# Patient Record
Sex: Female | Born: 1945 | Race: White | Hispanic: No | Marital: Single | Smoking: Never smoker
Health system: Southern US, Community
[De-identification: ages and names within clinical notes are randomized; demographics above are authoritative.]

## PROBLEM LIST (undated history)

## (undated) DIAGNOSIS — G8220 Paraplegia, unspecified: Secondary | ICD-10-CM

## (undated) DIAGNOSIS — I10 Essential (primary) hypertension: Secondary | ICD-10-CM

## (undated) DIAGNOSIS — G709 Myoneural disorder, unspecified: Secondary | ICD-10-CM

## (undated) DIAGNOSIS — Z92241 Personal history of systemic steroid therapy: Secondary | ICD-10-CM

## (undated) DIAGNOSIS — R51 Headache: Secondary | ICD-10-CM

## (undated) DIAGNOSIS — R52 Pain, unspecified: Secondary | ICD-10-CM

## (undated) DIAGNOSIS — R519 Headache, unspecified: Secondary | ICD-10-CM

## (undated) DIAGNOSIS — A809 Acute poliomyelitis, unspecified: Secondary | ICD-10-CM

## (undated) HISTORY — PX: ABDOMINAL HYSTERECTOMY: SHX81

## (undated) HISTORY — PX: LEG TENDON SURGERY: SHX1004

---

## 1998-12-05 ENCOUNTER — Emergency Department (HOSPITAL_COMMUNITY): Admission: EM | Admit: 1998-12-05 | Discharge: 1998-12-05 | Payer: Self-pay | Admitting: Emergency Medicine

## 1998-12-06 ENCOUNTER — Encounter: Payer: Self-pay | Admitting: Emergency Medicine

## 2002-10-28 ENCOUNTER — Emergency Department (HOSPITAL_COMMUNITY): Admission: EM | Admit: 2002-10-28 | Discharge: 2002-10-28 | Payer: Self-pay | Admitting: Emergency Medicine

## 2003-03-02 ENCOUNTER — Emergency Department (HOSPITAL_COMMUNITY): Admission: EM | Admit: 2003-03-02 | Discharge: 2003-03-02 | Payer: Self-pay | Admitting: Emergency Medicine

## 2003-12-27 ENCOUNTER — Emergency Department (HOSPITAL_COMMUNITY): Admission: EM | Admit: 2003-12-27 | Discharge: 2003-12-27 | Payer: Self-pay | Admitting: Emergency Medicine

## 2004-03-30 ENCOUNTER — Encounter: Admission: RE | Admit: 2004-03-30 | Discharge: 2004-06-28 | Payer: Self-pay | Admitting: Orthopedic Surgery

## 2006-03-27 ENCOUNTER — Encounter: Admission: RE | Admit: 2006-03-27 | Discharge: 2006-04-13 | Payer: Self-pay | Admitting: Geriatric Medicine

## 2006-11-13 ENCOUNTER — Encounter: Admission: RE | Admit: 2006-11-13 | Discharge: 2006-11-13 | Payer: Self-pay | Admitting: Geriatric Medicine

## 2007-11-15 ENCOUNTER — Encounter: Admission: RE | Admit: 2007-11-15 | Discharge: 2007-11-15 | Payer: Self-pay | Admitting: Geriatric Medicine

## 2008-11-17 ENCOUNTER — Encounter: Admission: RE | Admit: 2008-11-17 | Discharge: 2008-11-17 | Payer: Self-pay | Admitting: Geriatric Medicine

## 2009-11-18 ENCOUNTER — Encounter: Admission: RE | Admit: 2009-11-18 | Discharge: 2009-11-18 | Payer: Self-pay | Admitting: Geriatric Medicine

## 2010-10-08 ENCOUNTER — Other Ambulatory Visit: Payer: Self-pay | Admitting: Geriatric Medicine

## 2010-10-08 DIAGNOSIS — Z1231 Encounter for screening mammogram for malignant neoplasm of breast: Secondary | ICD-10-CM

## 2010-11-22 ENCOUNTER — Ambulatory Visit
Admission: RE | Admit: 2010-11-22 | Discharge: 2010-11-22 | Disposition: A | Discharge status: Home / Self Care | Payer: Medicare Other | Source: Ambulatory Visit | Attending: Geriatric Medicine | Admitting: Geriatric Medicine

## 2010-11-22 DIAGNOSIS — Z1231 Encounter for screening mammogram for malignant neoplasm of breast: Secondary | ICD-10-CM

## 2010-11-29 ENCOUNTER — Other Ambulatory Visit: Payer: Self-pay | Admitting: Geriatric Medicine

## 2010-11-29 DIAGNOSIS — Z1231 Encounter for screening mammogram for malignant neoplasm of breast: Secondary | ICD-10-CM

## 2011-11-23 ENCOUNTER — Ambulatory Visit
Admission: RE | Admit: 2011-11-23 | Discharge: 2011-11-23 | Disposition: A | Discharge status: Home / Self Care | Payer: Medicare Other | Source: Ambulatory Visit | Attending: Geriatric Medicine | Admitting: Geriatric Medicine

## 2011-11-23 DIAGNOSIS — Z1231 Encounter for screening mammogram for malignant neoplasm of breast: Secondary | ICD-10-CM

## 2012-01-16 ENCOUNTER — Other Ambulatory Visit: Payer: Self-pay | Admitting: Geriatric Medicine

## 2012-01-16 DIAGNOSIS — Z1231 Encounter for screening mammogram for malignant neoplasm of breast: Secondary | ICD-10-CM

## 2012-11-23 ENCOUNTER — Ambulatory Visit: Payer: Medicare Other

## 2012-12-07 ENCOUNTER — Ambulatory Visit
Admission: RE | Admit: 2012-12-07 | Discharge: 2012-12-07 | Disposition: A | Discharge status: Home / Self Care | Payer: Medicare Other | Source: Ambulatory Visit | Attending: Geriatric Medicine | Admitting: Geriatric Medicine

## 2012-12-07 DIAGNOSIS — Z1231 Encounter for screening mammogram for malignant neoplasm of breast: Secondary | ICD-10-CM

## 2013-04-08 ENCOUNTER — Other Ambulatory Visit: Payer: Self-pay

## 2013-04-08 DIAGNOSIS — Z1231 Encounter for screening mammogram for malignant neoplasm of breast: Secondary | ICD-10-CM

## 2013-11-13 ENCOUNTER — Emergency Department (HOSPITAL_COMMUNITY): Payer: Medicare HMO

## 2013-11-13 ENCOUNTER — Emergency Department (HOSPITAL_COMMUNITY)
Admission: EM | Admit: 2013-11-13 | Discharge: 2013-11-13 | Disposition: A | Discharge status: Home / Self Care | Payer: Medicare HMO | Attending: Emergency Medicine | Admitting: Emergency Medicine

## 2013-11-13 ENCOUNTER — Encounter (HOSPITAL_COMMUNITY): Payer: Self-pay | Admitting: Emergency Medicine

## 2013-11-13 DIAGNOSIS — R160 Hepatomegaly, not elsewhere classified: Secondary | ICD-10-CM | POA: Diagnosis not present

## 2013-11-13 DIAGNOSIS — Z8612 Personal history of poliomyelitis: Secondary | ICD-10-CM | POA: Insufficient documentation

## 2013-11-13 DIAGNOSIS — R109 Unspecified abdominal pain: Secondary | ICD-10-CM | POA: Diagnosis present

## 2013-11-13 DIAGNOSIS — N289 Disorder of kidney and ureter, unspecified: Secondary | ICD-10-CM | POA: Diagnosis not present

## 2013-11-13 DIAGNOSIS — N2889 Other specified disorders of kidney and ureter: Secondary | ICD-10-CM

## 2013-11-13 HISTORY — DX: Paraplegia, unspecified: G82.20

## 2013-11-13 HISTORY — DX: Acute poliomyelitis, unspecified: A80.9

## 2013-11-13 LAB — URINALYSIS, ROUTINE W REFLEX MICROSCOPIC
GLUCOSE, UA: NEGATIVE mg/dL
KETONES UR: 15 mg/dL — AB
Nitrite: NEGATIVE
PH: 5 (ref 5.0–8.0)
Protein, ur: 100 mg/dL — AB
Specific Gravity, Urine: 1.017 (ref 1.005–1.030)
UROBILINOGEN UA: 0.2 mg/dL (ref 0.0–1.0)

## 2013-11-13 LAB — CBC WITH DIFFERENTIAL/PLATELET
BASOS ABS: 0 10*3/uL (ref 0.0–0.1)
BASOS PCT: 0 % (ref 0–1)
EOS PCT: 0 % (ref 0–5)
Eosinophils Absolute: 0 10*3/uL (ref 0.0–0.7)
HEMATOCRIT: 44.3 % (ref 36.0–46.0)
Hemoglobin: 14.7 g/dL (ref 12.0–15.0)
LYMPHS PCT: 9 % — AB (ref 12–46)
Lymphs Abs: 1.2 10*3/uL (ref 0.7–4.0)
MCH: 29.9 pg (ref 26.0–34.0)
MCHC: 33.2 g/dL (ref 30.0–36.0)
MCV: 90.2 fL (ref 78.0–100.0)
MONO ABS: 0.5 10*3/uL (ref 0.1–1.0)
Monocytes Relative: 4 % (ref 3–12)
NEUTROS ABS: 11.1 10*3/uL — AB (ref 1.7–7.7)
Neutrophils Relative %: 87 % — ABNORMAL HIGH (ref 43–77)
Platelets: 236 10*3/uL (ref 150–400)
RBC: 4.91 MIL/uL (ref 3.87–5.11)
RDW: 15.1 % (ref 11.5–15.5)
WBC: 12.8 10*3/uL — ABNORMAL HIGH (ref 4.0–10.5)

## 2013-11-13 LAB — COMPREHENSIVE METABOLIC PANEL
ALBUMIN: 4.4 g/dL (ref 3.5–5.2)
ALK PHOS: 52 U/L (ref 39–117)
ALT: 19 U/L (ref 0–35)
AST: 21 U/L (ref 0–37)
Anion gap: 16 — ABNORMAL HIGH (ref 5–15)
BILIRUBIN TOTAL: 0.3 mg/dL (ref 0.3–1.2)
BUN: 15 mg/dL (ref 6–23)
CHLORIDE: 102 meq/L (ref 96–112)
CO2: 26 meq/L (ref 19–32)
Calcium: 9.5 mg/dL (ref 8.4–10.5)
Creatinine, Ser: 0.58 mg/dL (ref 0.50–1.10)
GFR calc Af Amer: >90 mL/min (ref >90)
GFR calc non Af Amer: >90 mL/min (ref >90)
GLUCOSE: 162 mg/dL — AB (ref 70–99)
Potassium: 4.1 mEq/L (ref 3.7–5.3)
SODIUM: 144 meq/L (ref 137–147)
TOTAL PROTEIN: 7.4 g/dL (ref 6.0–8.3)

## 2013-11-13 LAB — URINE MICROSCOPIC-ADD ON

## 2013-11-13 MED ORDER — DOCUSATE SODIUM 100 MG PO CAPS: 100.0000 mg | ORAL_CAPSULE | Freq: Two times a day (BID) | ORAL | Status: DC

## 2013-11-13 MED ORDER — MORPHINE SULFATE 4 MG/ML IJ SOLN
4.0000 mg | Freq: Once | INTRAMUSCULAR | Status: AC
  Administered 2013-11-13: 4 mg via INTRAVENOUS
  Filled 2013-11-13: qty 1

## 2013-11-13 MED ORDER — OXYCODONE-ACETAMINOPHEN 5-325 MG PO TABS: 1.0000 | ORAL_TABLET | ORAL | Status: DC | PRN

## 2013-11-13 MED ORDER — IOHEXOL 300 MG/ML  SOLN
75.0000 mL | Freq: Once | INTRAMUSCULAR | Status: AC | PRN
  Administered 2013-11-13: 75 mL via INTRAVENOUS

## 2013-11-13 MED ORDER — ONDANSETRON HCL 4 MG/2ML IJ SOLN
4.0000 mg | Freq: Once | INTRAMUSCULAR | Status: AC
  Administered 2013-11-13: 4 mg via INTRAVENOUS
  Filled 2013-11-13: qty 2

## 2013-11-13 MED ORDER — KETOROLAC TROMETHAMINE 30 MG/ML IJ SOLN
30.0000 mg | Freq: Once | INTRAMUSCULAR | Status: AC
  Administered 2013-11-13: 30 mg via INTRAVENOUS
  Filled 2013-11-13: qty 1

## 2013-11-13 MED ORDER — IOHEXOL 300 MG/ML  SOLN: 50.0000 mL | Freq: Once | INTRAMUSCULAR | Status: DC | PRN

## 2013-11-13 NOTE — ED Notes (Signed)
MD at bedside. 

## 2013-11-13 NOTE — ED Notes (Signed)
[name] [phone]

## 2013-11-13 NOTE — ED Provider Notes (Signed)
CSN: [id number]     Arrival date & time 11/13/13  0304 History   First MD Initiated Contact with Patient 11/13/13 [phone]     Chief Complaint  Patient presents with  . Abdominal Pain  . Flank Pain     (Consider location/radiation/quality/duration/timing/severity/associated sxs/prior Treatment) HPI 68 year old female presents to the emergency department from home via EMS with complaint of right-sided abdominal pain and back pain.  Pain started last night, and is gradually worsened.  Patient received fentanyl in route which is not helped.  Patient has nausea but no vomiting.  No prior history of same.  She denies any dysuria.  Patient has history of polio.  Patient status post abdominal hysterectomy, reports she still has one ovary.  No other surgeries.  No prior history of kidney stones.  Pain is sharp and knifelike and comes in waves. Past Medical History  Diagnosis Date  . Polio    Past Surgical History  Procedure Laterality Date  . Abdominal hysterectomy     No family history on file. History  Substance Use Topics  . Smoking status: Never Smoker   . Smokeless tobacco: Not on file  . Alcohol Use: Not on file   OB History   Grav Para Term Preterm Abortions TAB SAB Ect Mult Living                 Review of Systems  See History of Present Illness; otherwise all other systems are reviewed and negative   Allergies  Review of patient's allergies indicates no known allergies.  Home Medications   Prior to Admission medications   Medication Sig Start Date End Date Taking? Authorizing Provider  alendronate (FOSAMAX) 70 MG tablet Take 70 mg by mouth once a week. On Thursday 08/22/13  Yes Historical Provider, MD   BP 157/96  Pulse 68  Temp(Src) 97.4 F (36.3 C) (Oral)  Resp 18  Ht 4\' 10"  (1.473 m)  Wt 120 lb (54.432 kg)  BMI 25.09 kg/m2  SpO2 95% Physical Exam  Constitutional: She is oriented to person, place, and time. She appears well-developed and well-nourished. She  appears distressed.  HENT:  Head: Normocephalic and atraumatic.  Nose: Nose normal.  Mouth/Throat: Oropharynx is clear and moist.  Eyes: Conjunctivae and EOM are normal. Pupils are equal, round, and reactive to light.  Neck: Normal range of motion. Neck supple. No JVD present. No tracheal deviation present. No thyromegaly present.  Cardiovascular: Normal rate, regular rhythm, normal heart sounds and intact distal pulses.  Exam reveals no gallop and no friction rub.   No murmur heard. Pulmonary/Chest: Effort normal and breath sounds normal. No stridor. No respiratory distress. She has no wheezes. She has no rales. She exhibits no tenderness.  Abdominal: Soft. Bowel sounds are normal. She exhibits no distension and no mass. There is tenderness (patient has tenderness throughout the right abdomen, worse in right upper and right lower quadrants.  She also has positive CVA tenderness.). There is no rebound and no guarding.  Musculoskeletal: Normal range of motion. She exhibits no edema and no tenderness.  Lymphadenopathy:    She has no cervical adenopathy.  Neurological: She is alert and oriented to person, place, and time. She displays normal reflexes. She exhibits normal muscle tone. Coordination normal.  Skin: Skin is warm and dry. No rash noted. No erythema. No pallor.  Psychiatric: She has a normal mood and affect. Her behavior is normal. Judgment and thought content normal.    ED Course  Procedures (including critical  care time) Labs Review Labs Reviewed  CBC WITH DIFFERENTIAL - Abnormal; Notable for the following:    WBC 12.8 (*)    Neutrophils Relative % 87 (*)    Neutro Abs 11.1 (*)    Lymphocytes Relative 9 (*)    All other components within normal limits  COMPREHENSIVE METABOLIC PANEL - Abnormal; Notable for the following:    Glucose, Bld 162 (*)    Anion gap 16 (*)    All other components within normal limits  URINALYSIS, ROUTINE W REFLEX MICROSCOPIC - Abnormal; Notable for  the following:    Color, Urine RED (*)    APPearance TURBID (*)    Hgb urine dipstick LARGE (*)    Bilirubin Urine MODERATE (*)    Ketones, ur 15 (*)    Protein, ur 100 (*)    Leukocytes, UA SMALL (*)    All other components within normal limits  URINE MICROSCOPIC-ADD ON    Imaging Review No results found.   EKG Interpretation None      MDM   Final diagnoses:  Right sided abdominal pain    68 year old female with right-sided abdominal pain.  Differential is wide, pyelonephritis, nephrolithiasis, cholecystitis, cholelithiasis, appendicitis.  Patient's exam does not seem indicate one diagnosis or another.  She is unable to give [place] a urine sample at this time.  Plan for labs, CT abdomen and pelvis.  7:09 AM Pt with significant blood in urine, most likely having renal colic.  CT scan pending.  Pain improved, will try toradol.  Kalman Drape, MD 11/13/13 [phone]

## 2013-11-13 NOTE — ED Notes (Signed)
Notified CT that pt finished contrast 

## 2013-11-13 NOTE — ED Provider Notes (Signed)
Patient CT scan shows concern for 2 masses. One is in the right lobe of liver and one is in the right kidney. This is likely where her hematuria is coming from. Otherwise the cause of her exact flank pain is unknown. She is currently comfortable at this time will be treated with oral pain medicines. I discussed her case with the oncologist on call, Dr. Marin Olp, will have his office call her as soon as possible set up close follow-up and will need biopsies and/or PET scan. I discussed these results with the patient and answered her questions of the bedside to the best of my ability. Re: Dresser CT scan there is some concern for constipation. Patient says she had a normal bowel movement yesterday and thus my suspicion for impaction is quite low. Given that I will be prescribing narcotics I will put her on stool softeners and cautioned her on possible constipation.  Ephraim Hamburger, MD 11/13/13 [phone]

## 2013-11-13 NOTE — ED Notes (Signed)
Patient transported to CT 

## 2013-11-13 NOTE — ED Notes (Signed)
Pt arrives from home via [facility]. Pt c/o RLQ to flank pain and lower back pain 5/10 that started last night. Pt is paraplegic. Pt has hx of polio. 131mcg fentanyl given at 0258. 18G L hand.

## 2013-11-13 NOTE — Discharge Instructions (Signed)

## 2013-11-15 ENCOUNTER — Encounter: Payer: Self-pay | Admitting: Family

## 2013-11-15 ENCOUNTER — Other Ambulatory Visit: Payer: Commercial Managed Care - HMO | Admitting: Lab

## 2013-11-15 ENCOUNTER — Ambulatory Visit (HOSPITAL_BASED_OUTPATIENT_CLINIC_OR_DEPARTMENT_OTHER): Payer: Commercial Managed Care - HMO | Admitting: Family

## 2013-11-15 VITALS — BP 154/77 | HR 96 | Temp 98.0°F | Resp 20 | Ht <= 58 in | Wt 136.0 lb

## 2013-11-15 DIAGNOSIS — N2889 Other specified disorders of kidney and ureter: Secondary | ICD-10-CM

## 2013-11-15 DIAGNOSIS — R160 Hepatomegaly, not elsewhere classified: Secondary | ICD-10-CM

## 2013-11-15 MED ORDER — PREDNISONE 20 MG PO TABS: 40.0000 mg | ORAL_TABLET | Freq: Every day | ORAL | Status: DC

## 2013-11-15 NOTE — Progress Notes (Signed)
Hematology/Oncology Consultation   Name: Marie Thomas      MRN: [id number]    Location: Room/bed info not found  Date: 11/15/2013 Time:5:03 PM   REFERRING PHYSICIAN:  Linton Flemings  REASON FOR CONSULT:  Mass on right Kidney and liver    DIAGNOSIS: Mass on right Kidney and liver   HISTORY OF PRESENT ILLNESS:  Marie Thomas is a very pleasant 68 yo female. She woke up in pain Wednesday night and went to the ER. A CT done there showed a 4.2 cm mass on her liver and also a 2.7 mass on her right kidney. She is having some pain in her right flank. She is on oxycodone for this and it does help. She has no problems making urine. She has no personal cancer history or history of a blood disorder. Her mother had breast cancer, sister had endometrial cancer and brother had prostate cancer. She had a hysterectomy years ago. She does not smoke or drink alcohol. 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  She denies fever, chills, n/v, cough, rash, headache, dizziness, SOB, chest pain, palpitations, abdominal pain, constipation, diarrhea, blood in urine or stool. No swelling, tenderness, numbness or tingling in her extremities. Her appetite is good and she drinks plenty of fluids. Her weight has been stable.    ROS: All other 10 point review of systems is negative.   PAST MEDICAL HISTORY:   Past Medical History  Diagnosis Date  . Polio   . Paraplegia     ALLERGIES: No Known Allergies    MEDICATIONS:  Current Outpatient Prescriptions on File Prior to Visit  Medication Sig Dispense Refill  . alendronate (FOSAMAX) 70 MG tablet Take 70 mg by mouth once a week. On Thursday      . docusate sodium (COLACE) 100 MG capsule Take 1 capsule (100 mg total) by mouth every 12 (twelve) hours.  60 capsule  0  . oxyCODONE-acetaminophen (PERCOCET) 5-325 MG per tablet Take 1 tablet by mouth every 4 (four) hours as needed for severe pain.  20 tablet  0   No current facility-administered medications on file  prior to visit.     PAST SURGICAL HISTORY Past Surgical History  Procedure Laterality Date  . Abdominal hysterectomy     FAMILY HISTORY: No family history on file.  SOCIAL HISTORY:  reports that she has never smoked. She does not have any smokeless tobacco history on file. Her alcohol and drug histories are not on file.  PERFORMANCE STATUS: The patient's performance status is 1 - Symptomatic but completely ambulatory  PHYSICAL EXAM: Most Recent Vital Signs: Blood pressure 154/77, pulse 96, temperature 98 F (36.7 C), resp. rate 20, height 4\' 10"  (1.473 m), weight 136 lb (61.689 kg). BP 154/77  Pulse 96  Temp(Src) 98 F (36.7 C)  Resp 20  Ht 4\' 10"  (1.473 m)  Wt 136 lb (61.689 kg)  BMI 28.43 kg/m2  General Appearance:    Alert, cooperative, no distress, appears stated age  Head:    Normocephalic, without obvious abnormality, atraumatic  Eyes:    PERRL, conjunctiva/corneas clear, EOM's intact, fundi    benign, both eyes        Throat:   Lips, mucosa, and tongue normal; teeth and gums normal  Neck:   Supple, symmetrical, trachea midline, no adenopathy;    thyroid:  no enlargement/tenderness/nodules; no carotid   bruit or JVD  Back:     Symmetric, no curvature, ROM normal, no CVA tenderness  Lungs:  Clear to auscultation bilaterally, respirations unlabored  Chest Wall:    No tenderness or deformity   Heart:    Regular rate and rhythm, S1 and S2 normal, no murmur, rub   or gallop     Abdomen:     Soft, non-tender, bowel sounds active all four quadrants,    no masses, no organomegaly        Extremities:   Extremities normal, atraumatic, no cyanosis or edema  Pulses:   2+ and symmetric all extremities  Skin:   Skin color, texture, turgor normal, no rashes or lesions  Lymph nodes:   Cervical, supraclavicular, and axillary nodes normal  Neurologic:   CNII-XII intact, normal strength, sensation and reflexes    throughout   LABORATORY DATA:  No results found for this  or any previous visit (from the past 48 hour(s)).    RADIOGRAPHY: No results found.     PATHOLOGY: None  ASSESSMENT/PLAN: Marie Thomas is a very pleasant 68 yo female. She woke up in pain Wednesday night and went to the ER. A CT done there showed a 4.2 cm mass on her liver and also a 2.7 mass on her right kidney. She does have some pain in her right flank. We will start her on Prednisone 40 mg daily to help with inflammation and pain.   We will wait and see what her labs show.  We will get an MRI of the abdomen next week.  She will also eventually need a biopsy. We will wait for the lab and MRI results before scheduling a follow-up.  All questions were answered. She knows to call the clinic with any problems, questions or concerns. We can certainly see her much sooner if necessary. The patient was discussed with and also seen by Dr. Marin Olp and he is in agreement with the aforementioned.   [facility]   Addendum:  I saw and examined the patient with Sarah. She is awful nice. She does have polio.  One would have to suspect that she has a primary renal cell carcinoma. Is still not clear as to whether or not she has metastasis to the liver.  I'll review the CT scan with the patient and her sisters. Again, they are all very nice. Back she's having pain over on her right side. I think this might be from what is going on with the liver.  I think an MRI would be helpful. This can look at the kidney and liver in greater detail.  I suspect that we then probably would go after the liver lesion. I think this could be biopsied.  I don't see need for a right now. I don't see a need for any additional blood work on her.  When I examined her, I cannot feel any adenopathy. There is no enlarged liver. She had no fluid wave. She had no rashes.  We spent a good 76minutes with she and her daughters. I explained to them what I felt was going on. This does look like a primary malignancy. If she  only has the isolated liver metastases, one might make a case for resecting out the kidney primary now also the hepatic metastasis.  We will try to take care of everything over the phone right now. We will get her back once we have a confirmed diagnosis of malignancy.

## 2013-11-27 ENCOUNTER — Other Ambulatory Visit: Payer: Self-pay | Admitting: Hematology & Oncology

## 2013-11-27 ENCOUNTER — Ambulatory Visit (HOSPITAL_COMMUNITY)
Admission: RE | Admit: 2013-11-27 | Discharge: 2013-11-27 | Disposition: A | Discharge status: Home / Self Care | Payer: Medicare PPO | Source: Ambulatory Visit | Attending: Family | Admitting: Family

## 2013-11-27 ENCOUNTER — Other Ambulatory Visit: Payer: Self-pay | Admitting: Family

## 2013-11-27 DIAGNOSIS — N9489 Other specified conditions associated with female genital organs and menstrual cycle: Secondary | ICD-10-CM | POA: Insufficient documentation

## 2013-11-27 DIAGNOSIS — R160 Hepatomegaly, not elsewhere classified: Secondary | ICD-10-CM

## 2013-11-27 DIAGNOSIS — N2889 Other specified disorders of kidney and ureter: Secondary | ICD-10-CM | POA: Diagnosis present

## 2013-11-27 MED ORDER — GADOBENATE DIMEGLUMINE 529 MG/ML IV SOLN
12.0000 mL | Freq: Once | INTRAVENOUS | Status: AC | PRN
  Administered 2013-11-27: 12 mL via INTRAVENOUS

## 2013-11-28 ENCOUNTER — Telehealth: Payer: Self-pay | Admitting: Hematology & Oncology

## 2013-11-28 NOTE — Telephone Encounter (Signed)
Biopsy under review in scheduling

## 2013-12-02 ENCOUNTER — Other Ambulatory Visit: Payer: Self-pay | Admitting: *Deleted

## 2013-12-02 DIAGNOSIS — C649 Malignant neoplasm of unspecified kidney, except renal pelvis: Secondary | ICD-10-CM | POA: Insufficient documentation

## 2013-12-04 ENCOUNTER — Other Ambulatory Visit: Payer: Self-pay | Admitting: Radiology

## 2013-12-05 ENCOUNTER — Encounter (HOSPITAL_COMMUNITY): Payer: Self-pay

## 2013-12-05 ENCOUNTER — Ambulatory Visit (HOSPITAL_COMMUNITY)
Admission: RE | Admit: 2013-12-05 | Discharge: 2013-12-05 | Disposition: A | Discharge status: Home / Self Care | Payer: Medicare PPO | Source: Ambulatory Visit | Attending: Hematology & Oncology | Admitting: Hematology & Oncology

## 2013-12-05 DIAGNOSIS — Z79899 Other long term (current) drug therapy: Secondary | ICD-10-CM | POA: Insufficient documentation

## 2013-12-05 DIAGNOSIS — K769 Liver disease, unspecified: Secondary | ICD-10-CM | POA: Diagnosis present

## 2013-12-05 DIAGNOSIS — Z90710 Acquired absence of both cervix and uterus: Secondary | ICD-10-CM | POA: Diagnosis not present

## 2013-12-05 DIAGNOSIS — Z719 Counseling, unspecified: Secondary | ICD-10-CM | POA: Insufficient documentation

## 2013-12-05 DIAGNOSIS — C787 Secondary malignant neoplasm of liver and intrahepatic bile duct: Secondary | ICD-10-CM | POA: Diagnosis not present

## 2013-12-05 DIAGNOSIS — A809 Acute poliomyelitis, unspecified: Secondary | ICD-10-CM | POA: Insufficient documentation

## 2013-12-05 DIAGNOSIS — G8220 Paraplegia, unspecified: Secondary | ICD-10-CM | POA: Diagnosis not present

## 2013-12-05 DIAGNOSIS — C649 Malignant neoplasm of unspecified kidney, except renal pelvis: Secondary | ICD-10-CM

## 2013-12-05 DIAGNOSIS — C641 Malignant neoplasm of right kidney, except renal pelvis: Secondary | ICD-10-CM | POA: Insufficient documentation

## 2013-12-05 DIAGNOSIS — N289 Disorder of kidney and ureter, unspecified: Secondary | ICD-10-CM | POA: Diagnosis present

## 2013-12-05 DIAGNOSIS — R109 Unspecified abdominal pain: Secondary | ICD-10-CM | POA: Diagnosis present

## 2013-12-05 LAB — CBC WITH DIFFERENTIAL/PLATELET
BASOS ABS: 0 10*3/uL (ref 0.0–0.1)
Basophils Relative: 0 % (ref 0–1)
EOS PCT: 1 % (ref 0–5)
Eosinophils Absolute: 0.1 10*3/uL (ref 0.0–0.7)
HCT: 43.8 % (ref 36.0–46.0)
Hemoglobin: 14.2 g/dL (ref 12.0–15.0)
Lymphocytes Relative: 23 % (ref 12–46)
Lymphs Abs: 2 10*3/uL (ref 0.7–4.0)
MCH: 29 pg (ref 26.0–34.0)
MCHC: 32.4 g/dL (ref 30.0–36.0)
MCV: 89.6 fL (ref 78.0–100.0)
MONO ABS: 0.7 10*3/uL (ref 0.1–1.0)
Monocytes Relative: 7 % (ref 3–12)
Neutro Abs: 6.1 10*3/uL (ref 1.7–7.7)
Neutrophils Relative %: 69 % (ref 43–77)
PLATELETS: 262 10*3/uL (ref 150–400)
RBC: 4.89 MIL/uL (ref 3.87–5.11)
RDW: 15.4 % (ref 11.5–15.5)
WBC: 8.9 10*3/uL (ref 4.0–10.5)

## 2013-12-05 LAB — PROTIME-INR
INR: 1.02 (ref 0.00–1.49)
PROTHROMBIN TIME: 13.5 s (ref 11.6–15.2)

## 2013-12-05 LAB — APTT: aPTT: 24 seconds (ref 24–37)

## 2013-12-05 MED ORDER — FENTANYL CITRATE 0.05 MG/ML IJ SOLN
INTRAMUSCULAR | Status: AC | PRN
  Administered 2013-12-05 (×2): 25 ug via INTRAVENOUS

## 2013-12-05 MED ORDER — HYDROCODONE-ACETAMINOPHEN 5-325 MG PO TABS
ORAL_TABLET | ORAL | Status: AC
  Filled 2013-12-05: qty 2

## 2013-12-05 MED ORDER — MIDAZOLAM HCL 2 MG/2ML IJ SOLN
INTRAMUSCULAR | Status: AC | PRN
  Administered 2013-12-05 (×2): 0.5 mg via INTRAVENOUS

## 2013-12-05 MED ORDER — LIDOCAINE HCL (PF) 1 % IJ SOLN
INTRAMUSCULAR | Status: AC
  Filled 2013-12-05: qty 10

## 2013-12-05 MED ORDER — SODIUM CHLORIDE 0.9 % IV SOLN
INTRAVENOUS | Status: DC
  Administered 2013-12-05: 09:00:00 via INTRAVENOUS

## 2013-12-05 MED ORDER — FENTANYL CITRATE 0.05 MG/ML IJ SOLN
INTRAMUSCULAR | Status: AC
  Filled 2013-12-05: qty 4

## 2013-12-05 MED ORDER — GELATIN ABSORBABLE 12-7 MM EX MISC
CUTANEOUS | Status: AC
  Filled 2013-12-05: qty 1

## 2013-12-05 MED ORDER — MIDAZOLAM HCL 2 MG/2ML IJ SOLN
INTRAMUSCULAR | Status: AC
  Filled 2013-12-05: qty 4

## 2013-12-05 MED ORDER — HYDROCODONE-ACETAMINOPHEN 5-325 MG PO TABS
1.0000 | ORAL_TABLET | ORAL | Status: DC | PRN
Start: 2013-12-05 — End: 2013-12-06
  Administered 2013-12-05: 2 via ORAL

## 2013-12-05 NOTE — Sedation Documentation (Signed)
Procedure complete, pt c/o pressure to the site. 5/10. Gel foam packed in to site by MD. Pt made aware the pressure is normal but if it feeling increases she is let the Short stay RN know.

## 2013-12-05 NOTE — H&P (Signed)
Chief Complaint: New liver and renal lesion abd pain  Referring Physician(s): Ennever,Peter R  History of Present Illness: Marie Thomas is a 67 y.o. female  Pt developed abdominal pain approx 1 month ago CT revealed Rt renal mass and liver lesion MRI 11/27/13 shows Rt renal mass consistent with renal cell carcinoma and liver metstasis Now scheduled for liver lesion bx Hx polio since 9 mo age   Past Medical History  Diagnosis Date  . Polio   . Paraplegia     Past Surgical History  Procedure Laterality Date  . Abdominal hysterectomy      Allergies: Review of patient's allergies indicates no known allergies.  Medications: Prior to Admission medications   Medication Sig Start Date End Date Taking? Authorizing Provider  alendronate (FOSAMAX) 70 MG tablet Take 70 mg by mouth once a week. On Thursday 08/22/13  Yes Historical Provider, MD  Cholecalciferol (VITAMIN D) 2000 UNITS tablet Take 2,000 Units by mouth daily.   Yes Historical Provider, MD  docusate sodium (COLACE) 100 MG capsule Take 1 capsule (100 mg total) by mouth every 12 (twelve) hours. 11/13/13  Yes Ephraim Hamburger, MD  oxyCODONE-acetaminophen (PERCOCET) 5-325 MG per tablet Take 1 tablet by mouth every 4 (four) hours as needed for severe pain. 11/13/13  Yes Ephraim Hamburger, MD  predniSONE (DELTASONE) 20 MG tablet Take 2 tablets (40 mg total) by mouth daily with breakfast. 11/15/13  Yes Eliezer Bottom, NP    History reviewed. No pertinent family history.  History   Social History  . Marital Status: Single    Spouse Name: N/A    Number of Children: N/A  . Years of Education: N/A   Social History Main Topics  . Smoking status: Never Smoker   . Smokeless tobacco: None  . Alcohol Use: None  . Drug Use: None  . Sexual Activity: None   Other Topics Concern  . None   Social History Narrative     Review of Systems: A 12 point ROS discussed and pertinent positives are indicated in the HPI  above.  All other systems are negative.  Review of Systems  Constitutional: Negative for activity change, appetite change and unexpected weight change.  Respiratory: Negative for cough and shortness of breath.   Cardiovascular: Negative for chest pain.  Gastrointestinal: Positive for abdominal pain.  Genitourinary: Negative for difficulty urinating.  Musculoskeletal: Positive for gait problem.  Psychiatric/Behavioral: Negative for behavioral problems and confusion.    Vital Signs: BP 157/71 mmHg  Pulse 92  Temp(Src) 97.4 F (36.3 C) (Oral)  Resp 18  Ht 4\' 8"  (1.422 m)  Wt 61.236 kg (135 lb)  BMI 30.28 kg/m2  SpO2 100%  Physical Exam  Constitutional: She is oriented to person, place, and time. She appears well-nourished.  Cardiovascular: Normal rate, regular rhythm and normal heart sounds.   No murmur heard. Pulmonary/Chest: Effort normal and breath sounds normal. She has no wheezes.  Abdominal: Soft. Bowel sounds are normal. There is no tenderness.  Musculoskeletal: Normal range of motion.  Polio hx since 33 months old B lower extr deformed without use  Neurological: She is alert and oriented to person, place, and time.  Skin: Skin is warm and dry.  Psychiatric: She has a normal mood and affect. Her behavior is normal. Judgment and thought content normal.  Nursing note and vitals reviewed.   Imaging: Mr Abdomen Moise Boring Contrast  11/27/2013   ADDENDUM REPORT: 11/27/2013 12:16  ADDENDUM: Dictation Error:  Right Kidney  mass not left - correction below  IMPRESSION: 1. Enhancing mass lesion in the posterior mid RIGHT kidney is consists with renal cell carcinoma.   Electronically Signed   By: Suzy Bouchard M.D.   On: 11/27/2013 12:16   11/27/2013   CLINICAL DATA:  Indeterminate renal lesion on CT. MRI recommended for further characterization.  EXAM: MRI ABDOMEN WITHOUT AND WITH CONTRAST  TECHNIQUE: Multiplanar multisequence MR imaging of the abdomen was performed both before and  after the administration of intravenous contrast.  CONTRAST:  64mL MULTIHANCE GADOBENATE DIMEGLUMINE 529 MG/ML IV SOLN  COMPARISON:  CT 11/13/2013  FINDINGS: Renal: There is enhancing rounded lesion within the posterior mid right renal cortex measuring 25 x 20 mm (image 53, series 1302). This lesion demonstrates heterogeneous early enhancement on series 1301. No additional enhancing lesion in the right kidney. This renal mass isremoved from the right renal hilum. There is no retroperitoneal lymphadenopathy. No extension beyond the para renal fascia. The right renal vein is normal.  There are bilateral parapelvic cysts in  Within the inferior aspect of the right hepatic lobe (segment 5) there is a lobular lesion measuring 3 .1 x 2.8 cm (image 42, series 1302). This lesion is primarily nonenhancing and relatively low signal intensity on T1 and T2 weighted imaging. On the most delayed sequence (5 minutes) there is some delayed enhancement. There is a small nonenhancing cysts within the left hepatic lobe measuring 8 mm (image 20, series 1). No biliary duct dilatation. Portal veins are patent.  The pancreas is normal.  The spleen and adrenal glands normal.  The stomach and limited view of the small bowel colon are unremarkable. No retroperitoneal periportal lymphadenopathy  IMPRESSION: 1. Enhancing mass lesion in the the posterior mid left kidney is consists with renal cell carcinoma. 2. No evidence of extension beyond the right perirenal fascia and no evidence involvement of the right renal vein. 3. Lobular lesion within the right hepatic lobe with delayed enhancement suggests a fibrotic process and may relate to prior trauma or infection. Lesion does not have an typical appearance of a metastasis or primary hepatic neoplasm. In patient without history of surgery, this lesion likely warrants biopsy or at minimum a follow-up contrast CT or MRI in 3 months. These results will be called to the ordering clinician or  representative by the Radiologist Assistant, and communication documented in the PACS or zVision Dashboard.  Electronically Signed: By: Suzy Bouchard M.D. On: 11/27/2013 11:59   Ct Abdomen Pelvis W Contrast  11/13/2013   CLINICAL DATA:  Acute right lower quadrant abdominal pain.  EXAM: CT ABDOMEN AND PELVIS WITH CONTRAST  TECHNIQUE: Multidetector CT imaging of the abdomen and pelvis was performed using the standard protocol following bolus administration of intravenous contrast.  CONTRAST:  60mL OMNIPAQUE IOHEXOL 300 MG/ML  SOLN  COMPARISON:  None.  FINDINGS: Minimal posterior basilar subsegmental atelectasis is noted. No acute osseous abnormality is noted.  No gallstones are noted. 4.2 x 2.7 cm irregular low density is noted in the right hepatic lobe adjacent to the gallbladder fossa with central calcifications. This is concerning for possible hepatic neoplasm. Further evaluation with MRI is recommended. The spleen and pancreas appear normal. Adrenal glands appear normal. Bilateral parapelvic cysts are noted in the kidneys. No hydronephrosis or renal obstruction is noted. 2.7 x 2.5 x 2.4 cm enhancing solid mass is seen arising from the upper pole of the right kidney concerning for renal cell carcinoma. The appendix appears normal. There is no evidence of  bowel obstruction. Sigmoid diverticulosis is noted without inflammation. Urinary bladder appears normal. No abnormal fluid collection is noted. No significant adenopathy is noted.  Atherosclerotic calcifications of abdominal aorta are noted without aneurysm formation. 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 Large amount of stool is noted in the rectum which may represent impaction.  IMPRESSION: 4.2 cm low density abnormality is seen in the right hepatic lobe adjacent to gallbladder fossa with central calcifications concerning for hepatic neoplasm. Further evaluation with MRI is recommended.  2.7 cm enhancing solid mass is seen in  upper pole of right kidney concerning for renal cell carcinoma.  No hydronephrosis or renal obstruction is noted.  Sigmoid diverticulosis is noted without inflammation.  The appendix appears normal.  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  Large amount of stool seen in rectum which may represent impaction.   Electronically Signed   By: Sabino Dick M.D.   On: 11/13/2013 07:41    Labs:  CBC:  Recent Labs  11/13/13 0337  WBC 12.8*  HGB 14.7  HCT 44.3  PLT 236    COAGS: No results for input(s): INR, APTT in the last 8760 hours.  BMP:  Recent Labs  11/13/13 0337  NA 144  K 4.1  CL 102  CO2 26  GLUCOSE 162*  BUN 15  CALCIUM 9.5  CREATININE 0.58  GFRNONAA >90  GFRAA >90    LIVER FUNCTION TESTS:  Recent Labs  11/13/13 0337  BILITOT 0.3  AST 21  ALT 19  ALKPHOS 52  PROT 7.4  ALBUMIN 4.4    TUMOR MARKERS: No results for input(s): AFPTM, CEA, CA199, CHROMGRNA in the last 8760 hours.  Assessment and Plan:  New rt renal mass-- renal cell carcinoma Liver lesion mets Now scheduled for liver lesion bx Pt aware of procedure benefits and risks and agreeable to proceed Consent signed andin chart  Thank you for this interesting consult.  I greatly enjoyed meeting JAHLIYAH TRICE and look forward to participating in their care.    I spent a total of 20 minutes face to face in clinical consultation, greater than 50% of which was counseling/coordinating care for liver lesion biopsy  Signed: Cherika Jessie A 12/05/2013, 9:02 AM

## 2013-12-05 NOTE — Discharge Instructions (Signed)
Liver Biopsy, Care After °These instructions give you information on caring for yourself after your procedure. Your doctor may also give you more specific instructions. Call your doctor if you have any problems or questions after your procedure. °HOME CARE °· Rest at home for 1-2 days or as told by your doctor. °· Have someone stay with you for at least 24 hours. °· Do not do these things in the first 24 hours: °¨ Drive. °¨ Use machinery. °¨ Take care of other people. °¨ Sign legal documents. °¨ Take a bath or shower. °· There are many different ways to close and cover a cut (incision). For example, a cut can be closed with stitches, skin glue, or adhesive strips. Follow your doctor's instructions on: °¨ Taking care of your cut. °¨ Changing and removing your bandage (dressing). °¨ Removing whatever was used to close your cut. °· Do not drink alcohol in the first week. °· Do not lift more than 5 pounds or play contact sports for the first 2 weeks. °· Take medicines only as told by your doctor. For 1 week, do not take medicine that has aspirin in it or medicines like ibuprofen. °· Get your test results. °GET HELP IF: °· A cut bleeds and leaves more than just a small spot of blood. °· A cut is red, puffs up (swells), or hurts more than before. °· Fluid or something else comes from a cut. °· A cut smells bad. °· You have a fever or chills. °GET HELP RIGHT AWAY IF: °· You have swelling, bloating, or pain in your belly (abdomen). °· You get dizzy or faint. °· You have a rash. °· You feel sick to your stomach (nauseous) or throw up (vomit). °· You have trouble breathing, feel short of breath, or feel faint. °· Your chest hurts. °· You have problems talking or seeing. °· You have trouble balancing or moving your arms or legs. °Document Released: 10/13/2007 Document Revised: 05/20/2013 Document Reviewed: 03/01/2013 °[employer]® Patient Information ©2015 [employer]. This information is not intended to replace advice given to  you by your health care provider. Make sure you discuss any questions you have with your health care provider. ° °

## 2013-12-05 NOTE — Sedation Documentation (Signed)
Patient denies pain and is resting comfortably.  

## 2013-12-05 NOTE — Sedation Documentation (Addendum)
Just prior to transfer to short stay patient stated had pressure in incision area that went from initial slight to 5/10 to 6/10. [place] tech checked area with [place] and no bleeding noted. Dr Pascal Lux aware and will order pain medication for patient. Tonya in Short Stay aware of this.

## 2013-12-05 NOTE — Procedures (Signed)
Technically successful US guided biopsy of indeterminate liver lesion.  No immediate complications.

## 2013-12-09 ENCOUNTER — Ambulatory Visit
Admission: RE | Admit: 2013-12-09 | Discharge: 2013-12-09 | Disposition: A | Discharge status: Home / Self Care | Payer: Commercial Managed Care - HMO | Source: Ambulatory Visit

## 2013-12-09 ENCOUNTER — Ambulatory Visit: Payer: Medicare Other

## 2013-12-09 DIAGNOSIS — Z1231 Encounter for screening mammogram for malignant neoplasm of breast: Secondary | ICD-10-CM

## 2013-12-11 ENCOUNTER — Other Ambulatory Visit: Payer: Self-pay | Admitting: Hematology & Oncology

## 2013-12-11 DIAGNOSIS — N2889 Other specified disorders of kidney and ureter: Secondary | ICD-10-CM

## 2014-01-02 ENCOUNTER — Ambulatory Visit
Admission: RE | Admit: 2014-01-02 | Discharge: 2014-01-02 | Disposition: A | Discharge status: Home / Self Care | Payer: Commercial Managed Care - HMO | Source: Ambulatory Visit | Attending: Hematology & Oncology | Admitting: Hematology & Oncology

## 2014-01-02 DIAGNOSIS — N2889 Other specified disorders of kidney and ureter: Secondary | ICD-10-CM

## 2014-01-02 NOTE — Consult Note (Signed)
Chief Complaint: Chief Complaint  Patient presents with  . Advice Only    Referring Physician(s): Ennever,Peter R  History of Present Illness: Marie Thomas is a 68 y.o. female with a past medical history significant for polio and paraplegia since infancy. Otherwise, she has been in relatively good health until approximately 2 months ago when she developed nonspecific right lower quadrant pain. Imaging workup at that time demonstrated an enhancing 2.5 cm lesion in the posterior aspect of the upper pole of the right kidney consistent with a renal cell carcinoma. Additionally, a nonspecific hepatic lesion was identified. The patient underwent biopsy of the hepatic lesion which returned only benign parenchymal tissue. The appearance of this lesion was not particularly concerning on MR and so this is felt to be concordant.  Marie Thomas has been treated with a combination of laxatives, prednisone and pain medications with adequate relief of her right lower quadrant pain.  She presents today to discuss her right renal neoplasm. Currently, she is asymptomatic. She denies any right flank pain, hematuria or dysuria.  She is accompanied by her brother.  She denies fever, weight loss, chills or night sweats.  Her hepatic biopsy was uncomplicated, she has no symptoms or problems related to that to recent procedure.  She has done very well with her paraplegia throughout her life. She does note that it is difficult for her to lay flat for extended periods of time due to her condition. She uses a wheelchair nearly 100% of the time.  Past Medical History  Diagnosis Date  . Polio   . Paraplegia     Past Surgical History  Procedure Laterality Date  . Abdominal hysterectomy      Allergies: Review of patient's allergies indicates no known allergies.  Medications: Prior to Admission medications   Medication Sig Start Date End Date Taking? Authorizing Provider  alendronate (FOSAMAX) 70 MG  tablet Take 70 mg by mouth once a week. On Thursday 08/22/13   Historical Provider, MD  Cholecalciferol (VITAMIN D) 2000 UNITS tablet Take 2,000 Units by mouth daily.    Historical Provider, MD  docusate sodium (COLACE) 100 MG capsule Take 1 capsule (100 mg total) by mouth every 12 (twelve) hours. 11/13/13   Ephraim Hamburger, MD  oxyCODONE-acetaminophen (PERCOCET) 5-325 MG per tablet Take 1 tablet by mouth every 4 (four) hours as needed for severe pain. 11/13/13   Ephraim Hamburger, MD  predniSONE (DELTASONE) 20 MG tablet Take 2 tablets (40 mg total) by mouth daily with breakfast. 11/15/13   Eliezer Bottom, NP    No family history on file.  History   Social History  . Marital Status: Single    Spouse Name: N/A    Number of Children: N/A  . Years of Education: N/A   Social History Main Topics  . Smoking status: Never Smoker   . Smokeless tobacco: Not on file  . Alcohol Use: Not on file  . Drug Use: Not on file  . Sexual Activity: Not on file   Other Topics Concern  . Not on file   Social History Narrative    ECOG Status: 0 - Asymptomatic  Review of Systems: A 12 point ROS discussed and pertinent positives are indicated in the HPI above.  All other systems are negative.  Review of Systems  Vital Signs: BP 156/74 mmHg  Pulse 97  Temp(Src) 98.3 F (36.8 C) (Oral)  Resp 13  SpO2 98%  Physical Exam  Constitutional: She is oriented  to person, place, and time. She appears well-developed and well-nourished.  HENT:  Head: Normocephalic and atraumatic.  Eyes: No scleral icterus.  Cardiovascular: Normal rate and regular rhythm.   Pulmonary/Chest: Effort normal.  Abdominal: Soft.  Neurological: She is alert and oriented to person, place, and time.  Skin: Skin is warm and dry.  Psychiatric: She has a normal mood and affect. Her behavior is normal.  Nursing note and vitals reviewed.   Imaging: US Biopsy  12/05/2013   INDICATION: History of right-sided renal carcinoma,  now with indeterminate lesion within the central aspect of the anterior segment of the right lobe of the liver adjacent to the gallbladder fossa. Please perform ultrasound-guided liver lesion biopsy for tissue diagnostic purposes.  EXAM: ULTRASOUND GUIDED LIVER LESION BIOPSY  COMPARISON:  CT abdomen pelvis - 11/13/2013; abdominal MRI -11/27/2013  MEDICATIONS: Fentanyl 50 mcg IV; Versed 1 mg IV  ANESTHESIA/SEDATION: Total Moderate Sedation time  20 minutes  COMPLICATIONS: None immediate  PROCEDURE: Informed written consent was obtained from the patient after a discussion of the risks, benefits and alternatives to treatment. The patient understands and consents the procedure. A timeout was performed prior to the initiation of the procedure.  Ultrasound scanning was performed of the right upper abdominal quadrant demonstrates a ill-defined mixed echogenic partially shadowing structure within the central aspect of the anterior segment of the right lobe of the liver compatible with the lesion seen on preceding abdominal CT and MRI.  The procedure was planned. The right upper abdominal quadrant was prepped and draped in the usual sterile fashion. The overlying soft tissues were anesthetized with 1% lidocaine with epinephrine. A 17 gauge, 6.8 cm co-axial needle was advanced into a peripheral aspect of the lesion. This was followed by 4 core biopsies with an 18 gauge core device under direct ultrasound guidance. Note, visualization was obscured secondary to poor sonographic window as well as the shadowing nature of the ill-defined hepatic mass.  The coaxial needle tract was embolized with a small amount of Gel foam. The co-axial needle was removed and hemostasis was obtained with manual compression. Post procedural scanning was negative for definitive area of hemorrhage or additional complication. A dressing was placed. The patient tolerated the procedure well without immediate post procedural complication.  IMPRESSION:  Technically successful ultrasound guided core needle biopsy of indeterminate mixed echogenic ill-defined partially shadowing mass within the central aspect of the anterior segment of the right lobe of the liver.   Electronically Signed   By: Sandi Mariscal M.D.   On: 12/05/2013 12:48   Mm Screening Breast Tomo Bilateral  12/11/2013   CLINICAL DATA:  Screening.  EXAM: DIGITAL SCREENING BILATERAL MAMMOGRAM WITH 3D TOMO WITH CAD  COMPARISON:  Previous exam(s).  ACR Breast Density Category b: There are scattered areas of fibroglandular density.  FINDINGS: There are no findings suspicious for malignancy. Images were processed with CAD.  IMPRESSION: No mammographic evidence of malignancy. A result letter of this screening mammogram will be mailed directly to the patient.  RECOMMENDATION: Screening mammogram in one year. (Code:[id number]-01Y)  BI-RADS CATEGORY  1: Negative.   Electronically Signed   By: Lovey Newcomer M.D.   On: 12/11/2013 09:05    Labs:  CBC:  Recent Labs  11/13/13 0337 12/05/13 0904  WBC 12.8* 8.9  HGB 14.7 14.2  HCT 44.3 43.8  PLT 236 262    COAGS:  Recent Labs  12/05/13 0904  INR 1.02  APTT 24    BMP:  Recent Labs  11/13/13  0337  NA 144  K 4.1  CL 102  CO2 26  GLUCOSE 162*  BUN 15  CALCIUM 9.5  CREATININE 0.58  GFRNONAA >90  GFRAA >90    LIVER FUNCTION TESTS:  Recent Labs  11/13/13 0337  BILITOT 0.3  AST 21  ALT 19  ALKPHOS 52  PROT 7.4  ALBUMIN 4.4    TUMOR MARKERS: No results for input(s): AFPTM, CEA, CA199, CHROMGRNA in the last 8760 hours.  Assessment and Plan:  Marie Thomas is a very pleasant 68 year old female with a lifelong paraplegia secondary to polio and an incidentally discovered 2.5 cm right enhancing renal neoplasm which almost certainly represents a renal cell carcinoma.  Due to her paraplegia and difficulty with lying flat, she is a suboptimal surgical candidate.  She understands that while surgical resection is the gold standard and  offers the best possible chance of cure. However, any significant recovery period would be particularly onerous for her.  We discussed the risks, benefits and alternatives to percutaneous ablation. Her lesion is in an excellent position posteriorly and is well within the size threshold for a successful ablation procedure. I think she would probably do well with a cryoablation. I told her to expect a single night stay in the hospital and likely discharge on postoperative day #1 unless we run into unexpected hematuria or other complication.  Once she goes home, her recovery will be quite minimal. She can resume her normal activities very quickly.  At this time, she would like to proceed with percutaneous cryoablation of her right renal lesion. We will also plan to biopsy the lesion at the time of treatment.   Thank you for this interesting consult.  I greatly enjoyed meeting Marie Thomas and look forward to participating in their care.   Signed,  Criselda Peaches, MD    I spent a total of 30 minutes face to face in clinical consultation, greater than 50% of which was counseling/coordinating care for right renal neoplasm.  SignedJacqulynn Cadet 01/02/2014, 12:46 PM

## 2014-01-13 ENCOUNTER — Other Ambulatory Visit: Payer: Self-pay | Admitting: Interventional Radiology

## 2014-01-13 DIAGNOSIS — N2889 Other specified disorders of kidney and ureter: Secondary | ICD-10-CM

## 2014-01-28 NOTE — Progress Notes (Signed)
Please put orders in Epic surgery 02-07-14 pre op 02-04-14 Thanks

## 2014-01-29 ENCOUNTER — Other Ambulatory Visit: Payer: Self-pay | Admitting: Radiology

## 2014-02-04 ENCOUNTER — Encounter (HOSPITAL_COMMUNITY)
Admission: RE | Admit: 2014-02-04 | Discharge: 2014-02-04 | Disposition: A | Discharge status: Home / Self Care | Payer: PPO | Source: Ambulatory Visit | Attending: Interventional Radiology | Admitting: Interventional Radiology

## 2014-02-04 ENCOUNTER — Encounter (HOSPITAL_COMMUNITY): Payer: Self-pay

## 2014-02-04 DIAGNOSIS — Z79899 Other long term (current) drug therapy: Secondary | ICD-10-CM | POA: Diagnosis not present

## 2014-02-04 DIAGNOSIS — Z8612 Personal history of poliomyelitis: Secondary | ICD-10-CM | POA: Diagnosis not present

## 2014-02-04 DIAGNOSIS — D1771 Benign lipomatous neoplasm of kidney: Secondary | ICD-10-CM | POA: Diagnosis not present

## 2014-02-04 DIAGNOSIS — G8220 Paraplegia, unspecified: Secondary | ICD-10-CM | POA: Diagnosis not present

## 2014-02-04 DIAGNOSIS — N2889 Other specified disorders of kidney and ureter: Secondary | ICD-10-CM | POA: Diagnosis present

## 2014-02-04 HISTORY — DX: Headache: R51

## 2014-02-04 HISTORY — DX: Myoneural disorder, unspecified: G70.9

## 2014-02-04 HISTORY — DX: Pain, unspecified: R52

## 2014-02-04 HISTORY — DX: Personal history of systemic steroid therapy: Z92.241

## 2014-02-04 HISTORY — DX: Headache, unspecified: R51.9

## 2014-02-04 LAB — CBC WITH DIFFERENTIAL/PLATELET
Basophils Absolute: 0 10*3/uL (ref 0.0–0.1)
Basophils Relative: 0 % (ref 0–1)
EOS PCT: 2 % (ref 0–5)
Eosinophils Absolute: 0.2 10*3/uL (ref 0.0–0.7)
HCT: 40.7 % (ref 36.0–46.0)
Hemoglobin: 13 g/dL (ref 12.0–15.0)
LYMPHS ABS: 2.1 10*3/uL (ref 0.7–4.0)
Lymphocytes Relative: 23 % (ref 12–46)
MCH: 29.5 pg (ref 26.0–34.0)
MCHC: 31.9 g/dL (ref 30.0–36.0)
MCV: 92.3 fL (ref 78.0–100.0)
Monocytes Absolute: 0.6 10*3/uL (ref 0.1–1.0)
Monocytes Relative: 7 % (ref 3–12)
Neutro Abs: 6.2 10*3/uL (ref 1.7–7.7)
Neutrophils Relative %: 68 % (ref 43–77)
PLATELETS: 261 10*3/uL (ref 150–400)
RBC: 4.41 MIL/uL (ref 3.87–5.11)
RDW: 15.6 % — ABNORMAL HIGH (ref 11.5–15.5)
WBC: 9.1 10*3/uL (ref 4.0–10.5)

## 2014-02-04 LAB — PROTIME-INR
INR: 0.94 (ref 0.00–1.49)
PROTHROMBIN TIME: 12.7 s (ref 11.6–15.2)

## 2014-02-04 LAB — BASIC METABOLIC PANEL
ANION GAP: 7 (ref 5–15)
BUN: 23 mg/dL (ref 6–23)
CALCIUM: 8.7 mg/dL (ref 8.4–10.5)
CO2: 28 mmol/L (ref 19–32)
CREATININE: 0.46 mg/dL — AB (ref 0.50–1.10)
Chloride: 108 mEq/L (ref 96–112)
GFR calc Af Amer: >90 mL/min (ref >90)
Glucose, Bld: 88 mg/dL (ref 70–99)
POTASSIUM: 3.1 mmol/L — AB (ref 3.5–5.1)
SODIUM: 143 mmol/L (ref 135–145)

## 2014-02-04 LAB — APTT: aPTT: 28 seconds (ref 24–37)

## 2014-02-04 NOTE — Pre-Procedure Instructions (Deleted)
[place]  02/04/2014   Your procedure is scheduled on:  1-22 -2016 Friday  Enter through [place] and follow signs to check in to [facility], then proceed to [facility]. Arrive at      0600  AM .  Call this number if you have problems the morning of surgery: [phone]  Or Presurgical Testing [phone].   For Living Will and/or Health Care Power Attorney Forms: please provide copy for your medical record,may bring AM of surgery(Forms should be already notarized -we do not provide this service).(02-04-14 Yes,has Living Will and HCPOA forms; will prefer not to bring).      Do not eat food/ or drink: After Midnight.  Exception: may have clear liquids:up to 6 Hours before arrival. Nothing after:  Clear liquids include soda, tea, black coffee, apple or grape juice, broth.  Take these medicines the morning of surgery with A SIP OF WATER:    Do not wear jewelry, make-up or nail polish.  Do not wear deodorant, lotions, powders, or perfumes.   Do not shave legs and under arms- 48 hours(2 days) prior to first CHG shower.(Shaving face and neck okay.)  Do not bring valuables to the hospital.(Hospital is not responsible for lost valuables).  Contacts, dentures or removable bridgework, body piercing, hair pins may not be worn into surgery.  Leave suitcase in the car. After surgery it may be brought to your room.  For patients admitted to the hospital, checkout time is 11:00 AM the day of discharge.(Restricted visitors-Any Persons displaying flu-like symptoms or illness).    Patients discharged the day of surgery will not be allowed to drive home. Must have responsible person with you x 24 hours once discharged.  Name and phone number of your driver: Jamonica Schoff [phone] cell     Please read over the following fact sheets that you were given:  CHG(Chlorhexidine Gluconate 4% Surgical Soap) use.  Remember : Type/Screen "Blue armbands" - may not be removed  once applied(would result in being retested AM of surgery, if removed).

## 2014-02-04 NOTE — Patient Instructions (Addendum)
[place]  02/04/2014   Your procedure is scheduled on:   02-07-2014 Friday   Enter through [facility]  Entrance and follow signs to check in Radiology, then to [facility]. Arrive at 0600       AM.  Call this number if you have problems the morning of surgery: [phone]  Or Presurgical Testing [phone].   For Living Will and/or Health Care Power Attorney Forms: please provide copy for your medical record,may bring AM of surgery(Forms should be already notarized -we do not provide this service).(02-04-14 Yes, Living Will and HCPOA forms- prefer nto to bring.) Remember: Follow any bowel prep instructions per MD office.   Do not eat food/ or drink: After Midnight.      Take these medicines the morning of surgery with A SIP OF WATER: none. Tylenol.   Do not wear jewelry, make-up or nail polish.  Do not wear deodorant, lotions, powders, or perfumes.   Do not shave legs and under arms- 48 hours(2 days) prior to first CHG shower.(Shaving face and neck okay.)  Do not bring valuables to the hospital.(Hospital is not responsible for lost valuables).  Contacts, dentures or removable bridgework, body piercing, hair pins may not be worn into surgery.  Leave suitcase in the car. After surgery it may be brought to your room.  For patients admitted to the hospital, checkout time is 11:00 AM the day of discharge.(Restricted visitors-Any Persons displaying flu-like symptoms or illness).    Patients discharged the day of surgery will not be allowed to drive home. Must have responsible person with you x 24 hours once discharged.  Name and phone number of your driver: Ambria Mayfield, sister -in law [phone] cell     Please read over the following fact sheets that you were given:  CHG(Chlorhexidine Gluconate 4% Surgical Soap) use.  Remember : Type/Screen "Blue armbands" - may not be removed once applied(would result in being retested AM of surgery, if removed).      [place] - Preparing for Surgery Before surgery, you can play an important role.  Because skin is not sterile, your skin needs to be as free of germs as possible.  You can reduce the number of germs on your skin by washing with CHG (chlorahexidine gluconate) soap before surgery.  CHG is an antiseptic cleaner which kills germs and bonds with the skin to continue killing germs even after washing. Please DO NOT use if you have an allergy to CHG or antibacterial soaps.  If your skin becomes reddened/irritated stop using the CHG and inform your nurse when you arrive at Short Stay. Do not shave (including legs and underarms) for at least 48 hours prior to the first CHG shower.  You may shave your face/neck. Please follow these instructions carefully:  1.  Shower with CHG Soap the night before surgery and the  morning of Surgery.  2.  If you choose to wash your hair, wash your hair first as usual with your  normal  shampoo.  3.  After you shampoo, rinse your hair and body thoroughly to remove the  shampoo.                           4.  Use CHG as you would any other liquid soap.  You can apply chg directly  to the skin and wash  Gently with a scrungie or clean washcloth.  5.  Apply the CHG Soap to your body ONLY FROM THE NECK DOWN.   Do not use on face/ open                           Wound or open sores. Avoid contact with eyes, ears mouth and genitals (private parts).                       Wash face,  Genitals (private parts) with your normal soap.             6.  Wash thoroughly, paying special attention to the area where your surgery  will be performed.  7.  Thoroughly rinse your body with warm water from the neck down.  8.  DO NOT shower/wash with your normal soap after using and rinsing off  the CHG Soap.                9.  Pat yourself dry with a clean towel.            10.  Wear clean pajamas.            11.  Place clean sheets on your bed the night of your first shower and  do not  sleep with pets. Day of Surgery : Do not apply any lotions/deodorants the morning of surgery.  Please wear clean clothes to the hospital/surgery center.  FAILURE TO FOLLOW THESE INSTRUCTIONS MAY RESULT IN THE CANCELLATION OF YOUR SURGERY PATIENT SIGNATURE_________________________________  NURSE SIGNATURE__________________________________  ________________________________________________________________________

## 2014-02-05 ENCOUNTER — Other Ambulatory Visit: Payer: Self-pay | Admitting: Radiology

## 2014-02-05 DIAGNOSIS — D1771 Benign lipomatous neoplasm of kidney: Secondary | ICD-10-CM | POA: Diagnosis not present

## 2014-02-05 LAB — ABO/RH: ABO/RH(D): A POS

## 2014-02-05 NOTE — Pre-Procedure Instructions (Signed)
02-05-14 4196" Kyleen",PA -IVR informed -labs viewable in Epic- Potassium 3.1. States"will repeat in AM of".

## 2014-02-06 ENCOUNTER — Other Ambulatory Visit: Payer: Self-pay | Admitting: Radiology

## 2014-02-07 ENCOUNTER — Encounter (HOSPITAL_COMMUNITY): Payer: Self-pay

## 2014-02-07 ENCOUNTER — Ambulatory Visit (HOSPITAL_COMMUNITY): Payer: PPO | Admitting: Anesthesiology

## 2014-02-07 ENCOUNTER — Encounter (HOSPITAL_COMMUNITY): Payer: Self-pay | Admitting: Anesthesiology

## 2014-02-07 ENCOUNTER — Observation Stay (HOSPITAL_COMMUNITY)
Admission: RE | Admit: 2014-02-07 | Discharge: 2014-02-08 | Disposition: A | Discharge status: Home / Self Care | Payer: PPO | Source: Ambulatory Visit | Attending: Interventional Radiology | Admitting: Interventional Radiology

## 2014-02-07 ENCOUNTER — Encounter (HOSPITAL_COMMUNITY)
Admission: RE | Disposition: A | Discharge status: Home / Self Care | Payer: Self-pay | Source: Ambulatory Visit | Attending: Interventional Radiology

## 2014-02-07 ENCOUNTER — Ambulatory Visit (HOSPITAL_COMMUNITY)
Admission: RE | Admit: 2014-02-07 | Discharge: 2014-02-07 | Disposition: A | Discharge status: Home / Self Care | Payer: PPO | Source: Ambulatory Visit | Attending: Interventional Radiology | Admitting: Interventional Radiology

## 2014-02-07 DIAGNOSIS — Z8612 Personal history of poliomyelitis: Secondary | ICD-10-CM | POA: Insufficient documentation

## 2014-02-07 DIAGNOSIS — Z79899 Other long term (current) drug therapy: Secondary | ICD-10-CM | POA: Insufficient documentation

## 2014-02-07 DIAGNOSIS — G8220 Paraplegia, unspecified: Secondary | ICD-10-CM | POA: Insufficient documentation

## 2014-02-07 DIAGNOSIS — D1771 Benign lipomatous neoplasm of kidney: Principal | ICD-10-CM | POA: Insufficient documentation

## 2014-02-07 DIAGNOSIS — N2889 Other specified disorders of kidney and ureter: Secondary | ICD-10-CM | POA: Insufficient documentation

## 2014-02-07 DIAGNOSIS — D3000 Benign neoplasm of unspecified kidney: Secondary | ICD-10-CM | POA: Diagnosis present

## 2014-02-07 LAB — TYPE AND SCREEN
ABO/RH(D): A POS
Antibody Screen: NEGATIVE

## 2014-02-07 LAB — POTASSIUM: Potassium: 3.7 mmol/L (ref 3.5–5.1)

## 2014-02-07 SURGERY — RADIO FREQUENCY ABLATION
Anesthesia: General

## 2014-02-07 MED ORDER — PHENYLEPHRINE HCL 10 MG/ML IJ SOLN
INTRAMUSCULAR | Status: DC | PRN
  Administered 2014-02-07 (×2): 80 ug via INTRAVENOUS

## 2014-02-07 MED ORDER — LIDOCAINE HCL (CARDIAC) 20 MG/ML IV SOLN
INTRAVENOUS | Status: DC | PRN
Start: 2014-02-07 — End: 2014-02-07
  Administered 2014-02-07: 100 mg via INTRAVENOUS

## 2014-02-07 MED ORDER — SODIUM CHLORIDE 0.9 % IV SOLN: INTRAVENOUS | Status: AC

## 2014-02-07 MED ORDER — DEXAMETHASONE SODIUM PHOSPHATE 10 MG/ML IJ SOLN
INTRAMUSCULAR | Status: DC | PRN
  Administered 2014-02-07: 10 mg via INTRAVENOUS

## 2014-02-07 MED ORDER — SENNOSIDES-DOCUSATE SODIUM 8.6-50 MG PO TABS: 1.0000 | ORAL_TABLET | Freq: Every day | ORAL | Status: DC | PRN

## 2014-02-07 MED ORDER — HYDROMORPHONE HCL 1 MG/ML IJ SOLN
INTRAMUSCULAR | Status: AC
  Filled 2014-02-07: qty 1

## 2014-02-07 MED ORDER — PREDNISONE 20 MG PO TABS
40.0000 mg | ORAL_TABLET | Freq: Every day | ORAL | Status: DC
  Filled 2014-02-07 (×2): qty 2

## 2014-02-07 MED ORDER — PROPOFOL 10 MG/ML IV BOLUS
INTRAVENOUS | Status: DC | PRN
  Administered 2014-02-07: 200 mg via INTRAVENOUS
  Administered 2014-02-07: 50 mg via INTRAVENOUS

## 2014-02-07 MED ORDER — ROCURONIUM BROMIDE 100 MG/10ML IV SOLN
INTRAVENOUS | Status: AC
  Filled 2014-02-07: qty 1

## 2014-02-07 MED ORDER — IOHEXOL 300 MG/ML  SOLN
100.0000 mL | Freq: Once | INTRAMUSCULAR | Status: AC | PRN
  Administered 2014-02-07: 100 mL via INTRAVENOUS

## 2014-02-07 MED ORDER — ONDANSETRON HCL 4 MG/2ML IJ SOLN
INTRAMUSCULAR | Status: DC | PRN
  Administered 2014-02-07: 4 mg via INTRAVENOUS

## 2014-02-07 MED ORDER — DOCUSATE SODIUM 100 MG PO CAPS
100.0000 mg | ORAL_CAPSULE | Freq: Two times a day (BID) | ORAL | Status: DC
  Administered 2014-02-07: 100 mg via ORAL
  Filled 2014-02-07 (×3): qty 1

## 2014-02-07 MED ORDER — FENTANYL CITRATE 0.05 MG/ML IJ SOLN
INTRAMUSCULAR | Status: AC
  Filled 2014-02-07: qty 2

## 2014-02-07 MED ORDER — ONDANSETRON HCL 4 MG/2ML IJ SOLN: 4.0000 mg | Freq: Four times a day (QID) | INTRAMUSCULAR | Status: DC | PRN

## 2014-02-07 MED ORDER — ONDANSETRON HCL 4 MG/2ML IJ SOLN: 4.0000 mg | Freq: Once | INTRAMUSCULAR | Status: DC | PRN

## 2014-02-07 MED ORDER — LACTATED RINGERS IV SOLN
INTRAVENOUS | Status: DC
  Administered 2014-02-07 (×3): via INTRAVENOUS

## 2014-02-07 MED ORDER — HYDROMORPHONE HCL 1 MG/ML IJ SOLN
0.2500 mg | INTRAMUSCULAR | Status: DC | PRN
  Administered 2014-02-07 (×3): 0.5 mg via INTRAVENOUS

## 2014-02-07 MED ORDER — CEFAZOLIN SODIUM-DEXTROSE 2-3 GM-% IV SOLR
2.0000 g | INTRAVENOUS | Status: AC
  Administered 2014-02-07: 2 g via INTRAVENOUS
  Filled 2014-02-07 (×2): qty 50

## 2014-02-07 MED ORDER — HYDROCODONE-ACETAMINOPHEN 5-325 MG PO TABS
1.0000 | ORAL_TABLET | ORAL | Status: DC | PRN
  Administered 2014-02-07 – 2014-02-08 (×2): 2 via ORAL
  Filled 2014-02-07 (×2): qty 2

## 2014-02-07 MED ORDER — ACETAMINOPHEN 500 MG PO TABS
1000.0000 mg | ORAL_TABLET | Freq: Four times a day (QID) | ORAL | Status: DC | PRN
  Administered 2014-02-07: 1000 mg via ORAL
  Filled 2014-02-07: qty 2

## 2014-02-07 NOTE — Procedures (Signed)
Interventional Radiology Procedure Note  Procedure: CT guided cryoablation of right renal mass.  Standard 10 x 8 x10' ablation using 3x PCS24 probes.  Complications: None immediate Recommendations: - Bedrest x 6 hrs - No lovenox - Watch urine - ADAT - Anticipate DC In am barring any complications  Signed,  Criselda Peaches, MD

## 2014-02-07 NOTE — Anesthesia Postprocedure Evaluation (Signed)
  Anesthesia Post-op Note  Patient: Marie Thomas  Procedure(s) Performed: Procedure(s): RENAL CRYO ABLATION (N/A)  Patient Location: PACU  Anesthesia Type:General  Level of Consciousness: awake, alert , oriented and patient cooperative  Airway and Oxygen Therapy: Patient Spontanous Breathing  Post-op Pain: mild  Post-op Assessment: Post-op Vital signs reviewed, Patient's Cardiovascular Status Stable, Respiratory Function Stable, Patent Airway, No signs of Nausea or vomiting and Pain level controlled  Post-op Vital Signs: stable  Last Vitals:  Filed Vitals:   02/07/14 1200  BP: 145/74  Pulse: 84  Temp:   Resp: 12    Complications: No apparent anesthesia complications

## 2014-02-07 NOTE — H&P (Signed)
Chief Complaint: Right renal mass  Referring Physician(s): Dr. Marin Olp  History of Present Illness: Marie Thomas is a 69 y.o. female with PMHx of polio and paraplegia since infancy. She has been in good health until imaging workup for RLQ pain revealed 2.7 cm enhancing solid mass in upper pole of right kidney and hepatic lesion s/p hepatic lesion biopsy on 12/05/13 which was negative for malignancy. She has been seen by Dr. Laurence Ferrari in consult on 01/02/14 to discuss treatment options for the right renal mass since she is a suboptimal surgical candidate. She has been scheduled today for a right renal mass biopsy and cryoablation. She denies any chest pain, shortness of breath or palpitations. She denies any active signs of bleeding or excessive bruising. She denies any recent fever, chills or urinary symptoms. The patient denies any history of sleep apnea or chronic oxygen use. She has previously tolerated anesthesia.     Past Medical History  Diagnosis Date  . Polio   . 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 H/O steroid therapy     Steroid use -last used 1 week ago  . Pain at rest     abdominal pain, right side- intermittent"dx. right renal mass"  . Headache     migraines-have occurred less since retired '99.  . Neuromuscular disorder     polio- no mobility lower extremities bilaterally    Past Surgical History  Procedure Laterality Date  . Abdominal hysterectomy    . Leg tendon surgery      age 89 or 69 yrs old    Allergies: Review of patient's allergies indicates no known allergies.  Medications: Prior to Admission medications   Medication Sig Start Date End Date Taking? Authorizing Provider  alendronate (FOSAMAX) 70 MG tablet Take 70 mg by mouth once a week. On Thursday 08/22/13  Yes Historical Provider, MD  Cholecalciferol (VITAMIN D) 2000 UNITS tablet Take 2,000 Units by  mouth daily.   Yes Historical Provider, MD  docusate sodium (COLACE) 100 MG capsule Take 1 capsule (100 mg total) by mouth every 12 (twelve) hours. Patient not taking: Reported on 02/03/2014 11/13/13   Ephraim Hamburger, MD  oxyCODONE-acetaminophen (PERCOCET) 5-325 MG per tablet Take 1 tablet by mouth every 4 (four) hours as needed for severe pain. Patient not taking: Reported on 02/03/2014 11/13/13   Ephraim Hamburger, MD  predniSONE (DELTASONE) 20 MG tablet Take 2 tablets (40 mg total) by mouth daily with breakfast. Patient not taking: Reported on 02/03/2014 11/15/13   Eliezer Bottom, NP    History reviewed. No pertinent family history.  History   Social History  . Marital Status: Single    Spouse Name: N/A    Number of Children: N/A  . Years of Education: N/A   Social History Main Topics  . Smoking status: Never Smoker   . Smokeless tobacco: None  . Alcohol Use: Yes     Comment: social  . Drug Use: No  . Sexual Activity: Not Currently   Other Topics Concern  . None   Social History Narrative    Review of Systems: A 12 point ROS discussed and pertinent positives are indicated in the HPI above.  All other systems are negative.  Review of Systems  Vital Signs: BP 139/98 mmHg  Pulse 98  Temp(Src) 98.1 F (36.7 C) (Oral)  Resp 16  SpO2 97%  Physical Exam  Constitutional: She is oriented to person, place, and time. No distress.  HENT:  Head: Normocephalic and atraumatic.  Neck: No tracheal deviation present.  Cardiovascular: Normal rate and regular rhythm.  Exam reveals no gallop and no friction rub.   No murmur heard. Pulmonary/Chest: Effort normal and breath sounds normal. No respiratory distress. She has no wheezes. She has no rales.  Abdominal: Soft. Bowel sounds are normal. She exhibits no distension. There is no tenderness.  Neurological: She is alert and oriented to person, place, and time.  Skin: She is not diaphoretic.  Psychiatric: She has a normal mood  and affect. Her behavior is normal. Thought content normal.    Imaging: No results found.  Labs:  CBC:  Recent Labs  11/13/13 0337 12/05/13 0904 02/04/14 1525  WBC 12.8* 8.9 9.1  HGB 14.7 14.2 13.0  HCT 44.3 43.8 40.7  PLT 236 262 261    COAGS:  Recent Labs  12/05/13 0904 02/04/14 1525  INR 1.02 0.94  APTT 24 28    BMP:  Recent Labs  11/13/13 0337 02/04/14 1525 02/07/14 0655  NA 144 143  --   K 4.1 3.1* 3.7  CL 102 108  --   CO2 26 28  --   GLUCOSE 162* 88  --   BUN 15 23  --   CALCIUM 9.5 8.7  --   CREATININE 0.58 0.46*  --   GFRNONAA >90 >90  --   GFRAA >90 >90  --     LIVER FUNCTION TESTS:  Recent Labs  11/13/13 0337  BILITOT 0.3  AST 21  ALT 19  ALKPHOS 52  PROT 7.4  ALBUMIN 4.4   Assessment and Plan: Right renal mass probable RCC Seen in consult with Dr. Laurence Ferrari on 01/02/14 Scheduled today for CT guided right renal mass biopsy and cryoablation with general anesthesia Patient has been NPO, no blood thinners taken, labs reviewed Risks and Benefits discussed with the patient. All of the patient's questions were answered, patient is agreeable to proceed. Consent signed and in chart. She will be admitted overnight for observation and discharged in the morning if stable.       SignedHedy Jacob 02/07/2014, 8:38 AM

## 2014-02-07 NOTE — Progress Notes (Signed)
Interventional Radiology Post Op Note  HPI:  69 yo female with enhancing 3 cm right renal neoplasm.  She is now POD#0 s/p bx of teh mass and concurrent cryoablation.   Subjective: Complains of urethral burning and sensation of needing  to void.  Feels Foley catheter is very uncomfortable.  Denies fever, chills, abdominal or flank pain.    Objective: Filed Vitals:   02/07/14 1450  BP: 140/64  Pulse: 93  Temp: 97.8 F (36.6 C)  Resp: 18   Foley: Approximately 400 mL in bag, minimal pink-tinge.  No clots.  Right flank: Non tender, bandage C/D/I Abdomen: Soft, NT/ND  A/P:  Doing well POD#0 s/p right renal biopsy and cryoablation.  Having some discomfort related to Foley.   - Tylenol 1g Q 6 hrs PRN mild pain - D/C foley at 6 hrs post-op if urine remains clear - ADAT - Encourage liquids  Signed,  Criselda Peaches, MD

## 2014-02-07 NOTE — Transfer of Care (Signed)
Immediate Anesthesia Transfer of Care Note  Patient: Marie Thomas  Procedure(s) Performed: Procedure(s): RENAL CRYO ABLATION (N/A)  Patient Location: PACU  Anesthesia Type:General  Level of Consciousness: sedated  Airway & Oxygen Therapy: Patient Spontanous Breathing and Patient connected to face mask oxygen  Post-op Assessment: Report given to PACU RN and Post -op Vital signs reviewed and stable  Post vital signs: Reviewed and stable  Complications: No apparent anesthesia complications

## 2014-02-07 NOTE — Anesthesia Preprocedure Evaluation (Signed)
Anesthesia Evaluation  Patient identified by MRN, date of birth, ID band Patient awake    Reviewed: Allergy & Precautions, NPO status , Patient's Chart, lab work & pertinent test results  Airway        Dental   Pulmonary          Cardiovascular     Neuro/Psych  Headaches,  Neuromuscular disease    GI/Hepatic   Endo/Other    Renal/GU Renal disease     Musculoskeletal   Abdominal   Peds  Hematology   Anesthesia Other Findings Hx polio Paraplegia  Reproductive/Obstetrics                             Anesthesia Physical Anesthesia Plan  ASA: II  Anesthesia Plan: General   Post-op Pain Management:    Induction: Intravenous  Airway Management Planned: Oral ETT  Additional Equipment:   Intra-op Plan:   Post-operative Plan: Extubation in OR  Informed Consent: I have reviewed the patients History and Physical, chart, labs and discussed the procedure including the risks, benefits and alternatives for the proposed anesthesia with the patient or authorized representative who has indicated his/her understanding and acceptance.     Plan Discussed with: CRNA, Anesthesiologist and Surgeon  Anesthesia Plan Comments:         Anesthesia Quick Evaluation

## 2014-02-07 NOTE — Anesthesia Procedure Notes (Signed)
Procedure Name: Intubation Date/Time: 02/07/2014 9:13 AM Performed by: Lind Covert Pre-anesthesia Checklist: Patient identified, Emergency Drugs available, Suction available, Patient being monitored and Timeout performed Patient Re-evaluated:Patient Re-evaluated prior to inductionOxygen Delivery Method: Circle system utilized Preoxygenation: Pre-oxygenation with 100% oxygen Intubation Type: IV induction Ventilation: Mask ventilation without difficulty Laryngoscope Size: Mac and 3 Grade View: Grade II Tube type: Oral Tube size: 7.0 mm Number of attempts: 1 Airway Equipment and Method: Stylet Placement Confirmation: ETT inserted through vocal cords under direct vision,  positive ETCO2 and CO2 detector Secured at: 21 cm Tube secured with: Tape Dental Injury: Teeth and Oropharynx as per pre-operative assessment

## 2014-02-08 DIAGNOSIS — N2889 Other specified disorders of kidney and ureter: Secondary | ICD-10-CM | POA: Insufficient documentation

## 2014-02-08 DIAGNOSIS — D1771 Benign lipomatous neoplasm of kidney: Secondary | ICD-10-CM | POA: Diagnosis not present

## 2014-02-08 LAB — CBC
HEMATOCRIT: 35.3 % — AB (ref 36.0–46.0)
Hemoglobin: 11 g/dL — ABNORMAL LOW (ref 12.0–15.0)
MCH: 28.7 pg (ref 26.0–34.0)
MCHC: 31.2 g/dL (ref 30.0–36.0)
MCV: 92.2 fL (ref 78.0–100.0)
Platelets: 208 10*3/uL (ref 150–400)
RBC: 3.83 MIL/uL — AB (ref 3.87–5.11)
RDW: 15.8 % — ABNORMAL HIGH (ref 11.5–15.5)
WBC: 10 10*3/uL (ref 4.0–10.5)

## 2014-02-08 LAB — BASIC METABOLIC PANEL
Anion gap: 6 (ref 5–15)
BUN: 16 mg/dL (ref 6–23)
CALCIUM: 8.7 mg/dL (ref 8.4–10.5)
CHLORIDE: 103 mmol/L (ref 96–112)
CO2: 30 mmol/L (ref 19–32)
Creatinine, Ser: 0.42 mg/dL — ABNORMAL LOW (ref 0.50–1.10)
GFR calc non Af Amer: >90 mL/min (ref >90)
Glucose, Bld: 127 mg/dL — ABNORMAL HIGH (ref 70–99)
Potassium: 4.8 mmol/L (ref 3.5–5.1)
Sodium: 139 mmol/L (ref 135–145)

## 2014-02-08 MED ORDER — HYDROCODONE-ACETAMINOPHEN 5-325 MG PO TABS: 1.0000 | ORAL_TABLET | ORAL | Status: DC | PRN

## 2014-02-08 NOTE — Progress Notes (Signed)
Discharge instructions reviewed with patient and her sister-in-law using teach back and they demonstrated understanding.  Patient stable for discharge home.  Patient's assessment unchanged from this am.  [facility]  02/08/2014  9:27 AM

## 2014-02-08 NOTE — Discharge Instructions (Signed)
Cryoablation, Care After Refer to this sheet in the next few weeks. These instructions provide you with information on caring for yourself after your procedure. Your health care provider may also give you more specific instructions. Your treatment has been planned according to current medical practices, but problems sometimes occur. Call your health care provider if you have any problems or questions after your procedure.  WHAT TO EXPECT AFTER THE PROCEDURE After your procedure, it is typical to have the following:  Soreness around your puncture sites for 3-5 days. Some minor bruising may also be present. You will be given pain medicines to control the pain.  Mild abdominal, flank, or right shoulder pain for 24 hours. HOME CARE INSTRUCTIONS  Only take over-the-counter or prescription medicines as directed by your health care provider. Take all medicines exactly as directed.  Follow any prescribed diet.  Follow your health care provider's instructions regarding rest and physical activity. You should be able to go back to your normal level of activity within several days of the procedure. SEEK MEDICAL CARE IF:  You cannot pass gas.  You are not able to have a bowel movement within 2 days.  You have sickness in your stomach (nausea) or vomiting.  You have redness or drainage from any of your puncture sites. SEEK IMMEDIATE MEDICAL CARE IF:  You have severe or lasting abdominal pain or pain in your shoulder or back.   You have a fever.   You have a skin rash.   You have trouble swallowing or breathing.   You have severe weakness or dizziness.  You have chest pain or shortness of breath.

## 2014-02-08 NOTE — Progress Notes (Signed)
UR completed 

## 2014-02-08 NOTE — Discharge Summary (Signed)
Patient ID: Marie Thomas MRN: [id number] DOB/AGE: 1945/07/24 69 y.o.  Admit date: 02/07/2014 Discharge date: 02/08/2014  Admission Diagnoses: Right renal mass  Discharge Diagnoses:  Active Problems:   Renal benign neoplasm   Discharged Condition: good  Hospital Course:  HPI: Marie Thomas is a 69 y.o. female with PMHx of polio and paraplegia since infancy. She has been in good health until imaging workup for RLQ pain revealed 2.7 cm enhancing solid mass in upper pole of right kidney and hepatic lesion s/p hepatic lesion biopsy on 12/05/13 which was negative for malignancy. She has been seen by Dr. Laurence Ferrari in consult on 01/02/14 to discuss treatment options for the right renal mass since she is a suboptimal surgical candidate. She was been scheduled for a right renal mass biopsy and cryoablation. She was brought to CT suite and after successful general anesthesia, underwent CT guided cryoablation of right renal mass. No immediate complications. She was admitted to floor in stable condition. Foley was removed and she is voiding well, no hematuria.  She has eaten and denies N/V. She has minimal pain. No changes in labs, see below. She is stable for discharge. All instructions, restrictions, and follow up plans reviewed.  Consults: None  Significant Diagnostic Studies: labs:  CBC    Component Value Date/Time   WBC 10.0 02/08/2014 0433   RBC 3.83* 02/08/2014 0433   HGB 11.0* 02/08/2014 0433   HCT 35.3* 02/08/2014 0433   PLT 208 02/08/2014 0433   MCV 92.2 02/08/2014 0433   MCH 28.7 02/08/2014 0433   MCHC 31.2 02/08/2014 0433   RDW 15.8* 02/08/2014 0433   LYMPHSABS 2.1 02/04/2014 1525   MONOABS 0.6 02/04/2014 1525   EOSABS 0.2 02/04/2014 1525   BASOSABS 0.0 02/04/2014 1525    BMET    Component Value Date/Time   NA 139 02/08/2014 0433   K 4.8 02/08/2014 0433   CL 103 02/08/2014 0433   CO2 30 02/08/2014 0433   GLUCOSE 127* 02/08/2014 0433   BUN 16  02/08/2014 0433   CREATININE 0.42* 02/08/2014 0433   CALCIUM 8.7 02/08/2014 0433   GFRNONAA >90 02/08/2014 0433   GFRAA >90 02/08/2014 0433      Treatments: CT guided cryoablation of right renal mass 1/22  Discharge Exam: Blood pressure 151/62, pulse 93, temperature 97.4 F (36.3 C), temperature source Oral, resp. rate 18, height 4\' 10"  (1.473 m), weight 140 lb (63.504 kg), SpO2 100 %. Gen: NAD ENT: unremarkable Lungs: CTA without w/r/r Heart: Regular Abdomen: (R)post percutaneous access site clean, NT, no hematoma   Disposition: 01-Home or Self Care     Medication List    ASK your doctor about these medications        alendronate 70 MG tablet  Commonly known as:  FOSAMAX  Take 70 mg by mouth once a week. On Thursday     docusate sodium 100 MG capsule  Commonly known as:  COLACE  Take 1 capsule (100 mg total) by mouth every 12 (twelve) hours.     oxyCODONE-acetaminophen 5-325 MG per tablet  Commonly known as:  PERCOCET  Take 1 tablet by mouth every 4 (four) hours as needed for severe pain.     predniSONE 20 MG tablet  Commonly known as:  DELTASONE  Take 2 tablets (40 mg total) by mouth daily with breakfast.     Vitamin D 2000 UNITS tablet  Take 2,000 Units by mouth daily.       Follow-up Information  Follow up with [facility], HEATH, MD. Schedule an appointment as soon as possible for a visit in 3 weeks.   Specialty:  Interventional Radiology   Why:  Office will call with appointment times. May also call [phone] or [phone] with questions or concerns.   Contact information:   [address]  [place] [address] [phone]       I have spent less than 30 minutes coordinating discharge for [facility].    Signed: Ascencion Dike PA-C Interventional Radiology 02/08/2014 8:08 AM

## 2014-02-10 ENCOUNTER — Encounter: Payer: Self-pay | Admitting: *Deleted

## 2014-02-13 ENCOUNTER — Other Ambulatory Visit (HOSPITAL_COMMUNITY): Payer: Self-pay | Admitting: Interventional Radiology

## 2014-02-13 ENCOUNTER — Other Ambulatory Visit: Payer: Self-pay | Admitting: Emergency Medicine

## 2014-02-13 DIAGNOSIS — N2889 Other specified disorders of kidney and ureter: Secondary | ICD-10-CM

## 2014-02-13 DIAGNOSIS — D3001 Benign neoplasm of right kidney: Secondary | ICD-10-CM

## 2014-02-20 ENCOUNTER — Ambulatory Visit
Admission: RE | Admit: 2014-02-20 | Discharge: 2014-02-20 | Disposition: A | Discharge status: Home / Self Care | Payer: PPO | Source: Ambulatory Visit | Attending: Interventional Radiology | Admitting: Interventional Radiology

## 2014-02-20 ENCOUNTER — Telehealth: Payer: Self-pay | Admitting: *Deleted

## 2014-02-20 ENCOUNTER — Other Ambulatory Visit: Payer: Self-pay | Admitting: Radiology

## 2014-02-20 ENCOUNTER — Other Ambulatory Visit: Payer: Self-pay | Admitting: Emergency Medicine

## 2014-02-20 ENCOUNTER — Other Ambulatory Visit (HOSPITAL_COMMUNITY): Payer: Self-pay | Admitting: Interventional Radiology

## 2014-02-20 ENCOUNTER — Telehealth: Payer: Self-pay | Admitting: Emergency Medicine

## 2014-02-20 VITALS — BP 174/89 | HR 123 | Temp 98.8°F | Resp 14

## 2014-02-20 DIAGNOSIS — R6883 Chills (without fever): Secondary | ICD-10-CM

## 2014-02-20 DIAGNOSIS — N2889 Other specified disorders of kidney and ureter: Secondary | ICD-10-CM

## 2014-02-20 DIAGNOSIS — R109 Unspecified abdominal pain: Secondary | ICD-10-CM

## 2014-02-20 DIAGNOSIS — D3001 Benign neoplasm of right kidney: Secondary | ICD-10-CM

## 2014-02-20 HISTORY — PX: IR GENERIC HISTORICAL: IMG1180011

## 2014-02-20 LAB — BASIC METABOLIC PANEL
BUN: 10 mg/dL (ref 6–23)
CO2: 28 meq/L (ref 19–32)
Calcium: 9.7 mg/dL (ref 8.4–10.5)
Chloride: 100 mEq/L (ref 96–112)
Creat: 0.5 mg/dL (ref 0.50–1.10)
Glucose, Bld: 107 mg/dL — ABNORMAL HIGH (ref 70–99)
Potassium: 3.7 mEq/L (ref 3.5–5.3)
SODIUM: 141 meq/L (ref 135–145)

## 2014-02-20 LAB — CBC WITH DIFFERENTIAL/PLATELET
Basophils Absolute: 0 10*3/uL (ref 0.0–0.1)
Eosinophils Absolute: 0 10*3/uL (ref 0.0–0.7)
HEMATOCRIT: 35.3 % — AB (ref 36.0–46.0)
Hemoglobin: 11.7 g/dL — ABNORMAL LOW (ref 12.0–15.0)
LYMPHS PCT: 10 % — AB (ref 12–46)
Lymphs Abs: 1.3 10*3/uL (ref 0.7–4.0)
MCH: 29 pg (ref 26.0–34.0)
MCHC: 33.1 g/dL (ref 30.0–36.0)
MCV: 87.6 fL (ref 78.0–100.0)
MONO ABS: 0.5 10*3/uL (ref 0.1–1.0)
Monocytes Relative: 4 % (ref 3–12)
Neutro Abs: 11.5 10*3/uL — ABNORMAL HIGH (ref 1.7–7.7)
Neutrophils Relative %: 86 % — ABNORMAL HIGH (ref 43–77)
Platelets: 552 10*3/uL — ABNORMAL HIGH (ref 150–400)
RBC: 4.03 MIL/uL (ref 3.87–5.11)
RDW: 15.6 % — ABNORMAL HIGH (ref 11.5–15.5)
WBC: 13.4 10*3/uL — AB (ref 4.0–10.5)

## 2014-02-20 MED ORDER — IOHEXOL 300 MG/ML  SOLN
100.0000 mL | Freq: Once | INTRAMUSCULAR | Status: AC | PRN
  Administered 2014-02-20: 100 mL via INTRAVENOUS

## 2014-02-20 NOTE — Telephone Encounter (Signed)
Patient has difficulty getting to appointments. She is questioning the need to come see Dr Marin Olp on 2/15. She states she knows the biopsy was benign and shes not sure why Dr Marin Olp needs to see her. I spoke with Dr Marin Olp and he says it's okay to cancel per patient request.

## 2014-02-20 NOTE — Telephone Encounter (Signed)
Pt called w/ c/c of continued pain (sharp & stabbing) from her back and radiates around to her abd area, just started this week.  She has also been experiencing sweating and chills with the increased pain.  She has not taken her temp.  Not sure if she has a thermometer at home.  She states that she is only taking her pain meds at bedtime so she can sleep and taking tylenol 500mg  during the day so she is not sooo sleepy.  S/w Dr Laurence Ferrari and we are going to see pt today with a STAT BMP next door.  Called pt back and told her to be at the lab by 1230pm and to see [place] at 230pm today.

## 2014-02-20 NOTE — Progress Notes (Signed)
Chief Complaint: Acute right flank pain 2 weeks post cryoablation of right renal neoplasm  Referring Physician(s): Melik Blancett  History of Present Illness: Marie Thomas is a 69 y.o. female who had an enhancing mass in her right kidney. She is a poor operative candidate secondary to polio and chronic paraplegia. She underwent percutaneous cryoablation and concurrent biopsy of the mass on 02/07/2014. Her initial postoperative course was uncomplicated and she was discharged the following day. 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  Her first week home postoperatively she did very well with no complaint. On Saturday, she took a bath for the first time in several weeks. This required self transfer from her wheelchair into the bathtub which she had not done in some time. When she woke up on Sunday morning she noted severe 10/10 right flank pain radiating anteriorly around toward the abdomen. Additionally, she had pain with deep inspiration in the right upper quadrant.  She took some of her pain medicine which eased the pain. On Monday, her pain felt better and has felt slightly better progressively each day. Given the persistence of the pain she decided to come in for evaluation today. She denies hematuria. She denies definitive fever although she does admit to some subjective chills each day going back to Sunday.  She denies chest pain or shortness of breath.  Past Medical History  Diagnosis Date  . Polio   . 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 H/O steroid therapy     Steroid use -last used 1 week ago  . Pain at rest     abdominal pain, right side- intermittent"dx. right renal mass"  . Headache     migraines-have occurred less since retired '99.  . Neuromuscular disorder     polio- no mobility lower extremities bilaterally    Past Surgical History    Procedure Laterality Date  . Abdominal hysterectomy    . Leg tendon surgery      age 71 or 69 yrs old    Allergies: Review of patient's allergies indicates no known allergies.  Medications: Prior to Admission medications   Medication Sig Start Date End Date Taking? Authorizing Provider  alendronate (FOSAMAX) 70 MG tablet Take 70 mg by mouth once a week. On Thursday 08/22/13   Historical Provider, MD  Cholecalciferol (VITAMIN D) 2000 UNITS tablet Take 2,000 Units by mouth daily.    Historical Provider, MD  docusate sodium (COLACE) 100 MG capsule Take 1 capsule (100 mg total) by mouth every 12 (twelve) hours. Patient not taking: Reported on 02/03/2014 11/13/13   Ephraim Hamburger, MD  HYDROcodone-acetaminophen (NORCO/VICODIN) 5-325 MG per tablet Take 1-2 tablets by mouth every 4 (four) hours as needed for moderate pain. 02/08/14   Ascencion Dike, PA-C  oxyCODONE-acetaminophen (PERCOCET) 5-325 MG per tablet Take 1 tablet by mouth every 4 (four) hours as needed for severe pain. Patient not taking: Reported on 02/03/2014 11/13/13   Ephraim Hamburger, MD  predniSONE (DELTASONE) 20 MG tablet Take 2 tablets (40 mg total) by mouth daily with breakfast. Patient not taking: Reported on 02/03/2014 11/15/13   Eliezer Bottom, NP    No family history on file.  History   Social History  . Marital Status: Single    Spouse Name: N/A    Number of Children: N/A  . Years of Education: N/A   Social History Main Topics  .  Smoking status: Never Smoker   . Smokeless tobacco: Not on file  . Alcohol Use: Yes     Comment: social  . Drug Use: No  . Sexual Activity: Not Currently   Other Topics Concern  . Not on file   Social History Narrative   Review of Systems: A 12 point ROS discussed and pertinent positives are indicated in the HPI above.  All other systems are negative.  Review of Systems  Vital Signs: BP 174/89 mmHg  Pulse 123  Temp(Src) 98.8 F (37.1 C) (Oral)  Resp 14  SpO2  96%  Physical Exam  Constitutional: She is oriented to person, place, and time. She appears well-developed and well-nourished.  HENT:  Head: Normocephalic and atraumatic.  Eyes: No scleral icterus.  Cardiovascular: Regular rhythm.  Tachycardia present.   Pulmonary/Chest: Effort normal and breath sounds normal. No respiratory distress.  Abdominal: Soft. She exhibits no distension. There is no tenderness.  Musculoskeletal:       Thoracic back: She exhibits no tenderness.  Neurological: She is alert and oriented to person, place, and time.  Skin: Skin is warm, dry and intact.  Psychiatric: She has a normal mood and affect. Her behavior is normal.    Imaging: Ct Guide Tissue Ablation  02/07/2014   CLINICAL DATA:  69 year old female with a 3 cm enhancing mass the posterior aspect of the right kidney concerning for renal neoplasm, likely renal cell carcinoma. She presents today for percutaneous CT-guided biopsy to be followed immediately by cryoablation.  EXAM: CT GUIDED ABLATION; CT BIOPSY  Date: 02/07/2014  PROCEDURE: 1. CT-guided biopsy of right renal mass 2. CT-guided cryoablation of right renal mass Interventional Radiologist:  Criselda Peaches, MD  ANESTHESIA/SEDATION: General anesthesia provided by the anesthesiology service.  MEDICATIONS: 2 g Ancef administered intravenously within 1 hr of skin incision  FLUOROSCOPY TIME:  None  CONTRAST:  155mL OMNIPAQUE IOHEXOL 300 MG/ML  SOLN  TECHNIQUE: Informed consent was obtained from the patient following explanation of the procedure, risks, benefits and alternatives. The patient understands, agrees and consents for the procedure. All questions were addressed. A time out was performed.  Maximal barrier sterile technique utilized including caps, mask, sterile gowns, sterile gloves, large sterile drape, hand hygiene, and Betadine skin prep. A planning axial CT scan was performed first without contrast, and then with contrast. The right renal mass was  successfully identified. The maximal diameter is approximately 3 cm. Suitable skin entry sites were selected and marked.  Small dermatotomies were made with a 11 blade. Using intermittent CT fluoroscopic guidance, a total of 3 Endocare PCS 24 probes were advanced into the tumor using a triangular configuration. Once probe placement was confirmed and satisfactory with axial CT imaging, a 17 gauge introducer needle was then advanced to the margin of the mass and two 18 gauge core biopsies were coaxially obtained. The biopsy specimens were placed in formalin and delivered to pathology for further analysis.  Cryoablation was then initiated using a standard 10 min freeze, 8 min thaw and 10 min freeze cycle. Images were obtained at 6 and 10 min during both freeze periods to monitor formation of the ice ball. There is adequate ice coverage of the primary mass. No extension of the ice ball into the posterior abdominal wall. Minimal perilesional hemorrhage.  After the final followup, the probes were removed. Final axial CT imaging demonstrates no evidence of acute complication.  COMPLICATIONS: None  IMPRESSION: 1. Successful CT-guided biopsy of right renal mass. 2. Successful percutaneous cryoablation  of right renal mass.  PLAN: 1. Clinic visit in 2-4 weeks with lab evaluation of renal function. 2. Repeat MRI in 3 months at the time of the second clinic visit.  Signed,  Criselda Peaches, MD  [facility]  [facility]   Electronically Signed   By: [name] M.D.   On: 02/07/2014 12:41   Ct Biopsy  02/07/2014   CLINICAL DATA:  69 year old female with a 3 cm enhancing mass the posterior aspect of the right kidney concerning for renal neoplasm, likely renal cell carcinoma. She presents today for percutaneous CT-guided biopsy to be followed immediately by cryoablation.  EXAM: CT GUIDED ABLATION; CT BIOPSY  Date: 02/07/2014  PROCEDURE: 1. CT-guided biopsy of right renal  mass 2. CT-guided cryoablation of right renal mass Interventional Radiologist:  Criselda Peaches, MD  ANESTHESIA/SEDATION: General anesthesia provided by the anesthesiology service.  MEDICATIONS: 2 g Ancef administered intravenously within 1 hr of skin incision  FLUOROSCOPY TIME:  None  CONTRAST:  151mL OMNIPAQUE IOHEXOL 300 MG/ML  SOLN  TECHNIQUE: Informed consent was obtained from the patient following explanation of the procedure, risks, benefits and alternatives. The patient understands, agrees and consents for the procedure. All questions were addressed. A time out was performed.  Maximal barrier sterile technique utilized including caps, mask, sterile gowns, sterile gloves, large sterile drape, hand hygiene, and Betadine skin prep. A planning axial CT scan was performed first without contrast, and then with contrast. The right renal mass was successfully identified. The maximal diameter is approximately 3 cm. Suitable skin entry sites were selected and marked.  Small dermatotomies were made with a 11 blade. Using intermittent CT fluoroscopic guidance, a total of 3 Endocare PCS 24 probes were advanced into the tumor using a triangular configuration. Once probe placement was confirmed and satisfactory with axial CT imaging, a 17 gauge introducer needle was then advanced to the margin of the mass and two 18 gauge core biopsies were coaxially obtained. The biopsy specimens were placed in formalin and delivered to pathology for further analysis.  Cryoablation was then initiated using a standard 10 min freeze, 8 min thaw and 10 min freeze cycle. Images were obtained at 6 and 10 min during both freeze periods to monitor formation of the ice ball. There is adequate ice coverage of the primary mass. No extension of the ice ball into the posterior abdominal wall. Minimal perilesional hemorrhage.  After the final followup, the probes were removed. Final axial CT imaging demonstrates no evidence of acute complication.   COMPLICATIONS: None  IMPRESSION: 1. Successful CT-guided biopsy of right renal mass. 2. Successful percutaneous cryoablation of right renal mass.  PLAN: 1. Clinic visit in 2-4 weeks with lab evaluation of renal function. 2. Repeat MRI in 3 months at the time of the second clinic visit.  Signed,  Criselda Peaches, MD  [facility]  [facility]   Electronically Signed   By: [name] M.D.   On: 02/07/2014 12:41    Labs:  CBC:  Recent Labs  12/05/13 0904 02/04/14 1525 02/08/14 0433 02/20/14 1616  WBC 8.9 9.1 10.0 13.4*  HGB 14.2 13.0 11.0* 11.7*  HCT 43.8 40.7 35.3* 35.3*  PLT 262 261 208 552*    COAGS:  Recent Labs  12/05/13 0904 02/04/14 1525  INR 1.02 0.94  APTT 24 28    BMP:  Recent Labs  11/13/13 0337 02/04/14 1525 02/07/14 0655 02/08/14 0433 02/20/14 1230  NA 144  143  --  139 141  K 4.1 3.1* 3.7 4.8 3.7  CL 102 108  --  103 100  CO2 26 28  --  30 28  GLUCOSE 162* 88  --  127* 107*  BUN 15 23  --  16 10  CALCIUM 9.5 8.7  --  8.7 9.7  CREATININE 0.58 0.46*  --  0.42* 0.50  GFRNONAA >90 >90  --  >90  --   GFRAA >90 >90  --  >90  --     LIVER FUNCTION TESTS:  Recent Labs  11/13/13 0337  BILITOT 0.3  AST 21  ALT 19  ALKPHOS 52  PROT 7.4  ALBUMIN 4.4    TUMOR MARKERS: No results for input(s): AFPTM, CEA, CA199, CHROMGRNA in the last 8760 hours.  Assessment and Plan:  Acute onset post procedural pain beginning 9 days status post percutaneous cryoablation of a lipid poor right renal angiomyolipoma.  Her symptoms have slowly been improving over the past 4 days.  Clinical and CT evaluation today is consistent with a small amount of subacute spontaneous hemorrhage in the posterior pararenal space. There is some mild enhancement likely reflecting resolving hematoma. This hematoma could also explain the mild elevation in her white blood cell count as well as her occasional chills but no overt fever. I  do not see a urinoma or other complicating feature.  There is some mild bibasilar atelectasis.   I refilled her hydrocodone/acetaminophen prescription to help her get through the next few days and counseled her that these symptoms should be self-limited and continue to improve. If she experiences any additional increase in pain, or progression to overt fever we would need to know about it right away as she may be experiencing recurrent bleeding or developing an infection.  I will see her as scheduled later this month. I will recheck her BMP and CBC at that time. She knows to call if she experiences any clinical deterioration in the interim.  SignedJacqulynn Cadet 02/20/2014, 5:21 PM   I spent a total of 25 minutes face to face in clinical consultation, greater than 50% of which was counseling/coordinating care for acute post procedural pain.

## 2014-02-21 ENCOUNTER — Other Ambulatory Visit: Payer: Self-pay | Admitting: Emergency Medicine

## 2014-02-21 ENCOUNTER — Other Ambulatory Visit (HOSPITAL_COMMUNITY): Payer: Self-pay | Admitting: Interventional Radiology

## 2014-02-21 DIAGNOSIS — N2889 Other specified disorders of kidney and ureter: Secondary | ICD-10-CM

## 2014-02-21 DIAGNOSIS — D3001 Benign neoplasm of right kidney: Secondary | ICD-10-CM

## 2014-03-03 ENCOUNTER — Ambulatory Visit: Payer: PPO | Admitting: Hematology & Oncology

## 2014-03-04 ENCOUNTER — Other Ambulatory Visit: Payer: PPO

## 2014-03-04 ENCOUNTER — Inpatient Hospital Stay: Admission: RE | Admit: 2014-03-04 | Payer: PPO | Source: Ambulatory Visit

## 2014-03-17 LAB — CBC
HEMATOCRIT: 38.7 % (ref 36.0–46.0)
Hemoglobin: 12.2 g/dL (ref 12.0–15.0)
MCH: 27.5 pg (ref 26.0–34.0)
MCHC: 31.5 g/dL (ref 30.0–36.0)
MCV: 87.2 fL (ref 78.0–100.0)
MPV: 8.8 fL (ref 8.6–12.4)
Platelets: 477 10*3/uL — ABNORMAL HIGH (ref 150–400)
RBC: 4.44 MIL/uL (ref 3.87–5.11)
RDW: 15.3 % (ref 11.5–15.5)
WBC: 8.7 10*3/uL (ref 4.0–10.5)

## 2014-03-18 LAB — BASIC METABOLIC PANEL
BUN: 11 mg/dL (ref 6–23)
CO2: 27 meq/L (ref 19–32)
CREATININE: 0.49 mg/dL — AB (ref 0.50–1.10)
Calcium: 9.1 mg/dL (ref 8.4–10.5)
Chloride: 96 mEq/L (ref 96–112)
GLUCOSE: 83 mg/dL (ref 70–99)
Potassium: 4 mEq/L (ref 3.5–5.3)
Sodium: 139 mEq/L (ref 135–145)

## 2014-03-20 ENCOUNTER — Ambulatory Visit
Admission: RE | Admit: 2014-03-20 | Discharge: 2014-03-20 | Disposition: A | Discharge status: Home / Self Care | Payer: PPO | Source: Ambulatory Visit | Attending: Interventional Radiology | Admitting: Interventional Radiology

## 2014-03-20 DIAGNOSIS — D1771 Benign lipomatous neoplasm of kidney: Secondary | ICD-10-CM | POA: Insufficient documentation

## 2014-03-20 DIAGNOSIS — D3001 Benign neoplasm of right kidney: Secondary | ICD-10-CM

## 2014-03-20 HISTORY — PX: IR GENERIC HISTORICAL: IMG1180011

## 2014-03-20 NOTE — Progress Notes (Signed)
Chief Complaint: Chief Complaint  Patient presents with  . Follow-up    6 wks Cryoablation of Right Renal Mass     Referring Physician(s): Sharleen Szczesny  History of Present Illness: Marie Thomas is a 69 y.o. female with an enhancing right renal mass highly suspicious for renal neoplasm. She underwent simultaneous biopsy and percutaneous cryoablation which was performed on 02/07/2014.  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 Ultimately, this lesion would have required treatment as angiomyolipoma is over 4 cm carry an increased risk of spontaneous and potentially life-threatening hemorrhage secondary to aneurysm formation within the intra-lesional blood vessels.   Her immediate post procedural course was complicated by delayed right retroperitoneal hemorrhage approximately 1.5 weeks post procedure. This ultimately proved to be self limited and required no additional treatment other than management of the associated pain.   Today, Marie Thomas is doing well.  Her pain continues to improve and has nearly resolved. She is taking her hydrocodone only at night and using Tylenol during the day. She has almost out of hydrocodone. I will refill her prescription to cover her for another week.  She continues to experience intermittent chills without fever.  Denies hematuria, dysuria or other new symptoms.  Her previously noted mild  leukocytosis is completely resolved.  Past Medical History  Diagnosis Date  . Polio   . 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 H/O steroid therapy     Steroid use -last used 1 week ago  . Pain at rest     abdominal pain, right side- intermittent"dx. right renal mass"  . Headache     migraines-have occurred less since retired '99.  . Neuromuscular disorder     polio- no mobility lower extremities  bilaterally    Past Surgical History  Procedure Laterality Date  . Abdominal hysterectomy    . Leg tendon surgery      age 56 or 69 yrs old    Allergies: Review of patient's allergies indicates no known allergies.  Medications: Prior to Admission medications   Medication Sig Start Date End Date Taking? Authorizing Provider  acetaminophen (TYLENOL) 500 MG tablet Take 500 mg by mouth every 6 (six) hours as needed.   Yes Historical Provider, MD  alendronate (FOSAMAX) 70 MG tablet Take 70 mg by mouth once a week. On Thursday 08/22/13  Yes Historical Provider, MD  Cholecalciferol (VITAMIN D) 2000 UNITS tablet Take 2,000 Units by mouth daily.   Yes Historical Provider, MD  docusate sodium (COLACE) 100 MG capsule Take 1 capsule (100 mg total) by mouth every 12 (twelve) hours. 11/13/13  Yes Ephraim Hamburger, MD  HYDROcodone-acetaminophen (NORCO/VICODIN) 5-325 MG per tablet Take 1-2 tablets by mouth every 4 (four) hours as needed for moderate pain. 02/08/14  Yes Ascencion Dike, PA-C  oxyCODONE-acetaminophen (PERCOCET) 5-325 MG per tablet Take 1 tablet by mouth every 4 (four) hours as needed for severe pain. Patient not taking: Reported on 02/03/2014 11/13/13   Ephraim Hamburger, MD  predniSONE (DELTASONE) 20 MG tablet Take 2 tablets (40 mg total) by mouth daily with breakfast. Patient not taking: Reported on 02/03/2014 11/15/13   Eliezer Bottom, NP     No family history on file.  History   Social History  . Marital Status: Single    Spouse Name: N/A  . Number of Children: N/A  . Years  of Education: N/A   Social History Main Topics  . Smoking status: Never Smoker   . Smokeless tobacco: Not on file  . Alcohol Use: Yes     Comment: social  . Drug Use: No  . Sexual Activity: Not Currently   Other Topics Concern  . Not on file   Social History Narrative    Review of Systems: A 12 point ROS discussed and pertinent positives are indicated in the HPI above.  All other systems are  negative.  Review of Systems  Vital Signs: BP 140/60 mmHg  Pulse 102  Temp(Src) 97.9 F (36.6 C) (Oral)  Resp 13  SpO2 98%  Physical Exam  Constitutional: She is oriented to person, place, and time. She appears well-developed and well-nourished.  HENT:  Head: Normocephalic and atraumatic.  Eyes: No scleral icterus.  Cardiovascular: Regular rhythm and normal heart sounds.  Tachycardia present.   Pulmonary/Chest: Effort normal and breath sounds normal.  Abdominal: Soft. She exhibits no distension. There is no tenderness.  Neurological: She is alert and oriented to person, place, and time.  Skin: Skin is warm and dry.  Cryoprobe insertion sites well healed.    Psychiatric: She has a normal mood and affect. Her behavior is normal.  Nursing note and vitals reviewed.   Imaging: Ct Abd Wo & W Cm  02/20/2014   CLINICAL DATA:  Right abdominal pain with chills following right renal cryoablation 2 weeks ago. History of hysterectomy and right oophorectomy. Initial encounter.  EXAM: CT ABDOMEN WITHOUT AND WITH CONTRAST  TECHNIQUE: Multidetector CT imaging of the abdomen was performed following the standard protocol before and following the bolus administration of intravenous contrast.  CONTRAST:  125mL OMNIPAQUE IOHEXOL 300 MG/ML  SOLN  COMPARISON:  Cryoablation study 02/07/2014.  CT 11/13/2013.  FINDINGS: Lower chest: Mild atelectasis at both lung bases. No significant pleural or pericardial effusion.  Hepatobiliary: There is mild hepatic steatosis. There is a stable small cyst within the left hepatic lobe. The irregular low-density lesion with central calcifications inferiorly in the right lobe, just posterior to the gallbladder has not significantly changed, measuring 4.0 x 2.6 cm on image 52. This was felt to be a possible fibrotic or postinflammatory process on subsequent MRI. No new liver lesions identified. No evidence of gallstones, gallbladder wall thickening or biliary dilatation.   Pancreas: Unremarkable. No pancreatic ductal dilatation or surrounding inflammatory changes.  Spleen: Normal in size without focal abnormality.  Adrenals/Urinary Tract: Both adrenal glands appear normal.There is a 5.1 x 3.9 cm slightly higher than fluid attenuation collection posteriorly in the mid right kidney within the cryo ablation zone. This demonstrates no enhancement following contrast. No residual mass is apparent. There is a posterior perinephric fluid collection measuring 3.4 cm on image 64. There is some surrounding enhancement which is probably inflammatory. No active bleeding or leakage of contrast from the collecting system is seen on the delayed images. There is no obstruction of the ureter. Underlying parapelvic cysts are present bilaterally. The left kidney demonstrates no acute findings.  Stomach/Bowel: No evidence of bowel wall thickening, distention or surrounding inflammatory change.No intraperitoneal fluid collections identified.  Vascular/Lymphatic: There are no enlarged abdominal lymph nodes. Stable aortoiliac atherosclerosis.  Other: None.  Musculoskeletal: No acute or significant osseous findings.  IMPRESSION: 1. Probable subacute hemorrhage within cryo ablation zone with posterior extension into the posterior perinephric fat. There is peripheral enhancement following contrast, and this could potentially be infected. No evidence of active bleeding or urinoma. 2. No evidence of  ureteral obstruction or residual viable tumor. 3. Increased bibasilar atelectasis. 4. Indeterminate partially calcified liver lesion is unchanged. Attention on follow-up recommended.   Electronically Signed   By: Camie Patience M.D.   On: 02/20/2014 17:28    Labs:  CBC:  Recent Labs  02/04/14 1525 02/08/14 0433 02/20/14 1616 03/17/14 1100  WBC 9.1 10.0 13.4* 8.7  HGB 13.0 11.0* 11.7* 12.2  HCT 40.7 35.3* 35.3* 38.7  PLT 261 208 552* 477*    COAGS:  Recent Labs  12/05/13 0904 02/04/14 1525  INR  1.02 0.94  APTT 24 28    BMP:  Recent Labs  11/13/13 0337 02/04/14 1525 02/07/14 0655 02/08/14 0433 02/20/14 1230 03/17/14 1100  NA 144 143  --  139 141 139  K 4.1 3.1* 3.7 4.8 3.7 4.0  CL 102 108  --  103 100 96  CO2 26 28  --  30 28 27   GLUCOSE 162* 88  --  127* 107* 83  BUN 15 23  --  16 10 11   CALCIUM 9.5 8.7  --  8.7 9.7 9.1  CREATININE 0.58 0.46*  --  0.42* 0.50 0.49*  GFRNONAA >90 >90  --  >90  --   --   GFRAA >90 >90  --  >90  --   --     LIVER FUNCTION TESTS:  Recent Labs  11/13/13 0337  BILITOT 0.3  AST 21  ALT 19  ALKPHOS 52  PROT 7.4  ALBUMIN 4.4    TUMOR MARKERS: No results for input(s): AFPTM, CEA, CA199, CHROMGRNA in the last 8760 hours.  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  Her pain continues to improve. She is taking her hydrocodone only at night and using Tylenol during the day. She has almost out of hydrocodone. I will refill her prescription to cover her for another week.   1.) Refill hydrocodone/acetaminophen for another week's worth of coverage 2.) Follow-up abdominal MRI with and without contrast at 3 months postprocedure (late April 2016). Patient will return to see me in clinic at that time to review the results.  SignedJacqulynn Cadet 03/20/2014, 12:43 PM   I spent a total of  10 Minutes in face to face in clinical consultation, greater than 50% of which was counseling/coordinating care for post cryoablation of lipid poor AML.

## 2014-04-01 ENCOUNTER — Other Ambulatory Visit (HOSPITAL_COMMUNITY): Payer: Self-pay | Admitting: Interventional Radiology

## 2014-04-01 ENCOUNTER — Other Ambulatory Visit: Payer: Self-pay | Admitting: Radiology

## 2014-04-01 DIAGNOSIS — D3001 Benign neoplasm of right kidney: Secondary | ICD-10-CM

## 2014-04-02 ENCOUNTER — Ambulatory Visit
Admission: RE | Admit: 2014-04-02 | Discharge: 2014-04-02 | Disposition: A | Discharge status: Home / Self Care | Payer: PPO | Source: Ambulatory Visit | Attending: Internal Medicine | Admitting: Internal Medicine

## 2014-04-02 ENCOUNTER — Other Ambulatory Visit: Payer: Self-pay | Admitting: Internal Medicine

## 2014-04-02 DIAGNOSIS — R509 Fever, unspecified: Secondary | ICD-10-CM

## 2014-04-02 DIAGNOSIS — R109 Unspecified abdominal pain: Secondary | ICD-10-CM

## 2014-04-22 ENCOUNTER — Other Ambulatory Visit (HOSPITAL_COMMUNITY): Payer: Self-pay | Admitting: Interventional Radiology

## 2014-04-22 ENCOUNTER — Telehealth: Payer: Self-pay | Admitting: Radiology

## 2014-04-22 DIAGNOSIS — N2889 Other specified disorders of kidney and ureter: Secondary | ICD-10-CM

## 2014-04-22 NOTE — Telephone Encounter (Signed)
Patient phoned complaining of Right flank pain.  Has been taking Hydrocodone nightly for pain.  Requested refill of Hydrocodone.  Recently completed Cipro for UTI (Rx'd by Dr Felipa Eth).  States that her urine is clear and denies urinary frequency or burning with urination.  States further that she has phoned Dr [employer] office and was told to call our office for current Sx.  States that she has experienced chills, sweats.  Febrile 100.6.  Main complaint is Right flank pain.    Dr Kathlene Cote notified.  CT scheduled for 04/23/14 at 8:30 am.  To see Dr Annamaria Boots at 10:30 am.  (Dr Laurence Ferrari out of the office this week).  Marie Regina Riki Rusk, RN 04/22/2014 5:13 PM

## 2014-04-23 ENCOUNTER — Encounter (HOSPITAL_COMMUNITY): Payer: Self-pay

## 2014-04-23 ENCOUNTER — Ambulatory Visit (HOSPITAL_COMMUNITY)
Admission: RE | Admit: 2014-04-23 | Discharge: 2014-04-23 | Disposition: A | Discharge status: Home / Self Care | Payer: PPO | Source: Ambulatory Visit | Attending: Interventional Radiology | Admitting: Interventional Radiology

## 2014-04-23 ENCOUNTER — Ambulatory Visit
Admission: RE | Admit: 2014-04-23 | Discharge: 2014-04-23 | Disposition: A | Discharge status: Home / Self Care | Payer: PPO | Source: Ambulatory Visit | Attending: Interventional Radiology | Admitting: Interventional Radiology

## 2014-04-23 ENCOUNTER — Inpatient Hospital Stay (HOSPITAL_COMMUNITY)
Admission: RE | Admit: 2014-04-23 | Discharge: 2014-04-25 | DRG: 853 | Disposition: A | Discharge status: Home / Self Care | Payer: PPO | Source: Ambulatory Visit | Attending: Internal Medicine | Admitting: Internal Medicine

## 2014-04-23 ENCOUNTER — Ambulatory Visit (HOSPITAL_COMMUNITY)
Admission: RE | Admit: 2014-04-23 | Discharge: 2014-04-23 | Disposition: A | Discharge status: Home / Self Care | Payer: PPO | Source: Ambulatory Visit | Attending: Diagnostic Radiology | Admitting: Diagnostic Radiology

## 2014-04-23 ENCOUNTER — Other Ambulatory Visit (HOSPITAL_COMMUNITY): Payer: Self-pay | Admitting: Interventional Radiology

## 2014-04-23 DIAGNOSIS — Z8612 Personal history of poliomyelitis: Secondary | ICD-10-CM | POA: Diagnosis present

## 2014-04-23 DIAGNOSIS — D649 Anemia, unspecified: Secondary | ICD-10-CM | POA: Diagnosis not present

## 2014-04-23 DIAGNOSIS — E860 Dehydration: Secondary | ICD-10-CM | POA: Diagnosis present

## 2014-04-23 DIAGNOSIS — D1771 Benign lipomatous neoplasm of kidney: Secondary | ICD-10-CM | POA: Diagnosis present

## 2014-04-23 DIAGNOSIS — IMO0002 Reserved for concepts with insufficient information to code with codable children: Secondary | ICD-10-CM

## 2014-04-23 DIAGNOSIS — E876 Hypokalemia: Secondary | ICD-10-CM | POA: Insufficient documentation

## 2014-04-23 DIAGNOSIS — G8220 Paraplegia, unspecified: Secondary | ICD-10-CM | POA: Diagnosis present

## 2014-04-23 DIAGNOSIS — D72829 Elevated white blood cell count, unspecified: Secondary | ICD-10-CM | POA: Diagnosis not present

## 2014-04-23 DIAGNOSIS — N151 Renal and perinephric abscess: Secondary | ICD-10-CM | POA: Diagnosis present

## 2014-04-23 DIAGNOSIS — N399 Disorder of urinary system, unspecified: Secondary | ICD-10-CM

## 2014-04-23 DIAGNOSIS — Z79899 Other long term (current) drug therapy: Secondary | ICD-10-CM

## 2014-04-23 DIAGNOSIS — N2889 Other specified disorders of kidney and ureter: Secondary | ICD-10-CM

## 2014-04-23 DIAGNOSIS — I10 Essential (primary) hypertension: Secondary | ICD-10-CM | POA: Diagnosis present

## 2014-04-23 DIAGNOSIS — A419 Sepsis, unspecified organism: Principal | ICD-10-CM | POA: Diagnosis present

## 2014-04-23 DIAGNOSIS — N368 Other specified disorders of urethra: Secondary | ICD-10-CM | POA: Diagnosis present

## 2014-04-23 DIAGNOSIS — R509 Fever, unspecified: Secondary | ICD-10-CM | POA: Diagnosis present

## 2014-04-23 HISTORY — DX: Essential (primary) hypertension: I10

## 2014-04-23 LAB — COMPREHENSIVE METABOLIC PANEL
ALT: 22 U/L (ref 0–35)
AST: 23 U/L (ref 0–37)
Albumin: 3.2 g/dL — ABNORMAL LOW (ref 3.5–5.2)
Alkaline Phosphatase: 109 U/L (ref 39–117)
Anion gap: 13 (ref 5–15)
BUN: 26 mg/dL — ABNORMAL HIGH (ref 6–23)
CO2: 27 mmol/L (ref 19–32)
Calcium: 9.3 mg/dL (ref 8.4–10.5)
Chloride: 97 mmol/L (ref 96–112)
Creatinine, Ser: 0.95 mg/dL (ref 0.50–1.10)
GFR, EST AFRICAN AMERICAN: 70 mL/min — AB (ref >90)
GFR, EST NON AFRICAN AMERICAN: 60 mL/min — AB (ref >90)
GLUCOSE: 105 mg/dL — AB (ref 70–99)
POTASSIUM: 3.6 mmol/L (ref 3.5–5.1)
SODIUM: 137 mmol/L (ref 135–145)
Total Bilirubin: 0.8 mg/dL (ref 0.3–1.2)
Total Protein: 7.2 g/dL (ref 6.0–8.3)

## 2014-04-23 LAB — CBC
HEMATOCRIT: 37.1 % (ref 36.0–46.0)
Hemoglobin: 11.8 g/dL — ABNORMAL LOW (ref 12.0–15.0)
MCH: 25.9 pg — AB (ref 26.0–34.0)
MCHC: 31.8 g/dL (ref 30.0–36.0)
MCV: 81.5 fL (ref 78.0–100.0)
Platelets: 273 10*3/uL (ref 150–400)
RBC: 4.55 MIL/uL (ref 3.87–5.11)
RDW: 15.9 % — ABNORMAL HIGH (ref 11.5–15.5)
WBC: 17.4 10*3/uL — ABNORMAL HIGH (ref 4.0–10.5)

## 2014-04-23 LAB — POCT I-STAT, CHEM 8
BUN: 25 mg/dL — AB (ref 6–23)
CALCIUM ION: 1.15 mmol/L (ref 1.13–1.30)
CREATININE: 0.9 mg/dL (ref 0.50–1.10)
Chloride: 96 mmol/L (ref 96–112)
GLUCOSE: 112 mg/dL — AB (ref 70–99)
HCT: 41 % (ref 36.0–46.0)
HEMOGLOBIN: 13.9 g/dL (ref 12.0–15.0)
Potassium: 3.5 mmol/L (ref 3.5–5.1)
Sodium: 135 mmol/L (ref 135–145)
TCO2: 25 mmol/L (ref 0–100)

## 2014-04-23 LAB — APTT: APTT: 25 s (ref 24–37)

## 2014-04-23 LAB — PROTIME-INR
INR: 1.13 (ref 0.00–1.49)
PROTHROMBIN TIME: 14.7 s (ref 11.6–15.2)

## 2014-04-23 MED ORDER — SODIUM CHLORIDE 0.9 % IV SOLN
INTRAVENOUS | Status: DC
  Administered 2014-04-23: 14:00:00 via INTRAVENOUS

## 2014-04-23 MED ORDER — VANCOMYCIN HCL IN DEXTROSE 1-5 GM/200ML-% IV SOLN
1000.0000 mg | INTRAVENOUS | Status: AC
  Administered 2014-04-23: 1000 mg via INTRAVENOUS
  Filled 2014-04-23: qty 200

## 2014-04-23 MED ORDER — MIDAZOLAM HCL 2 MG/2ML IJ SOLN
INTRAMUSCULAR | Status: AC | PRN
  Administered 2014-04-23: 0.5 mg via INTRAVENOUS

## 2014-04-23 MED ORDER — SODIUM CHLORIDE 0.9 % IV SOLN
INTRAVENOUS | Status: AC
  Administered 2014-04-23 – 2014-04-24 (×2): via INTRAVENOUS

## 2014-04-23 MED ORDER — PIPERACILLIN-TAZOBACTAM 3.375 G IVPB
3.3750 g | Freq: Three times a day (TID) | INTRAVENOUS | Status: DC
  Administered 2014-04-23 – 2014-04-25 (×5): 3.375 g via INTRAVENOUS
  Filled 2014-04-23 (×7): qty 50

## 2014-04-23 MED ORDER — MIDAZOLAM HCL 2 MG/2ML IJ SOLN
INTRAMUSCULAR | Status: AC
  Filled 2014-04-23: qty 6

## 2014-04-23 MED ORDER — VITAMIN D3 25 MCG (1000 UNIT) PO TABS
2000.0000 [IU] | ORAL_TABLET | Freq: Every day | ORAL | Status: DC
  Administered 2014-04-24 – 2014-04-25 (×2): 2000 [IU] via ORAL
  Filled 2014-04-23 (×4): qty 2

## 2014-04-23 MED ORDER — ALBUTEROL SULFATE (2.5 MG/3ML) 0.083% IN NEBU: 2.5000 mg | INHALATION_SOLUTION | RESPIRATORY_TRACT | Status: DC | PRN

## 2014-04-23 MED ORDER — DOCUSATE SODIUM 100 MG PO CAPS
100.0000 mg | ORAL_CAPSULE | Freq: Two times a day (BID) | ORAL | Status: DC
  Administered 2014-04-23 – 2014-04-25 (×4): 100 mg via ORAL
  Filled 2014-04-23 (×4): qty 1

## 2014-04-23 MED ORDER — ACETAMINOPHEN 650 MG RE SUPP: 650.0000 mg | Freq: Four times a day (QID) | RECTAL | Status: DC | PRN

## 2014-04-23 MED ORDER — ONDANSETRON HCL 4 MG/2ML IJ SOLN: 4.0000 mg | Freq: Four times a day (QID) | INTRAMUSCULAR | Status: DC | PRN

## 2014-04-23 MED ORDER — PIPERACILLIN-TAZOBACTAM 3.375 G IVPB
3.3750 g | Freq: Three times a day (TID) | INTRAVENOUS | Status: DC
  Administered 2014-04-23: 3.375 g via INTRAVENOUS
  Filled 2014-04-23 (×6): qty 50

## 2014-04-23 MED ORDER — ONDANSETRON HCL 4 MG PO TABS: 4.0000 mg | ORAL_TABLET | Freq: Four times a day (QID) | ORAL | Status: DC | PRN

## 2014-04-23 MED ORDER — PIPERACILLIN-TAZOBACTAM 3.375 G IVPB
3.3750 g | Freq: Three times a day (TID) | INTRAVENOUS | Status: DC
  Filled 2014-04-23: qty 50

## 2014-04-23 MED ORDER — IOHEXOL 300 MG/ML  SOLN
100.0000 mL | Freq: Once | INTRAMUSCULAR | Status: AC | PRN
Start: 2014-04-23 — End: 2014-04-23
  Administered 2014-04-23: 100 mL via INTRAVENOUS

## 2014-04-23 MED ORDER — SODIUM CHLORIDE 0.9 % IJ SOLN
3.0000 mL | Freq: Two times a day (BID) | INTRAMUSCULAR | Status: DC
  Administered 2014-04-23: 3 mL via INTRAVENOUS

## 2014-04-23 MED ORDER — FENTANYL CITRATE 0.05 MG/ML IJ SOLN
INTRAMUSCULAR | Status: AC | PRN
  Administered 2014-04-23: 25 ug via INTRAVENOUS

## 2014-04-23 MED ORDER — ENOXAPARIN SODIUM 40 MG/0.4ML ~~LOC~~ SOLN
40.0000 mg | SUBCUTANEOUS | Status: DC
  Administered 2014-04-23 – 2014-04-24 (×2): 40 mg via SUBCUTANEOUS
  Filled 2014-04-23 (×2): qty 0.4

## 2014-04-23 MED ORDER — FENTANYL CITRATE 0.05 MG/ML IJ SOLN
INTRAMUSCULAR | Status: AC
  Filled 2014-04-23: qty 4

## 2014-04-23 MED ORDER — IOHEXOL 300 MG/ML  SOLN
10.0000 mL | Freq: Once | INTRAMUSCULAR | Status: AC | PRN
  Administered 2014-04-23: 20 mL

## 2014-04-23 MED ORDER — HYDROCODONE-ACETAMINOPHEN 5-325 MG PO TABS: 1.0000 | ORAL_TABLET | ORAL | Status: DC | PRN

## 2014-04-23 MED ORDER — HYDROMORPHONE HCL 1 MG/ML IJ SOLN
0.5000 mg | INTRAMUSCULAR | Status: DC | PRN
  Administered 2014-04-23 – 2014-04-24 (×3): 0.5 mg via INTRAVENOUS
  Filled 2014-04-23 (×3): qty 1

## 2014-04-23 MED ORDER — LIDOCAINE HCL 1 % IJ SOLN
INTRAMUSCULAR | Status: AC
  Filled 2014-04-23: qty 20

## 2014-04-23 MED ORDER — ACETAMINOPHEN 325 MG PO TABS: 650.0000 mg | ORAL_TABLET | Freq: Four times a day (QID) | ORAL | Status: DC | PRN

## 2014-04-23 MED ORDER — HYDROCODONE-ACETAMINOPHEN 5-325 MG PO TABS
1.0000 | ORAL_TABLET | ORAL | Status: DC | PRN
  Administered 2014-04-24 – 2014-04-25 (×3): 2 via ORAL
  Filled 2014-04-23 (×3): qty 2

## 2014-04-23 NOTE — Progress Notes (Addendum)
Patient ID: Marie Thomas, female   DOB: July 27, 1945, 69 y.o.   MRN: [id number] Patient has presented to [facility] today for image guided placement of a drain into a right perinephric fluid collection/urinoma. See today's H&P from Dr. Annamaria Boots for additional details. Patient's only complaint at this time is persistent right flank discomfort. She is currently afebrile but tachycardic with blood pressure 115/58 and oxygen saturations  98% on room air. Lab studies are pending. Details/risks of procedure, including but not limited to internal bleeding, infection, need for additional urologic procedures, discussed with patient and brother with their understanding and consent. [facility] will admit patient post procedure and urology consult will be obtained (Dr. Diona Fanti).

## 2014-04-23 NOTE — Progress Notes (Signed)
Patient ID: URIAH TRUEBA, female   DOB: [birth year], 69 y.o.   MRN: [id number]    Chief Complaint: Chief Complaint  Patient presents with  . Follow-up    3.5 mos s/p Cryoablation of Right Renal Mass    . Flank Pain    Right side    Referring Physician(s): Mccullough, Ennever  History of Present Illness: ASHLE STIEF is a 69 y.o. female status post right renal mass cryoablation and biopsy performed 02/07/2014. Biopsy results demonstrated a lipid poor angiomyolipoma. No malignant component. Since the procedure, the patient has had a prolonged recovery with intermittent progressive right abdominal and flank pain. Over the weekend, she complains of worsening right abdominal flank pain and discomfort. No associated hematuria or dysuria. She had a low-grade fever yesterday of 100.6. Because of the persistent flank pain and fever, she had a repeat CT with contrast earlier today. This demonstrated an enlarging right perinephric fluid collection with an active urine leak from the superior calyx. Findings are concerning for a urinoma, possibly superinfected. Mild enhancement of the collecting system. No significant hydronephrosis.  Past Medical History  Diagnosis Date  . Polio   . 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 H/O steroid therapy     Steroid use -last used 1 week ago  . Pain at rest     abdominal pain, right side- intermittent"dx. right renal mass"  . Headache     migraines-have occurred less since retired '99.  . Neuromuscular disorder     polio- no mobility lower extremities bilaterally  . Hypertension     Past Surgical History  Procedure Laterality Date  . Abdominal hysterectomy    . Leg tendon surgery      age 66 or 69 yrs old    Allergies: Review of patient's allergies indicates no known allergies.  Medications: Prior to Admission medications   Medication Sig Start  Date End Date Taking? Authorizing Provider  acetaminophen (TYLENOL) 500 MG tablet Take 500 mg by mouth every 6 (six) hours as needed.    Historical Provider, MD  alendronate (FOSAMAX) 70 MG tablet Take 70 mg by mouth once a week. On Thursday 08/22/13   Historical Provider, MD  Cholecalciferol (VITAMIN D) 2000 UNITS tablet Take 2,000 Units by mouth daily.    Historical Provider, MD  docusate sodium (COLACE) 100 MG capsule Take 1 capsule (100 mg total) by mouth every 12 (twelve) hours. 11/13/13   Sherwood Gambler, MD  HYDROcodone-acetaminophen (NORCO/VICODIN) 5-325 MG per tablet Take 1-2 tablets by mouth every 4 (four) hours as needed for moderate pain. 02/08/14   Ascencion Dike, PA-C  oxyCODONE-acetaminophen (PERCOCET) 5-325 MG per tablet Take 1 tablet by mouth every 4 (four) hours as needed for severe pain. Patient not taking: Reported on 02/03/2014 11/13/13   Sherwood Gambler, MD  predniSONE (DELTASONE) 20 MG tablet Take 2 tablets (40 mg total) by mouth daily with breakfast. Patient not taking: Reported on 02/03/2014 11/15/13   Eliezer Bottom, NP     No family history on file.  History   Social History  . Marital Status: Single    Spouse Name: N/A  . Number of Children: N/A  . Years of Education: N/A   Social History Main Topics  . Smoking status: Never Smoker   . Smokeless tobacco: Not on file  . Alcohol Use: Yes     Comment: social  . Drug Use: No  .  Sexual Activity: Not Currently   Other Topics Concern  . Not on file   Social History Narrative     Review of Systems: A 12 point ROS discussed and pertinent positives are indicated in the HPI above.  All other systems are negative.  Review of Systems  Constitutional: Positive for fever and fatigue.  Respiratory: Negative for cough, choking, chest tightness and shortness of breath.   Cardiovascular: Negative for chest pain and palpitations.  Gastrointestinal: Negative for abdominal distention.  Genitourinary: Positive for flank  pain. Negative for dysuria, urgency, hematuria and difficulty urinating.    Vital Signs: BP 95/53 mmHg  Pulse 113  Temp(Src) 98 F (36.7 C) (Oral)  Resp 13  SpO2 95%  Physical Exam  Constitutional:  Ill-appearing frail female in a wheelchair.  Cardiovascular: Normal rate, regular rhythm and normal heart sounds.   No murmur heard. Pulmonary/Chest: Effort normal and breath sounds normal. No respiratory distress. She has no wheezes. She has no rales.  Abdominal: Soft. Bowel sounds are normal. She exhibits no distension.  Positive for right flank CVA tenderness.  Skin: Skin is warm and dry. No rash noted. No erythema.  Psychiatric: She has a normal mood and affect. Her behavior is normal. Judgment and thought content normal.    Imaging: US Renal  04/02/2014   CLINICAL DATA:  Fever and right flank pain. Right renal cryoablation January 2016. Question renal infection.  EXAM: RENAL/URINARY TRACT ULTRASOUND COMPLETE  COMPARISON:  02/20/2014 abdominal CT  FINDINGS: Right Kidney:  Length: 10.4 cm. There is a ovoid hilar cyst which correlates with known pelvic cysts. There is no indication of abscess or subcapsular fluid collection. No asymmetric enlargement suggestive of a generalized pyelonephritis. No hydronephrosis.  Left Kidney:  Length: 10.7 cm. Pelvic cysts; no hydronephrosis. No evidence of mass.  Bladder:  Appears normal for degree of bladder distention.  IMPRESSION: 1. No indication of right renal infection. 2. Bilateral pelvic cysts.  No hydronephrosis.   Electronically Signed   By: Monte Fantasia M.D.   On: 04/02/2014 16:43   Ct Abd Wo & W Cm  04/23/2014   CLINICAL DATA:  Persistent flank pain. History of renal cryoablation with postprocedural hematoma.  EXAM: CT ABDOMEN WITHOUT AND WITH CONTRAST  TECHNIQUE: Multidetector CT imaging of the abdomen was performed following the standard protocol before and following the bolus administration of intravenous contrast.  CONTRAST:  140mL  OMNIPAQUE IOHEXOL 300 MG/ML  SOLN  COMPARISON:  CT scan 02/20/2014  FINDINGS: Lower chest: The lung bases demonstrate bibasilar atelectasis. No focal pulmonary lesion or pleural effusion. The heart is normal in size. No pericardial effusion. The distal esophagus is grossly normal.  Hepatobiliary: Stable mild intrahepatic biliary dilatation. Stable left hepatic lobe cyst. The complex calcified lesion in the right hepatic lobe in segment 5 is unchanged. Continued observation is suggested. No common bile duct dilatation.  Pancreas: Normal  Spleen: Normal  Adrenals/Urinary Tract: The left adrenal gland and left kidney are normal except for stable parapelvic and parenchymal renal cysts.  The right kidney also demonstrates stable parapelvic and parenchymal renal cysts. I do not see any persistent enhancing renal mass but there is an enlarging perinephric fluid collection which is displacing the kidney anteriorly and inferiorly. It previously measured 5.0 x 3.9 cm and now measures 8.7 x 8.6 cm. There is irregular enhancement around the fluid collection. The delayed renal images demonstrate leaking contrast into this fluid collection consistent with a enlarging urinoma.  Stomach/Bowel: The stomach, duodenum, visualized small bowel  and visualized colon are grossly normal.  Vascular/Lymphatic: No mesenteric or retroperitoneal mass or adenopathy. Small scattered lymph nodes are stable. The aorta and branch vessels are patent. The major venous structures are patent.  Musculoskeletal: No significant bony findings.  IMPRESSION: 1. Enlarging right perinephric fluid collection consistent with a urinoma with active leaking from a superior posterior calyx demonstrated on the delayed images. 2. Stable complex calcified right hepatic lesion. Recommend continued observation. Findings called to Dr. Annamaria Boots who will be seeing the patient in the IR Clinic today.   Electronically Signed   By: Marijo Sanes M.D.   On: 04/23/2014 10:40     Labs:  CBC:  Recent Labs  02/04/14 1525 02/08/14 0433 02/20/14 1616 03/17/14 1100 04/23/14 0901  WBC 9.1 10.0 13.4* 8.7  --   HGB 13.0 11.0* 11.7* 12.2 13.9  HCT 40.7 35.3* 35.3* 38.7 41.0  PLT 261 208 552* 477*  --     COAGS:  Recent Labs  12/05/13 0904 02/04/14 1525  INR 1.02 0.94  APTT 24 28    BMP:  Recent Labs  11/13/13 0337 02/04/14 1525  02/08/14 0433 02/20/14 1230 03/17/14 1100 04/23/14 0901  NA 144 143  --  139 141 139 135  K 4.1 3.1*  < > 4.8 3.7 4.0 3.5  CL 102 108  --  103 100 96 96  CO2 26 28  --  30 28 27   --   GLUCOSE 162* 88  --  127* 107* 83 112*  BUN 15 23  --  16 10 11  25*  CALCIUM 9.5 8.7  --  8.7 9.7 9.1  --   CREATININE 0.58 0.46*  --  0.42* 0.50 0.49* 0.90  GFRNONAA >90 >90  --  >90  --   --   --   GFRAA >90 >90  --  >90  --   --   --   < > = values in this interval not displayed.  LIVER FUNCTION TESTS:  Recent Labs  11/13/13 0337  BILITOT 0.3  AST 21  ALT 19  ALKPHOS 52  PROT 7.4  ALBUMIN 4.4     Assessment and Plan:  3 months status post right renal mass cryoablation with worsening right flank pain and low-grade fever. Repeat CT demonstrates an enlarging right perinephric fluid collection with a urine leak. Findings compatible with an enlarging urinoma, possibly superinfected. No associated hydronephrosis.  Plan: Admit to [facility] long hospital for CT-guided urinoma drain insertion. If the sample is grossly infected consider IV antibiotics. Urology will be consult for possible right ureteral stent insertion.   SignedGreggory Keen 04/23/2014, 12:50 PM   I spent a total of25 Minutes in face to face in clinical consultation, greater than 50% of which was counseling/coordinating care for the patient.

## 2014-04-23 NOTE — Procedures (Signed)
Post-Procedure Note  Pre-operative Diagnosis: Right perinephric urinoma      Post-operative Diagnosis: Right perinephric urinoma/abscess   Indications: Pain, fevers and right flank  Procedure Details:   [place] and fluoroscopic guided placement of 12 french drain in right perinephric fluid collection.    Findings: Greater than 300 ml of brown purulent looking fluid removed from the collection.    Complications: None     Condition: stable  Plan: Follow drain output. Send fluid for culture. Being admitted for management of perinephric urinoma/abscess. Urology consult.

## 2014-04-23 NOTE — H&P (Addendum)
History and Physical  [name] QJF:[id number] DOB: Sep 14, 1945 DOA: 04/23/2014  Referring physician: Dr. Greggory Keen, IR PCP: Mathews Argyle, MD  Outpatient Specialists:  1. Urology: Dr. Stephan Minister. 2. IR: Dr. Laurence Ferrari  Chief Complaint: Right-sided flank pain, fevers, chills.  HPI: Marie Thomas is a 69 y.o. female with history of poliomyelitis, paraplegia, lives alone, moves around with help of a wheelchair, able to stand up and transfer to wheelchair, independent with home activities, status post right renal mass cryoablation and biopsy 02/07/14, biopsy demonstrated angiomyolipoma, presented to IR office for follow-up regarding worsening right-sided flank pain, fevers and chills. Patient states that ever since she had the procedure in January, she has had severe significant right flank pain which has been constant, intermittently throbbing, worse with movement or deep breaths, somewhat controlled with pain medications, poor appetite, ongoing intermittent low-grade fevers up to 100.44F and chills which have progressively gotten worse especially over the last 4-5 days. She had 1 episode of nonbloody emesis over the weekend. She has urinary frequency which she attributes to increased oral fluid intake but denies dysuria or hematuria. No cough, chest pain or dyspnea. During follow-up at interventional radiology office, CT abdomen showed enlarging right perinephritic fluid collection with a urine leak, compatible with enlarging urinoma and possibly abscess. Interventional radiology placed a drain in the collection and obtain 300 mL of brown purulent looking fluid. Urology was consulted by IR and hospitalist service was requested to admit for further management. Patient was mildly tachycardic, soft blood pressures in the 90s and had WBC of 17.4.   Review of Systems: All systems reviewed and apart from history of presenting illness, are negative.  Past Medical History    Diagnosis Date  . Polio   . 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 H/O steroid therapy     Steroid use -last used 1 week ago  . Pain at rest     abdominal pain, right side- intermittent"dx. right renal mass"  . Headache     migraines-have occurred less since retired '99.  . Neuromuscular disorder     polio- no mobility lower extremities bilaterally  . Hypertension    Past Surgical History  Procedure Laterality Date  . Abdominal hysterectomy    . Leg tendon surgery      age 71 or 69 yrs old   Social History:  reports that she has never smoked. She does not have any smokeless tobacco history on file. She reports that she drinks alcohol. She reports that she does not use illicit drugs. Single. Lives alone. Moves around with the help of a wheelchair. States that she may not be able to manage her drain if she is discharged on one.  No Known Allergies  Family History  Problem Relation Age of Onset  . Heart disease Brother   . Prostate cancer Brother     Prior to Admission medications   Medication Sig Start Date End Date Taking? Authorizing Provider  acetaminophen (TYLENOL) 500 MG tablet Take 500 mg by mouth every 6 (six) hours as needed for mild pain.    Yes Historical Provider, MD  alendronate (FOSAMAX) 70 MG tablet Take 70 mg by mouth once a week. On Thursday 08/22/13  Yes Historical Provider, MD  Cholecalciferol (VITAMIN D) 2000 UNITS tablet Take 2,000 Units by mouth daily.   Yes Historical Provider, MD  docusate sodium (COLACE) 100 MG capsule  Take 1 capsule (100 mg total) by mouth every 12 (twelve) hours. 11/13/13  Yes Sherwood Gambler, MD  HYDROcodone-acetaminophen (NORCO/VICODIN) 5-325 MG per tablet Take 1-2 tablets by mouth every 4 (four) hours as needed for moderate pain. 02/08/14  Yes Ascencion Dike, PA-C   Physical Exam: Filed Vitals:   04/23/14 1552 04/23/14 1557 04/23/14 1604  04/23/14 1635  BP: 107/69 109/72 122/70 126/73  Pulse: 113 116 117 121  Temp:    98.6 F (37 C)  TempSrc:    Oral  Resp: 17 14 13 15   Height:    5\' 2"  (1.575 m)  Weight:    54.432 kg (120 lb)  SpO2: 98% 100% 100% 98%   Patient was examined with her female RN at bedside.  General exam: Moderately built and nourished pleasant middle-aged female patient, lying comfortably supine in bed in no obvious distress.  Head, eyes and ENT: Nontraumatic and normocephalic. Pupils equally reacting to light and accommodation. Oral mucosa mildly dry  Neck: Supple. No JVD, carotid bruit or thyromegaly.  Lymphatics: No lymphadenopathy.  Respiratory system: Clear to auscultation. No increased work of breathing.  Cardiovascular system: S1 and S2 heard, mild regular tachycardia. No JVD, murmurs, gallops, clicks or pedal edema.  Gastrointestinal system: Abdomen is nondistended, soft and nontender. Normal bowel sounds heard. No organomegaly or masses appreciated. Right flank with drain in place draining bloody purulent liquid in bulb.  Central nervous system: Alert and oriented. No focal neurological deficits.  Extremities: Symmetric 5 x 5 power in upper extremities. Bilateral lower extremities are flaccid with grade 0 x 5 power. Peripheral pulses symmetrically felt.   Skin: No rashes or acute findings.  Musculoskeletal system: Negative exam.  Psychiatry: Pleasant and cooperative.   Labs on Admission:  Basic Metabolic Panel:  Recent Labs Lab 04/23/14 0901 04/23/14 1344  NA 135 137  K 3.5 3.6  CL 96 97  CO2  --  27  GLUCOSE 112* 105*  BUN 25* 26*  CREATININE 0.90 0.95  CALCIUM  --  9.3   Liver Function Tests:  Recent Labs Lab 04/23/14 1344  AST 23  ALT 22  ALKPHOS 109  BILITOT 0.8  PROT 7.2  ALBUMIN 3.2*   No results for input(s): LIPASE, AMYLASE in the last 168 hours. No results for input(s): AMMONIA in the last 168 hours. CBC:  Recent Labs Lab 04/23/14 0901  04/23/14 1344  WBC  --  17.4*  HGB 13.9 11.8*  HCT 41.0 37.1  MCV  --  81.5  PLT  --  273   Cardiac Enzymes: No results for input(s): CKTOTAL, CKMB, CKMBINDEX, TROPONINI in the last 168 hours.  BNP (last 3 results) No results for input(s): PROBNP in the last 8760 hours. CBG: No results for input(s): GLUCAP in the last 168 hours.  Radiological Exams on Admission: Ir Guided Drain W Catheter Placement  04/23/2014   CLINICAL DATA:  69 year old female with history of a right renal lesion cryoablation on 02/07/2014. The patient presents with right flank pain, fevers and tachycardia. Recent CT demonstrated a right perinephric fluid collection which is concerning for a urinoma from a leaking calyx.  EXAM: ULTRASOUND AND FLUOROSCOPIC GUIDED PLACEMENT OF DRAINAGE CATHETER WITHIN THE RIGHT PERINEPHRIC FLUID COLLECTION.  Physician: Stephan Minister. Henn, MD  FLUOROSCOPY TIME:  18 seconds, 6 mGy  MEDICATIONS AND MEDICAL HISTORY: Zosyn 3.375 g. Versed 0.5 mg, fentanyl 25 mcg. A radiology nurse monitored the patient for moderate sedation.  ANESTHESIA/SEDATION: Moderate sedation time: 20 minutes  CONTRAST:  None  PROCEDURE: The procedure was explained to the patient. The risks and benefits of the procedure were discussed and the patient's questions were addressed. Informed consent was obtained from the patient. The patient was placed prone on the interventional table. Ultrasound was used to evaluate the right flank. The right flank was prepped and draped in sterile fashion. Maximal barrier sterile technique was utilized including caps, mask, sterile gowns, sterile gloves, sterile drape, hand hygiene and skin antiseptic.  Skin and soft tissues were anesthetized with 1% lidocaine. An 18 gauge trocar needle was directed into the fluid collection with ultrasound guidance. Cloudy yellow fluid was immediately aspirated. A stiff Amplatz wire was placed. The tract was dilated to accommodate a 12 [place] multipurpose drain. The drain  was advanced into the collection with fluoroscopic guidance. Greater than 300 mL of brown cloudy fluid was removed. The collection was completely decompressed based on ultrasound. Catheter was attached to a suction bulb and sutured to the skin.  FINDINGS: There is a large complex fluid collection in the right perinephric region. Needle and drain were successful placed within the collection. Brown purulent-looking fluid was removed. The collection was decompressed following drain placement.  Fluoroscopic images initially demonstrated residual intravenous contrast within the right kidney with mild to moderate hydronephrosis. In addition, there was contrast within the perinephric collection consistent with the known calyceal leak. Following placement of the drain, there was not only resolution of the perinephric collection but decompression of the right renal collecting system.  Estimated blood loss: Minimal  COMPLICATIONS: None  IMPRESSION: Successful drainage of the right perinephric urinoma. There is concern that the urinoma could be infected and a fluid sample was sent for culture.  Decompression of the perinephric urinoma and right renal collecting system following placement of the urinoma drain.   Electronically Signed   By: Markus Daft M.D.   On: 04/23/2014 16:46   Ct Abd Wo & W Cm  04/23/2014   CLINICAL DATA:  Persistent flank pain. History of renal cryoablation with postprocedural hematoma.  EXAM: CT ABDOMEN WITHOUT AND WITH CONTRAST  TECHNIQUE: Multidetector CT imaging of the abdomen was performed following the standard protocol before and following the bolus administration of intravenous contrast.  CONTRAST:  165mL OMNIPAQUE IOHEXOL 300 MG/ML  SOLN  COMPARISON:  CT scan 02/20/2014  FINDINGS: Lower chest: The lung bases demonstrate bibasilar atelectasis. No focal pulmonary lesion or pleural effusion. The heart is normal in size. No pericardial effusion. The distal esophagus is grossly normal.  Hepatobiliary:  Stable mild intrahepatic biliary dilatation. Stable left hepatic lobe cyst. The complex calcified lesion in the right hepatic lobe in segment 5 is unchanged. Continued observation is suggested. No common bile duct dilatation.  Pancreas: Normal  Spleen: Normal  Adrenals/Urinary Tract: The left adrenal gland and left kidney are normal except for stable parapelvic and parenchymal renal cysts.  The right kidney also demonstrates stable parapelvic and parenchymal renal cysts. I do not see any persistent enhancing renal mass but there is an enlarging perinephric fluid collection which is displacing the kidney anteriorly and inferiorly. It previously measured 5.0 x 3.9 cm and now measures 8.7 x 8.6 cm. There is irregular enhancement around the fluid collection. The delayed renal images demonstrate leaking contrast into this fluid collection consistent with a enlarging urinoma.  Stomach/Bowel: The stomach, duodenum, visualized small bowel and visualized colon are grossly normal.  Vascular/Lymphatic: No mesenteric or retroperitoneal mass or adenopathy. Small scattered lymph nodes are stable. The aorta and branch vessels are patent.  The major venous structures are patent.  Musculoskeletal: No significant bony findings.  IMPRESSION: 1. Enlarging right perinephric fluid collection consistent with a urinoma with active leaking from a superior posterior calyx demonstrated on the delayed images. 2. Stable complex calcified right hepatic lesion. Recommend continued observation. Findings called to Dr. Annamaria Boots who will be seeing the patient in the IR Clinic today.   Electronically Signed   By: Marijo Sanes M.D.   On: 04/23/2014 10:40    EKG: None seen in Epic for today.  Assessment/Plan  69 y.o. female with history of poliomyelitis, paraplegia, lives alone, moves around with help of a wheelchair, able to stand up and transfer to wheelchair, independent with home activities, status post right renal mass cryoablation and biopsy  02/07/14, biopsy demonstrated angiomyolipoma, presented to IR office for follow-up regarding worsening right-sided flank pain, fevers and chills. Repeat imaging showed enlarging right perinephric fluid collection which was drained by interventional radiology and suspicious for perinephric abscess.  Principal Problem:   Urinoma/possible perinephric Abscess - Interventional radiology has placed drain in the fluid collection and will manage same. - Urology consulted by interventional radiology for further management recommendations i.e. urinary leak in the superior posterior calyx seen on CT. - Follow fluid culture results. - As discussed with infectious disease M.D. on call, will treat empirically with IV vancomycin (recent instrumentation history) and Zosyn. - Monitor closely.  Active Problems:   Sepsis - Hydrate with IV fluids and treat empirically with IV vancomycin and Zosyn. - Follow outstanding culture results.    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 - Follow CBCs    Anemia - Likely chronic and stable. - Follow CBCs    History of poliomyelitis & Paraplegia - Patient may need SNF if plan is to discharge with drain for extended period of time.   Stable complex calcified right hepatic lesion - Seen on CT. Continued observation per recommendations.      Code Status: Full  Family Communication: Discussed with brother at bedside.  Disposition Plan: Possible discharge in 3-4 days. DC SNF versus home.   Time spent: 30 minutes  Annaliesa Blann, MD, FACP, FHM. [facility] Pager [phone]  If 7PM-7AM, please contact night-coverage [email or web address] Password [email or web address] 04/23/2014, 5:26 PM

## 2014-04-23 NOTE — Progress Notes (Signed)
3.5 mos s/p Cryoablation of Right Renal Mass.  Has been experiencing Right flank pain, worsening this past weekend.  Taking Hydrocodone for pain.  Awakens several times during hs b/c of pain.  Experienced nausea, chills & sweats on Sunday.  T 100.6 on Monday afternoon.  CT today at [facility].   Pt for admission to [facility] per Dr Annamaria Boots.    Yacine Garriga Riki Rusk, RN 04/23/2014 12:25 PM

## 2014-04-23 NOTE — Consult Note (Signed)
Urology Consult   Physician requesting consult: [place]  Reason for consult: Urinoma  History of Present Illness: Marie Thomas is a 69 y.o. female with polio who underwent percutaneous guided cryoablation of a right renal lesion on 02/07/2014. Biopsy revealed an angiomyolipoma. Patient has had persistent pain upper abdominal pain which has been worsening recently. She recently developed a fever. Evaluation revealed a perinephric fluid collection on the right consistent with urinoma. This was drained earlier today by interventional radiology. Urologic consultation is requested to assist with management of this process.  She is followed by Dr. Stasia Cavalier. She does have polio and lower extremity weakness. She is wheelchair-bound.  Past Medical History  Diagnosis Date  . Polio   . 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 H/O steroid therapy     Steroid use -last used 1 week ago  . Pain at rest     abdominal pain, right side- intermittent"dx. right renal mass"  . Headache     migraines-have occurred less since retired '99.  . Neuromuscular disorder     polio- no mobility lower extremities bilaterally  . Hypertension     Past Surgical History  Procedure Laterality Date  . Abdominal hysterectomy    . Leg tendon surgery      age 59 or 69 yrs old     Current Hospital Medications: Scheduled Meds: . [START ON 04/24/2014] cholecalciferol  2,000 Units Oral Daily  . docusate sodium  100 mg Oral Q12H  . enoxaparin (LOVENOX) injection  40 mg Subcutaneous Q24H  . lidocaine      . sodium chloride  3 mL Intravenous Q12H  . vancomycin  1,000 mg Intravenous STAT   Continuous Infusions: . sodium chloride     PRN Meds:.acetaminophen **OR** acetaminophen, albuterol, HYDROcodone-acetaminophen, HYDROmorphone (DILAUDID) injection, ondansetron **OR** ondansetron (ZOFRAN) IV  Allergies: No Known  Allergies  Family History  Problem Relation Age of Onset  . Heart disease Brother   . Prostate cancer Brother     Social History:  reports that she has never smoked. She does not have any smokeless tobacco history on file. She reports that she drinks alcohol. She reports that she does not use illicit drugs.  ROS: Right flank pain, nausea, decreased oral intake, fever  Physical Exam:  Vital signs in last 24 hours: Temp:  [98 F (36.7 C)-98.6 F (37 C)] 98.6 F (37 C) (04/06 1635) Pulse Rate:  [113-122] 121 (04/06 1635) Resp:  [13-20] 15 (04/06 1635) BP: (95-126)/(53-73) 126/73 mmHg (04/06 1635) SpO2:  [95 %-100 %] 98 % (04/06 1635) Weight:  [54.432 kg (120 lb)] 54.432 kg (120 lb) (04/06 1635) General:  Alert and oriented, No acute distress HEENT: Normocephalic, atraumatic Cardiovascular: Regular rate and rhythm Lungs: Normal inspiratory neck toward excursion Abdomen: Soft, minimal right upper quadrant tenderness. Drain in fluid is Lavone Neri he/brownish right flank/os tear lumbar area.    Laboratory Data:   Recent Labs  04/23/14 0901 04/23/14 1344  WBC  --  17.4*  HGB 13.9 11.8*  HCT 41.0 37.1  PLT  --  273     Recent Labs  04/23/14 0901 04/23/14 1344  NA 135 137  K 3.5 3.6  CL 96 97  GLUCOSE 112* 105*  BUN 25* 26*  CALCIUM  --  9.3  CREATININE 0.90 0.95     Results for orders placed or performed during the hospital encounter of 04/23/14 (from the  past 24 hour(s))  CBC     Status: Abnormal   Collection Time: 04/23/14  1:44 PM  Result Value Ref Range   WBC 17.4 (H) 4.0 - 10.5 K/uL   RBC 4.55 3.87 - 5.11 MIL/uL   Hemoglobin 11.8 (L) 12.0 - 15.0 g/dL   HCT 37.1 36.0 - 46.0 %   MCV 81.5 78.0 - 100.0 fL   MCH 25.9 (L) 26.0 - 34.0 pg   MCHC 31.8 30.0 - 36.0 g/dL   RDW 15.9 (H) 11.5 - 15.5 %   Platelets 273 150 - 400 K/uL  Protime-INR     Status: None   Collection Time: 04/23/14  1:44 PM  Result Value Ref Range   Prothrombin Time 14.7 11.6 - 15.2 seconds    INR 1.13 0.00 - 1.49  APTT     Status: None   Collection Time: 04/23/14  1:44 PM  Result Value Ref Range   aPTT 25 24 - 37 seconds  Comprehensive metabolic panel     Status: Abnormal   Collection Time: 04/23/14  1:44 PM  Result Value Ref Range   Sodium 137 135 - 145 mmol/L   Potassium 3.6 3.5 - 5.1 mmol/L   Chloride 97 96 - 112 mmol/L   CO2 27 19 - 32 mmol/L   Glucose, Bld 105 (H) 70 - 99 mg/dL   BUN 26 (H) 6 - 23 mg/dL   Creatinine, Ser 0.95 0.50 - 1.10 mg/dL   Calcium 9.3 8.4 - 10.5 mg/dL   Total Protein 7.2 6.0 - 8.3 g/dL   Albumin 3.2 (L) 3.5 - 5.2 g/dL   AST 23 0 - 37 U/L   ALT 22 0 - 35 U/L   Alkaline Phosphatase 109 39 - 117 U/L   Total Bilirubin 0.8 0.3 - 1.2 mg/dL   GFR calc non Af Amer 60 (L) >90 mL/min   GFR calc Af Amer 70 (L) >90 mL/min   Anion gap 13 5 - 15   No results found for this or any previous visit (from the past 240 hour(s)).  Renal Function:  Recent Labs  04/23/14 0901 04/23/14 1344  CREATININE 0.90 0.95   Estimated Creatinine Clearance: 44.8 mL/min (by C-G formula based on Cr of 0.95).  Radiologic Imaging: Ir Lenise Arena W Catheter Placement  04/23/2014   CLINICAL DATA:  69 year old female with history of a right renal lesion cryoablation on 02/07/2014. The patient presents with right flank pain, fevers and tachycardia. Recent CT demonstrated a right perinephric fluid collection which is concerning for a urinoma from a leaking calyx.  EXAM: ULTRASOUND AND FLUOROSCOPIC GUIDED PLACEMENT OF DRAINAGE CATHETER WITHIN THE RIGHT PERINEPHRIC FLUID COLLECTION.  Physician: Stephan Minister. Henn, MD  FLUOROSCOPY TIME:  18 seconds, 6 mGy  MEDICATIONS AND MEDICAL HISTORY: Zosyn 3.375 g. Versed 0.5 mg, fentanyl 25 mcg. A radiology nurse monitored the patient for moderate sedation.  ANESTHESIA/SEDATION: Moderate sedation time: 20 minutes  CONTRAST:  None  PROCEDURE: The procedure was explained to the patient. The risks and benefits of the procedure were discussed and the  patient's questions were addressed. Informed consent was obtained from the patient. The patient was placed prone on the interventional table. Ultrasound was used to evaluate the right flank. The right flank was prepped and draped in sterile fashion. Maximal barrier sterile technique was utilized including caps, mask, sterile gowns, sterile gloves, sterile drape, hand hygiene and skin antiseptic.  Skin and soft tissues were anesthetized with 1% lidocaine. An 18 gauge trocar needle was  directed into the fluid collection with ultrasound guidance. Cloudy yellow fluid was immediately aspirated. A stiff Amplatz wire was placed. The tract was dilated to accommodate a 12 [place] multipurpose drain. The drain was advanced into the collection with fluoroscopic guidance. Greater than 300 mL of brown cloudy fluid was removed. The collection was completely decompressed based on ultrasound. Catheter was attached to a suction bulb and sutured to the skin.  FINDINGS: There is a large complex fluid collection in the right perinephric region. Needle and drain were successful placed within the collection. Brown purulent-looking fluid was removed. The collection was decompressed following drain placement.  Fluoroscopic images initially demonstrated residual intravenous contrast within the right kidney with mild to moderate hydronephrosis. In addition, there was contrast within the perinephric collection consistent with the known calyceal leak. Following placement of the drain, there was not only resolution of the perinephric collection but decompression of the right renal collecting system.  Estimated blood loss: Minimal  COMPLICATIONS: None  IMPRESSION: Successful drainage of the right perinephric urinoma. There is concern that the urinoma could be infected and a fluid sample was sent for culture.  Decompression of the perinephric urinoma and right renal collecting system following placement of the urinoma drain.   Electronically  Signed   By: Markus Daft M.D.   On: 04/23/2014 16:46   Ct Abd Wo & W Cm  04/23/2014   CLINICAL DATA:  Persistent flank pain. History of renal cryoablation with postprocedural hematoma.  EXAM: CT ABDOMEN WITHOUT AND WITH CONTRAST  TECHNIQUE: Multidetector CT imaging of the abdomen was performed following the standard protocol before and following the bolus administration of intravenous contrast.  CONTRAST:  180mL OMNIPAQUE IOHEXOL 300 MG/ML  SOLN  COMPARISON:  CT scan 02/20/2014  FINDINGS: Lower chest: The lung bases demonstrate bibasilar atelectasis. No focal pulmonary lesion or pleural effusion. The heart is normal in size. No pericardial effusion. The distal esophagus is grossly normal.  Hepatobiliary: Stable mild intrahepatic biliary dilatation. Stable left hepatic lobe cyst. The complex calcified lesion in the right hepatic lobe in segment 5 is unchanged. Continued observation is suggested. No common bile duct dilatation.  Pancreas: Normal  Spleen: Normal  Adrenals/Urinary Tract: The left adrenal gland and left kidney are normal except for stable parapelvic and parenchymal renal cysts.  The right kidney also demonstrates stable parapelvic and parenchymal renal cysts. I do not see any persistent enhancing renal mass but there is an enlarging perinephric fluid collection which is displacing the kidney anteriorly and inferiorly. It previously measured 5.0 x 3.9 cm and now measures 8.7 x 8.6 cm. There is irregular enhancement around the fluid collection. The delayed renal images demonstrate leaking contrast into this fluid collection consistent with a enlarging urinoma.  Stomach/Bowel: The stomach, duodenum, visualized small bowel and visualized colon are grossly normal.  Vascular/Lymphatic: No mesenteric or retroperitoneal mass or adenopathy. Small scattered lymph nodes are stable. The aorta and branch vessels are patent. The major venous structures are patent.  Musculoskeletal: No significant bony findings.   IMPRESSION: 1. Enlarging right perinephric fluid collection consistent with a urinoma with active leaking from a superior posterior calyx demonstrated on the delayed images. 2. Stable complex calcified right hepatic lesion. Recommend continued observation. Findings called to Dr. Annamaria Boots who will be seeing the patient in the IR Clinic today.   Electronically Signed   By: Marijo Sanes M.D.   On: 04/23/2014 10:40    I independently reviewed the above imaging studies.  Impression/Assessment:  Right renal mass,  status post percutaneous biopsy and cryoablation on 02/07/2014. It looks like she has a urinoma which was recently drained by Dr. Anselm Pancoast in interventional radiology. The patient feels much better now. Approximate 300 mL of fluid was obtained, another 100 mL is now in the drain.  Plan:  At the present time, I think it worthwhile just to let the percutaneous drain function to drain her perinephric fluid collection. I do agree with Dr. Anselm Pancoast that double-J stent placement may help manage the probable renal leak secondary to the cryoablation. I would suggest we wait a few days, and perhaps early in the week for cystoscopy and stent placement. I've discussed this with the patient. I will follow her during her hospitalization and we will get that procedure scheduled appropriately.

## 2014-04-23 NOTE — Progress Notes (Signed)
ANTIBIOTIC CONSULT NOTE - INITIAL  Pharmacy Consult for Vancomycin, Zosyn Indication: Urinoma Vs Perinephric abscess  No Known Allergies  Patient Measurements: Height: 5\' 2"  (157.5 cm) Weight: 120 lb (54.432 kg) IBW/kg (Calculated) : 50.1  Vital Signs: Temp: 98.6 F (37 C) (04/06 1635) Temp Source: Oral (04/06 1635) BP: 126/73 mmHg (04/06 1635) Pulse Rate: 121 (04/06 1635) Intake/Output from previous day:    Labs:  Recent Labs  04/23/14 0901 04/23/14 1344  WBC  --  17.4*  HGB 13.9 11.8*  PLT  --  273  CREATININE 0.90 0.95   Estimated Creatinine Clearance: 44.8 mL/min (by C-G formula based on Cr of 0.95). No results for input(s): VANCOTROUGH, VANCOPEAK, VANCORANDOM, GENTTROUGH, GENTPEAK, GENTRANDOM, TOBRATROUGH, TOBRAPEAK, TOBRARND, AMIKACINPEAK, AMIKACINTROU, AMIKACIN in the last 72 hours.   Microbiology: No results found for this or any previous visit (from the past 720 hour(s)).  Medical History: Past Medical History  Diagnosis Date  . Polio   . 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 H/O steroid therapy     Steroid use -last used 1 week ago  . Pain at rest     abdominal pain, right side- intermittent"dx. right renal mass"  . Headache     migraines-have occurred less since retired '99.  . Neuromuscular disorder     polio- no mobility lower extremities bilaterally  . Hypertension     Medications:  Anti-infectives    Start     Dose/Rate Route Frequency Ordered Stop   04/23/14 1315  piperacillin-tazobactam (ZOSYN) IVPB 3.375 g  Status:  Discontinued     3.375 g 12.5 mL/hr over 240 Minutes Intravenous 3 times per day 04/23/14 1249 04/23/14 1634     Assessment: 42 yoF admitted on 4/6 with worsening flank pain.  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  CT today with  enlarging perinephric fluid collection, active urine leak.  Concern for Urinoma Vs Perinephric abscess, s/p drain placement (4/6) with culture.  Pharmacy is consulted to dose vancomycin and Zosyn.  4/6 >> Vanc >> 4/6 >> Zosyn >>    4/6 Abscess culture: sent  Today, 04/23/2014:  Tmax: afebrile  WBC: 17.4  Renal: SCr 0.95, CrCl ~ 45 ml/min    Goal of Therapy:  Vancomycin trough level 15-20 mcg/ml  Plan:   Zosyn 3.375g IV Q8H infused over 4hrs.  Vancomycin 1g IV once, then 500 mg IV q12h.  Measure Vanc trough at steady state.  Follow up renal fxn, culture results, and clinical course.   Gretta Arab PharmD, BCPS Pager [phone] 04/23/2014 5:31 PM

## 2014-04-24 ENCOUNTER — Other Ambulatory Visit: Payer: Self-pay | Admitting: Urology

## 2014-04-24 DIAGNOSIS — E876 Hypokalemia: Secondary | ICD-10-CM | POA: Insufficient documentation

## 2014-04-24 DIAGNOSIS — D649 Anemia, unspecified: Secondary | ICD-10-CM | POA: Insufficient documentation

## 2014-04-24 LAB — BASIC METABOLIC PANEL
ANION GAP: 9 (ref 5–15)
BUN: 19 mg/dL (ref 6–23)
CHLORIDE: 101 mmol/L (ref 96–112)
CO2: 27 mmol/L (ref 19–32)
Calcium: 8.2 mg/dL — ABNORMAL LOW (ref 8.4–10.5)
Creatinine, Ser: 0.58 mg/dL (ref 0.50–1.10)
GFR calc non Af Amer: >90 mL/min (ref >90)
Glucose, Bld: 119 mg/dL — ABNORMAL HIGH (ref 70–99)
Potassium: 2.8 mmol/L — ABNORMAL LOW (ref 3.5–5.1)
SODIUM: 137 mmol/L (ref 135–145)

## 2014-04-24 LAB — CBC
HCT: 31.4 % — ABNORMAL LOW (ref 36.0–46.0)
Hemoglobin: 9.9 g/dL — ABNORMAL LOW (ref 12.0–15.0)
MCH: 26 pg (ref 26.0–34.0)
MCHC: 31.5 g/dL (ref 30.0–36.0)
MCV: 82.4 fL (ref 78.0–100.0)
PLATELETS: 233 10*3/uL (ref 150–400)
RBC: 3.81 MIL/uL — ABNORMAL LOW (ref 3.87–5.11)
RDW: 16.1 % — AB (ref 11.5–15.5)
WBC: 11.5 10*3/uL — ABNORMAL HIGH (ref 4.0–10.5)

## 2014-04-24 LAB — MAGNESIUM: Magnesium: 1.9 mg/dL (ref 1.5–2.5)

## 2014-04-24 MED ORDER — POTASSIUM CHLORIDE CRYS ER 20 MEQ PO TBCR
40.0000 meq | EXTENDED_RELEASE_TABLET | ORAL | Status: AC
  Administered 2014-04-24 (×2): 40 meq via ORAL
  Filled 2014-04-24 (×2): qty 2

## 2014-04-24 MED ORDER — VANCOMYCIN HCL 500 MG IV SOLR: 500.0000 mg | Freq: Once | INTRAVENOUS | Status: DC

## 2014-04-24 MED ORDER — VANCOMYCIN HCL 500 MG IV SOLR
500.0000 mg | Freq: Two times a day (BID) | INTRAVENOUS | Status: DC
  Administered 2014-04-24 – 2014-04-25 (×2): 500 mg via INTRAVENOUS
  Filled 2014-04-24 (×3): qty 500

## 2014-04-24 MED ORDER — SODIUM CHLORIDE 0.9 % IV SOLN
INTRAVENOUS | Status: AC
  Administered 2014-04-24 (×2): via INTRAVENOUS

## 2014-04-24 MED ORDER — VANCOMYCIN HCL 500 MG IV SOLR
500.0000 mg | Freq: Once | INTRAVENOUS | Status: AC
  Administered 2014-04-24: 500 mg via INTRAVENOUS
  Filled 2014-04-24: qty 500

## 2014-04-24 MED ORDER — ENSURE ENLIVE PO LIQD
237.0000 mL | Freq: Two times a day (BID) | ORAL | Status: DC
Start: 2014-04-24 — End: 2014-04-25
  Administered 2014-04-24 – 2014-04-25 (×3): 237 mL via ORAL

## 2014-04-24 NOTE — Progress Notes (Signed)
Spoke with pt concerning discharge needs, support and home health.  Pt states, "I have my family to help me. I want to hold off on home health right now. My procedure is Monday and I want to go home."  Pt is very pleasant, a private duty list was given to pt.

## 2014-04-24 NOTE — Progress Notes (Signed)
Clinical Social Work Department BRIEF PSYCHOSOCIAL ASSESSMENT 04/24/2014  Patient:  Marie Thomas, Marie Thomas     Account Number:  [id number]     [place] date:  04/23/2014  Clinical Social Worker:  Glorious Peach, [place]  Date/Time:  04/24/2014 02:07 PM  Referred by:  Physician  Date Referred:  04/24/2014 Referred for  Transportation assistance   Other Referral:   Interview type:  Patient Other interview type:    PSYCHOSOCIAL DATA Living Status:  ALONE Admitted from facility:   Level of care:   Primary support name:  Sunday Corn Primary support relationship to patient:  SIBLING Degree of support available:   Adequate    CURRENT CONCERNS Current Concerns  Other - See comment   Other Concerns:   Transportation assistance    SOCIAL WORK ASSESSMENT / PLAN CSW received consult stating that pt needed assistance with transportation.   Assessment/plan status:  Information/Referral to [employer] Other assessment/ plan:   Information/referral to community resources:   CSW put together a SCAT packet for pt.    PATIENT'S/FAMILY'S RESPONSE TO PLAN OF CARE: CSW met with pt at bedside, introduced self and explained role. CSW was notified of pt needing transportation assistance to get around when at home. Pt states that she has had help from her sister and brother but they have health issues and their own obligations so she wants to do things independently. Pt states that she use to drive but when she became ill, she had to stop and now needs transportation for doctor's appointment, etc. CSW gave pt a SCAT application, and went over form with pt so that she knows exactly what needs to be done in order to get their services. Pt also requests a private caregiver that could come in her home once every two weeks to help around the house. CSW made RN Case Manager Cookie aware so that she could provide pt with a list of services of caregivers. CSW is signing off but if needed can  be reached at [phone].       Glorious Peach BSW Intern

## 2014-04-24 NOTE — Progress Notes (Signed)
1 Day Post-Op Subjective: Patient reports that she feels much better and no significant abdominal pain. Still a fair amount of drainage out her Keenan Bachelor drain.  Objective: Vital signs in last 24 hours: Temp:  [97.8 F (36.6 C)-99 F (37.2 C)] 97.9 F (36.6 C) (04/07 0416) Pulse Rate:  [106-122] 106 (04/07 0416) Resp:  [13-20] 18 (04/07 0416) BP: (95-126)/(53-73) 106/57 mmHg (04/07 0416) SpO2:  [95 %-100 %] 95 % (04/07 0416) Weight:  [54.432 kg (120 lb)] 54.432 kg (120 lb) (04/06 1635)  Intake/Output from previous day: 04/06 0701 - 04/07 0700 In: 170.4 [I.V.:160.4] Out: 700 [Urine:400; Drains:300] Intake/Output this shift: Total I/O In: 20 [Other:20] Out: -   Physical Exam:  Constitutional: Vital signs reviewed. WD WN in NAD   Eyes: PERRL, No scleral icterus.   Pulmonary/Chest: Normal effort  Lab Results:  Recent Labs  04/23/14 0901 04/23/14 1344 04/24/14 0442  HGB 13.9 11.8* 9.9*  HCT 41.0 37.1 31.4*   BMET  Recent Labs  04/23/14 1344 04/24/14 0442  NA 137 137  K 3.6 2.8*  CL 97 101  CO2 27 27  GLUCOSE 105* 119*  BUN 26* 19  CREATININE 0.95 0.58  CALCIUM 9.3 8.2*    Recent Labs  04/23/14 1344  INR 1.13   No results for input(s): LABURIN in the last 72 hours. Results for orders placed or performed during the hospital encounter of 04/23/14  Culture, routine-abscess     Status: None (Preliminary result)   Collection Time: 04/23/14  4:15 PM  Result Value Ref Range Status   Specimen Description ABSCESS  Final   Special Requests NONE  Final   Gram Stain   Final    ABUNDANT WBC PRESENT,BOTH PMN AND MONONUCLEAR NO SQUAMOUS EPITHELIAL CELLS SEEN NO ORGANISMS SEEN Performed at [facility]    Culture PENDING  Incomplete   Report Status PENDING  Incomplete    Studies/Results: Ir Lenise Arena W Catheter Placement  04/23/2014   CLINICAL DATA:  69 year old female with history of a right renal lesion cryoablation on 02/07/2014. The patient presents  with right flank pain, fevers and tachycardia. Recent CT demonstrated a right perinephric fluid collection which is concerning for a urinoma from a leaking calyx.  EXAM: ULTRASOUND AND FLUOROSCOPIC GUIDED PLACEMENT OF DRAINAGE CATHETER WITHIN THE RIGHT PERINEPHRIC FLUID COLLECTION.  Physician: Stephan Minister. Henn, MD  FLUOROSCOPY TIME:  18 seconds, 6 mGy  MEDICATIONS AND MEDICAL HISTORY: Zosyn 3.375 g. Versed 0.5 mg, fentanyl 25 mcg. A radiology nurse monitored the patient for moderate sedation.  ANESTHESIA/SEDATION: Moderate sedation time: 20 minutes  CONTRAST:  None  PROCEDURE: The procedure was explained to the patient. The risks and benefits of the procedure were discussed and the patient's questions were addressed. Informed consent was obtained from the patient. The patient was placed prone on the interventional table. Ultrasound was used to evaluate the right flank. The right flank was prepped and draped in sterile fashion. Maximal barrier sterile technique was utilized including caps, mask, sterile gowns, sterile gloves, sterile drape, hand hygiene and skin antiseptic.  Skin and soft tissues were anesthetized with 1% lidocaine. An 18 gauge trocar needle was directed into the fluid collection with ultrasound guidance. Cloudy yellow fluid was immediately aspirated. A stiff Amplatz wire was placed. The tract was dilated to accommodate a 12 [place] multipurpose drain. The drain was advanced into the collection with fluoroscopic guidance. Greater than 300 mL of brown cloudy fluid was removed. The collection was completely decompressed based on ultrasound. Catheter  was attached to a suction bulb and sutured to the skin.  FINDINGS: There is a large complex fluid collection in the right perinephric region. Needle and drain were successful placed within the collection. Brown purulent-looking fluid was removed. The collection was decompressed following drain placement.  Fluoroscopic images initially demonstrated residual  intravenous contrast within the right kidney with mild to moderate hydronephrosis. In addition, there was contrast within the perinephric collection consistent with the known calyceal leak. Following placement of the drain, there was not only resolution of the perinephric collection but decompression of the right renal collecting system.  Estimated blood loss: Minimal  COMPLICATIONS: None  IMPRESSION: Successful drainage of the right perinephric urinoma. There is concern that the urinoma could be infected and a fluid sample was sent for culture.  Decompression of the perinephric urinoma and right renal collecting system following placement of the urinoma drain.   Electronically Signed   By: Markus Daft M.D.   On: 04/23/2014 16:46   Ct Abd Wo & W Cm  04/23/2014   CLINICAL DATA:  Persistent flank pain. History of renal cryoablation with postprocedural hematoma.  EXAM: CT ABDOMEN WITHOUT AND WITH CONTRAST  TECHNIQUE: Multidetector CT imaging of the abdomen was performed following the standard protocol before and following the bolus administration of intravenous contrast.  CONTRAST:  129mL OMNIPAQUE IOHEXOL 300 MG/ML  SOLN  COMPARISON:  CT scan 02/20/2014  FINDINGS: Lower chest: The lung bases demonstrate bibasilar atelectasis. No focal pulmonary lesion or pleural effusion. The heart is normal in size. No pericardial effusion. The distal esophagus is grossly normal.  Hepatobiliary: Stable mild intrahepatic biliary dilatation. Stable left hepatic lobe cyst. The complex calcified lesion in the right hepatic lobe in segment 5 is unchanged. Continued observation is suggested. No common bile duct dilatation.  Pancreas: Normal  Spleen: Normal  Adrenals/Urinary Tract: The left adrenal gland and left kidney are normal except for stable parapelvic and parenchymal renal cysts.  The right kidney also demonstrates stable parapelvic and parenchymal renal cysts. I do not see any persistent enhancing renal mass but there is an  enlarging perinephric fluid collection which is displacing the kidney anteriorly and inferiorly. It previously measured 5.0 x 3.9 cm and now measures 8.7 x 8.6 cm. There is irregular enhancement around the fluid collection. The delayed renal images demonstrate leaking contrast into this fluid collection consistent with a enlarging urinoma.  Stomach/Bowel: The stomach, duodenum, visualized small bowel and visualized colon are grossly normal.  Vascular/Lymphatic: No mesenteric or retroperitoneal mass or adenopathy. Small scattered lymph nodes are stable. The aorta and branch vessels are patent. The major venous structures are patent.  Musculoskeletal: No significant bony findings.  IMPRESSION: 1. Enlarging right perinephric fluid collection consistent with a urinoma with active leaking from a superior posterior calyx demonstrated on the delayed images. 2. Stable complex calcified right hepatic lesion. Recommend continued observation. Findings called to Dr. Annamaria Boots who will be seeing the patient in the IR Clinic today.   Electronically Signed   By: Marijo Sanes M.D.   On: 04/23/2014 10:40    Assessment/Plan:   Status post drainage of large perinephric urinoma on the right. She seems to be doing better. I'm fine with letting her go home with the drain. We will arrange for cystoscopy and stent placement early in the week.   LOS: 1 day   Jorja Loa 04/24/2014, 10:37 AM

## 2014-04-24 NOTE — Progress Notes (Signed)
PROGRESS NOTE    Marie Thomas IWL:[id number] DOB: Jun 06, 1945 DOA: 04/23/2014 PCP: Mathews Argyle, MD  HPI/Brief narrative 69 y.o. female with history of poliomyelitis, paraplegia, lives alone, moves around with help of a wheelchair, able to stand up and transfer to wheelchair, independent with home activities, status post right renal mass cryoablation and biopsy 02/07/14, biopsy demonstrated angiomyolipoma, presented to IR office on 4/6 for follow-up regarding worsening right-sided flank pain, fevers and chills. CT abdomen showed enlarging right perinephritic fluid collection with a urine leak, compatible with enlarging urinoma and possibly abscess. Interventional radiology placed a drain in the collection and obtain 300 mL of brown purulent looking fluid. Urology was consulted by IR and hospitalist service was requested to admit for further management. Patient was mildly tachycardic, soft blood pressures in the 90s and had WBC of 17.4.   Assessment/Plan:  Principal Problem:  Urinoma/possible perinephric Abscess - Status post ultrasound guided drainage of right perinephric urinoma/abscess on 04/23/14 - Fluid culture: Pending. Follow final results. - Continue empirically started IV vancomycin and Zosyn. - Clinically improved. - As per interventional radiology, continued irrigation of drain while in-house, drain should remain in place until patient sees Dr. Laurence Ferrari in [place] clinic in 2 weeks and drain flushes will not be necessary at discharge.  Active Problems:  Sepsis - Hydrated with IV fluids and treated empirically with IV vancomycin and Zosyn. - Follow outstanding culture results. 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 - Improved - Follow CBCs   Anemia - Likely chronic and stable. - Approximately 2 g hemoglobin drop since admission:  Possibly dilutional. No reported bleeding. - Follow CBC   History of poliomyelitis & Paraplegia - Patient may need SNF if plan is to discharge with drain for extended period of time.  Stable complex calcified right hepatic lesion - Seen on CT. Continued observation per recommendations.   Hypokalemia - Replace and follow BMP   Code Status: Full Family Communication: Discussed with patient's sister at bedside on 4/7 Disposition Plan: Possibly to SNF pending final fluid culture results and medical stability.   Consultants:  Interventional radiology  Urology  Procedures:  Ultrasound-guided drainage of right perinephric urinoma/abscess and drain placement on 04/24/14.  Antibiotics:  IV Vancomycin 4/6 >  IV Zosyn 4/6 >   Subjective: Feels much better. R flank pain improved 5/10 and intermittent- worse with movement.  Objective: Filed Vitals:   04/23/14 1823 04/23/14 1858 04/23/14 2021 04/24/14 0416  BP: 116/61 109/55 111/59 106/57  Pulse: 121 117 116 106  Temp: 98.1 F (36.7 C) 99 F (37.2 C) 98 F (36.7 C) 97.9 F (36.6 C)  TempSrc: Oral Oral Oral Oral  Resp: 20 20 18 18   Height:      Weight:      SpO2: 99% 98% 95% 95%    Intake/Output Summary (Last 24 hours) at 04/24/14 1223 Last data filed at 04/24/14 0900  Gross per 24 hour  Intake 190.42 ml  Output    700 ml  Net -509.58 ml   Filed Weights   04/23/14 1635  Weight: 54.432 kg (120 lb)     Exam:  General exam: Moderately built and nourished pleasant middle-aged female patient, lying comfortably supine in bed in no obvious distress. Respiratory system: Clear. No increased work of breathing. Cardiovascular system: S1 & S2 heard, RRR. No JVD, murmurs, gallops, clicks or pedal edema. Telemetry:  Sinus rhythm in the 90s-ST in the 100s. Gastrointestinal system: Abdomen is nondistended, soft and nontender. Normal bowel sounds heard. Right flank JP drain intact draining straw-colored fluid in bulb. Central  nervous system: Alert and oriented. No focal neurological deficits. Extremities: Symmetric 5 x 5 power in upper extremities. Chronic paraplegia/grade 0 x 5 power in lower extremities.   Data Reviewed: Basic Metabolic Panel:  Recent Labs Lab 04/23/14 0901 04/23/14 1344 04/24/14 0442  NA 135 137 137  K 3.5 3.6 2.8*  CL 96 97 101  CO2  --  27 27  GLUCOSE 112* 105* 119*  BUN 25* 26* 19  CREATININE 0.90 0.95 0.58  CALCIUM  --  9.3 8.2*  MG  --   --  1.9   Liver Function Tests:  Recent Labs Lab 04/23/14 1344  AST 23  ALT 22  ALKPHOS 109  BILITOT 0.8  PROT 7.2  ALBUMIN 3.2*   No results for input(s): LIPASE, AMYLASE in the last 168 hours. No results for input(s): AMMONIA in the last 168 hours. CBC:  Recent Labs Lab 04/23/14 0901 04/23/14 1344 04/24/14 0442  WBC  --  17.4* 11.5*  HGB 13.9 11.8* 9.9*  HCT 41.0 37.1 31.4*  MCV  --  81.5 82.4  PLT  --  273 233   Cardiac Enzymes: No results for input(s): CKTOTAL, CKMB, CKMBINDEX, TROPONINI in the last 168 hours. BNP (last 3 results) No results for input(s): PROBNP in the last 8760 hours. CBG: No results for input(s): GLUCAP in the last 168 hours.  Recent Results (from the past 240 hour(s))  Culture, routine-abscess     Status: None (Preliminary result)   Collection Time: 04/23/14  4:15 PM  Result Value Ref Range Status   Specimen Description ABSCESS  Final   Special Requests NONE  Final   Gram Stain   Final    ABUNDANT WBC PRESENT,BOTH PMN AND MONONUCLEAR NO SQUAMOUS EPITHELIAL CELLS SEEN NO ORGANISMS SEEN Performed at [facility]    Culture PENDING  Incomplete   Report Status PENDING  Incomplete           Studies: Ir Lenise Arena W Catheter Placement  04/23/2014   CLINICAL DATA:  69 year old female with history of a right renal lesion cryoablation on 02/07/2014. The patient presents with right flank pain, fevers and tachycardia. Recent CT demonstrated a right perinephric fluid collection  which is concerning for a urinoma from a leaking calyx.  EXAM: ULTRASOUND AND FLUOROSCOPIC GUIDED PLACEMENT OF DRAINAGE CATHETER WITHIN THE RIGHT PERINEPHRIC FLUID COLLECTION.  Physician: Stephan Minister. Henn, MD  FLUOROSCOPY TIME:  18 seconds, 6 mGy  MEDICATIONS AND MEDICAL HISTORY: Zosyn 3.375 g. Versed 0.5 mg, fentanyl 25 mcg. A radiology nurse monitored the patient for moderate sedation.  ANESTHESIA/SEDATION: Moderate sedation time: 20 minutes  CONTRAST:  None  PROCEDURE: The procedure was explained to the patient. The risks and benefits of the procedure were discussed and the patient's questions were addressed. Informed consent was obtained from the patient. The patient was placed prone on the interventional table. Ultrasound was used to evaluate the right flank. The right flank was prepped and draped in sterile fashion. Maximal barrier sterile technique was utilized including caps, mask, sterile gowns, sterile gloves, sterile drape, hand hygiene and skin antiseptic.  Skin and soft tissues were anesthetized with 1% lidocaine. An 18 gauge trocar needle was directed into the fluid collection with ultrasound guidance. Cloudy yellow fluid was immediately aspirated. A stiff Amplatz wire was placed. The tract  was dilated to accommodate a 12 [place] multipurpose drain. The drain was advanced into the collection with fluoroscopic guidance. Greater than 300 mL of brown cloudy fluid was removed. The collection was completely decompressed based on ultrasound. Catheter was attached to a suction bulb and sutured to the skin.  FINDINGS: There is a large complex fluid collection in the right perinephric region. Needle and drain were successful placed within the collection. Brown purulent-looking fluid was removed. The collection was decompressed following drain placement.  Fluoroscopic images initially demonstrated residual intravenous contrast within the right kidney with mild to moderate hydronephrosis. In addition, there was  contrast within the perinephric collection consistent with the known calyceal leak. Following placement of the drain, there was not only resolution of the perinephric collection but decompression of the right renal collecting system.  Estimated blood loss: Minimal  COMPLICATIONS: None  IMPRESSION: Successful drainage of the right perinephric urinoma. There is concern that the urinoma could be infected and a fluid sample was sent for culture.  Decompression of the perinephric urinoma and right renal collecting system following placement of the urinoma drain.   Electronically Signed   By: Markus Daft M.D.   On: 04/23/2014 16:46   Ct Abd Wo & W Cm  04/23/2014   CLINICAL DATA:  Persistent flank pain. History of renal cryoablation with postprocedural hematoma.  EXAM: CT ABDOMEN WITHOUT AND WITH CONTRAST  TECHNIQUE: Multidetector CT imaging of the abdomen was performed following the standard protocol before and following the bolus administration of intravenous contrast.  CONTRAST:  180mL OMNIPAQUE IOHEXOL 300 MG/ML  SOLN  COMPARISON:  CT scan 02/20/2014  FINDINGS: Lower chest: The lung bases demonstrate bibasilar atelectasis. No focal pulmonary lesion or pleural effusion. The heart is normal in size. No pericardial effusion. The distal esophagus is grossly normal.  Hepatobiliary: Stable mild intrahepatic biliary dilatation. Stable left hepatic lobe cyst. The complex calcified lesion in the right hepatic lobe in segment 5 is unchanged. Continued observation is suggested. No common bile duct dilatation.  Pancreas: Normal  Spleen: Normal  Adrenals/Urinary Tract: The left adrenal gland and left kidney are normal except for stable parapelvic and parenchymal renal cysts.  The right kidney also demonstrates stable parapelvic and parenchymal renal cysts. I do not see any persistent enhancing renal mass but there is an enlarging perinephric fluid collection which is displacing the kidney anteriorly and inferiorly. It previously  measured 5.0 x 3.9 cm and now measures 8.7 x 8.6 cm. There is irregular enhancement around the fluid collection. The delayed renal images demonstrate leaking contrast into this fluid collection consistent with a enlarging urinoma.  Stomach/Bowel: The stomach, duodenum, visualized small bowel and visualized colon are grossly normal.  Vascular/Lymphatic: No mesenteric or retroperitoneal mass or adenopathy. Small scattered lymph nodes are stable. The aorta and branch vessels are patent. The major venous structures are patent.  Musculoskeletal: No significant bony findings.  IMPRESSION: 1. Enlarging right perinephric fluid collection consistent with a urinoma with active leaking from a superior posterior calyx demonstrated on the delayed images. 2. Stable complex calcified right hepatic lesion. Recommend continued observation. Findings called to Dr. Annamaria Boots who will be seeing the patient in the IR Clinic today.   Electronically Signed   By: Marijo Sanes M.D.   On: 04/23/2014 10:40        Scheduled Meds: . cholecalciferol  2,000 Units Oral Daily  . docusate sodium  100 mg Oral Q12H  . enoxaparin (LOVENOX) injection  40 mg Subcutaneous Q24H  . feeding  supplement (ENSURE ENLIVE)  237 mL Oral BID BM  . piperacillin-tazobactam (ZOSYN)  IV  3.375 g Intravenous 3 times per day  . potassium chloride  40 mEq Oral Q4H  . sodium chloride  3 mL Intravenous Q12H  . vancomycin  500 mg Intravenous Q12H   Continuous Infusions:   Principal Problem:   Urinoma/possible perinephric Abscess Active Problems:   Sepsis   Dehydration   Renal angiomyolipoma   Leukocytosis   Anemia   History of poliomyelitis   Paraplegia    Time spent: 1 minutes    Taiesha Bovard, MD, FACP, FHM. [facility] Pager [phone]  If 7PM-7AM, please contact night-coverage [email or web address] Password [email or web address] 04/24/2014, 12:23 PM    LOS: 1 day

## 2014-04-24 NOTE — Progress Notes (Addendum)
INITIAL NUTRITION ASSESSMENT  DOCUMENTATION CODES Per approved criteria  -Non-severe (moderate) malnutrition in the context of chronic illness   INTERVENTION: Provide Ensure Enlive po BID, each supplement provides 350 kcal and 20 grams of protein Encourage PO intake at meal times  NUTRITION DIAGNOSIS: Malnutrition related to chronic illness as evidenced by less than 75% needed intakes for several months, mild/moderate fat depletion to triceps.  Goal: Pt to meet >/= 90% estimated needs  Monitor:  Meal and supplement intakes, weight trends, labs  Reason for Assessment: MST  70 y.o. female  Admitting Dx: Marie Thomas  ASSESSMENT: 69 y.o. female with R renal mass cryoablation and biopsy 02/07/14 which was found to be nonmalignant. Pt has hx of paraplegia and Polio infection.  Pt seen for MST and BMI is WNL. She was on CLD yesterday with diet now advanced; no intakes documented since admission. Pt reports poor appetite PTA with typically 2 meals eaten/day. She indicates that poor appetite is ongoing and that it began around the time of renal surgery/biopsy. She is unsure of her UBW but feels that she has lost weight. Pt began drinking Ensure recently to help with poor intakes and is interested in receiving it here. She is noted to have +2 BLE edema. Per rounds this AM, pt to go to SNF on d/c.  Labs reviewed: K 2.8 mmol/L  Nutrition Focused Physical Exam:  Subcutaneous Fat:  Orbital Region: WDL Upper Arm Region: mild/moderate wasting Thoracic and Lumbar Region: WDL  Muscle:  Temple Region: WDL Clavicle Bone Region: WDL Clavicle and Acromion Bone Region: WDL Scapular Bone Region: WDL Dorsal Hand: WDL Patellar Region: not applicable Anterior Thigh Region: not applicable Posterior Calf Region: not applicable  Edema: mild/moderate to BLE   Height: Ht Readings from Last 1 Encounters:  04/23/14 5\' 2"  (1.575 m)    Weight: Wt Readings from Last 1 Encounters:  04/23/14 120 lb  (54.432 kg)    Ideal Body Weight: 99-104.5 lbs (45-47.5 kg)  % Ideal Body Weight: 115-121%  Wt Readings from Last 10 Encounters:  04/23/14 120 lb (54.432 kg)  04/23/14 120 lb (54.432 kg)  02/07/14 140 lb (63.504 kg)  11/27/13 136 lb (61.689 kg)  11/15/13 136 lb (61.689 kg)  11/13/13 120 lb (54.432 kg)    Usual Body Weight: pt unsure  % Usual Body Weight: unable to determine  BMI:  Body mass index is 21.94 kg/(m^2).  Estimated Nutritional Needs: Kcal: 1350-1650 Protein: 60-80 grams Fluid: 2L/day  Skin: WDL; +2 BLE edema  Diet Order: Diet regular Room service appropriate?: Yes; Fluid consistency:: Thin    Intake/Output Summary (Last 24 hours) at 04/24/14 1010 Last data filed at 04/24/14 0900  Gross per 24 hour  Intake 190.42 ml  Output    700 ml  Net -509.58 ml    Last BM: PTA  Labs:   Recent Labs Lab 04/23/14 0901 04/23/14 1344 04/24/14 0442  NA 135 137 137  K 3.5 3.6 2.8*  CL 96 97 101  CO2  --  27 27  BUN 25* 26* 19  CREATININE 0.90 0.95 0.58  CALCIUM  --  9.3 8.2*  MG  --   --  1.9  GLUCOSE 112* 105* 119*    CBG (last 3)  No results for input(s): GLUCAP in the last 72 hours.  Scheduled Meds: . cholecalciferol  2,000 Units Oral Daily  . docusate sodium  100 mg Oral Q12H  . enoxaparin (LOVENOX) injection  40 mg Subcutaneous Q24H  . piperacillin-tazobactam (ZOSYN)  IV  3.375 g Intravenous 3 times per day  . potassium chloride  40 mEq Oral Q4H  . sodium chloride  3 mL Intravenous Q12H  . vancomycin  500 mg Intravenous Q12H     Jarome Matin, RD, LDN Inpatient Clinical Dietitian Pager # [phone] After hours/weekend pager # [phone]

## 2014-04-24 NOTE — Progress Notes (Signed)
Patient ID: Marie Thomas, female   DOB: 03-Feb-1945, 69 y.o.   MRN: [id number]    Referring Physician(s): Ennever,P  Subjective: Patient feeling much better today since drainage procedure; denies significant abdominal or flank pain, nausea, vomiting.    Allergies: Review of patient's allergies indicates no known allergies.  Medications: Prior to Admission medications   Medication Sig Start Date End Date Taking? Authorizing Provider  acetaminophen (TYLENOL) 500 MG tablet Take 500 mg by mouth every 6 (six) hours as needed for mild pain.    Yes Historical Provider, MD  alendronate (FOSAMAX) 70 MG tablet Take 70 mg by mouth once a week. On Thursday 08/22/13  Yes Historical Provider, MD  Cholecalciferol (VITAMIN D) 2000 UNITS tablet Take 2,000 Units by mouth daily.   Yes Historical Provider, MD  docusate sodium (COLACE) 100 MG capsule Take 1 capsule (100 mg total) by mouth every 12 (twelve) hours. 11/13/13  Yes Sherwood Gambler, MD  HYDROcodone-acetaminophen (NORCO/VICODIN) 5-325 MG per tablet Take 1-2 tablets by mouth every 4 (four) hours as needed for moderate pain. 02/08/14  Yes Ascencion Dike, PA-C     Vital Signs: BP 106/57 mmHg  Pulse 106  Temp(Src) 97.9 F (36.6 C) (Oral)  Resp 18  Ht 5\' 2"  (1.575 m)  Wt 120 lb (54.432 kg)  BMI 21.94 kg/m2  SpO2 95%  Physical Exam patient awake, alert. Right perinephric drain intact, insertion site okay, minimal tenderness at site. Output today approximately 25 mL of turbid yellow fluid. Cultures pending.  Imaging: Ir Lenise Arena W Catheter Placement  04/23/2014   CLINICAL DATA:  69 year old female with history of a right renal lesion cryoablation on 02/07/2014. The patient presents with right flank pain, fevers and tachycardia. Recent CT demonstrated a right perinephric fluid collection which is concerning for a urinoma from a leaking calyx.  EXAM: ULTRASOUND AND FLUOROSCOPIC GUIDED PLACEMENT OF DRAINAGE CATHETER WITHIN THE RIGHT PERINEPHRIC  FLUID COLLECTION.  Physician: Stephan Minister. Henn, MD  FLUOROSCOPY TIME:  18 seconds, 6 mGy  MEDICATIONS AND MEDICAL HISTORY: Zosyn 3.375 g. Versed 0.5 mg, fentanyl 25 mcg. A radiology nurse monitored the patient for moderate sedation.  ANESTHESIA/SEDATION: Moderate sedation time: 20 minutes  CONTRAST:  None  PROCEDURE: The procedure was explained to the patient. The risks and benefits of the procedure were discussed and the patient's questions were addressed. Informed consent was obtained from the patient. The patient was placed prone on the interventional table. Ultrasound was used to evaluate the right flank. The right flank was prepped and draped in sterile fashion. Maximal barrier sterile technique was utilized including caps, mask, sterile gowns, sterile gloves, sterile drape, hand hygiene and skin antiseptic.  Skin and soft tissues were anesthetized with 1% lidocaine. An 18 gauge trocar needle was directed into the fluid collection with ultrasound guidance. Cloudy yellow fluid was immediately aspirated. A stiff Amplatz wire was placed. The tract was dilated to accommodate a 12 [place] multipurpose drain. The drain was advanced into the collection with fluoroscopic guidance. Greater than 300 mL of brown cloudy fluid was removed. The collection was completely decompressed based on ultrasound. Catheter was attached to a suction bulb and sutured to the skin.  FINDINGS: There is a large complex fluid collection in the right perinephric region. Needle and drain were successful placed within the collection. Brown purulent-looking fluid was removed. The collection was decompressed following drain placement.  Fluoroscopic images initially demonstrated residual intravenous contrast within the right kidney with mild to moderate hydronephrosis. In addition, there was contrast  within the perinephric collection consistent with the known calyceal leak. Following placement of the drain, there was not only resolution of the  perinephric collection but decompression of the right renal collecting system.  Estimated blood loss: Minimal  COMPLICATIONS: None  IMPRESSION: Successful drainage of the right perinephric urinoma. There is concern that the urinoma could be infected and a fluid sample was sent for culture.  Decompression of the perinephric urinoma and right renal collecting system following placement of the urinoma drain.   Electronically Signed   By: Markus Daft M.D.   On: 04/23/2014 16:46   Ct Abd Wo & W Cm  04/23/2014   CLINICAL DATA:  Persistent flank pain. History of renal cryoablation with postprocedural hematoma.  EXAM: CT ABDOMEN WITHOUT AND WITH CONTRAST  TECHNIQUE: Multidetector CT imaging of the abdomen was performed following the standard protocol before and following the bolus administration of intravenous contrast.  CONTRAST:  190mL OMNIPAQUE IOHEXOL 300 MG/ML  SOLN  COMPARISON:  CT scan 02/20/2014  FINDINGS: Lower chest: The lung bases demonstrate bibasilar atelectasis. No focal pulmonary lesion or pleural effusion. The heart is normal in size. No pericardial effusion. The distal esophagus is grossly normal.  Hepatobiliary: Stable mild intrahepatic biliary dilatation. Stable left hepatic lobe cyst. The complex calcified lesion in the right hepatic lobe in segment 5 is unchanged. Continued observation is suggested. No common bile duct dilatation.  Pancreas: Normal  Spleen: Normal  Adrenals/Urinary Tract: The left adrenal gland and left kidney are normal except for stable parapelvic and parenchymal renal cysts.  The right kidney also demonstrates stable parapelvic and parenchymal renal cysts. I do not see any persistent enhancing renal mass but there is an enlarging perinephric fluid collection which is displacing the kidney anteriorly and inferiorly. It previously measured 5.0 x 3.9 cm and now measures 8.7 x 8.6 cm. There is irregular enhancement around the fluid collection. The delayed renal images demonstrate  leaking contrast into this fluid collection consistent with a enlarging urinoma.  Stomach/Bowel: The stomach, duodenum, visualized small bowel and visualized colon are grossly normal.  Vascular/Lymphatic: No mesenteric or retroperitoneal mass or adenopathy. Small scattered lymph nodes are stable. The aorta and branch vessels are patent. The major venous structures are patent.  Musculoskeletal: No significant bony findings.  IMPRESSION: 1. Enlarging right perinephric fluid collection consistent with a urinoma with active leaking from a superior posterior calyx demonstrated on the delayed images. 2. Stable complex calcified right hepatic lesion. Recommend continued observation. Findings called to Dr. Annamaria Boots who will be seeing the patient in the IR Clinic today.   Electronically Signed   By: Marijo Sanes M.D.   On: 04/23/2014 10:40    Labs:  CBC:  Recent Labs  02/20/14 1616 03/17/14 1100 04/23/14 0901 04/23/14 1344 04/24/14 0442  WBC 13.4* 8.7  --  17.4* 11.5*  HGB 11.7* 12.2 13.9 11.8* 9.9*  HCT 35.3* 38.7 41.0 37.1 31.4*  PLT 552* 477*  --  273 233    COAGS:  Recent Labs  12/05/13 0904 02/04/14 1525 04/23/14 1344  INR 1.02 0.94 1.13  APTT 24 28 25     BMP:  Recent Labs  02/04/14 1525  02/08/14 0433  02/20/14 1230 03/17/14 1100 04/23/14 0901 04/23/14 1344 04/24/14 0442  NA 143  --  139  --  141 139 135 137 137  K 3.1*  < > 4.8  --  3.7 4.0 3.5 3.6 2.8*  CL 108  --  103  --  100 96 96 97  101  CO2 28  --  30  --  28 27  --  27 27  GLUCOSE 88  --  127*  --  107* 83 112* 105* 119*  BUN 23  --  16  --  10 11 25* 26* 19  CALCIUM 8.7  --  8.7  --  9.7 9.1  --  9.3 8.2*  CREATININE 0.46*  --  0.42*  < > 0.50 0.49* 0.90 0.95 0.58  GFRNONAA >90  --  >90  --   --   --   --  60* >90  GFRAA >90  --  >90  --   --   --   --  70* >90  < > = values in this interval not displayed.  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 Recent history of persistent right flank pain, mild temperature elevation, leukocytosis and evidence of right perinephric urinoma on CT. Status post ultrasound-guided drainage of right perinephric urinoma/abscess on 04/23/14; patient currently improved and afebrile with decreased white blood cell count currently 11.5. Hemoglobin 9.9, creatinine 0.58 and potassium 2.8. Patient's JP bulb converted to bag drainage. Recommend continued irrigation of drain while in-house; patient tentatively scheduled for cystoscopy with double-J stent placement next week by Dr. Diona Fanti; drain should remain in place until patient seen by Dr. Laurence Ferrari in interventional radiology clinic in approximately 2 weeks. Check final cultures - antibiotic therapy per ID; replace K; once patient discharged drain flushes will not be necessary. Patient should contact IR service [phone] or [phone]) with any concerns regarding drain output as outpatient. Appreciate TRH and urology assistance.   Signed: Autumn Messing 04/24/2014, 11:17 AM   I spent a total of 15 minutes in face to face in clinical consultation/evaluation, greater than 50% of which was counseling/coordinating care for right perinephric urinoma drainage

## 2014-04-24 NOTE — Evaluation (Signed)
Physical Therapy Evaluation Patient Details Name: Marie Thomas MRN: [id number] DOB: 09-Sep-1945 Today's Date: 04/24/2014   History of Present Illness  69 y.o. female with history of poliomyelitis, paraplegia, lives alone, moves around with help of a wheelchair, status post right renal mass cryoablation and biopsy 02/07/14, biopsy demonstrated angiomyolipoma, presented to IR office on 4/6 for follow-up regarding worsening right-sided flank pain and admitted for Urinoma/possible perinephric Abscess  Clinical Impression  Pt admitted with above diagnosis. Pt currently with functional limitations due to the deficits listed below (see PT Problem List).  Pt will benefit from skilled PT to increase their independence and safety with mobility to allow discharge to the venue listed below.   Pt reports transfers requiring increased time however able to perform with setup assist and states she is interested in UE exercises for strengthening since she utilizes upper body for all mobility.  Will check back on pt if remains in acute however recommend nursing set up bed to recliner transfer for pt to be OOB (pt can give cues for set up).  IR in to speak with pt during session as well.     Follow Up Recommendations Home health PT    Equipment Recommendations  None recommended by PT    Recommendations for Other Services       Precautions / Restrictions Precautions Precaution Comments: hx paraplegia      Mobility  Bed Mobility Overal bed mobility: Needs Assistance Bed Mobility: Supine to Sit;Sit to Supine     Supine to sit: Supervision;HOB elevated Sit to supine: Supervision      Transfers Overall transfer level: Needs assistance   Transfers: Comptroller transfers: Supervision   General transfer comment: pt able to perform anterior-posterior transfer bed <-> recliner with supervision for safety and managing lines  Ambulation/Gait                Stairs            Wheelchair Mobility    Modified Rankin (Stroke Patients Only)       Balance                                             Pertinent Vitals/Pain Pain Assessment: No/denies pain    Home Living Family/patient expects to be discharged to:: Private residence Living Arrangements: Alone   Type of Home: [place]: One level Home Equipment: Wheelchair - manual;Tub bench      Prior Function Level of Independence: Independent with assistive device(s)         Comments: w/c mobility, performs anterior-posterior transfers     Hand Dominance        Extremity/Trunk Assessment   Upper Extremity Assessment: Overall WFL for tasks assessed           Lower Extremity Assessment: RLE deficits/detail;LLE deficits/detail RLE Deficits / Details: 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 LLE Deficits / Details: 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     Communication   Communication: No difficulties  Cognition Arousal/Alertness: Awake/alert Behavior During Therapy: WFL for tasks assessed/performed Overall Cognitive Status: Within Functional Limits for tasks assessed                      General Comments      Exercises  Assessment/Plan    PT Assessment Patient needs continued PT services  PT Diagnosis Generalized weakness   PT Problem List Decreased strength;Decreased activity tolerance  PT Treatment Interventions DME instruction;Gait training;Functional mobility training;Patient/family education;Therapeutic activities;Therapeutic exercise;Wheelchair mobility training   PT Goals (Current goals can be found in the Care Plan section) Acute Rehab PT Goals PT Goal Formulation: With patient Time For Goal Achievement: 05/01/14 Potential to Achieve Goals: Good    Frequency Min 2X/week   Barriers to discharge        Co-evaluation               End of Session   Activity Tolerance: Patient tolerated  treatment well Patient left: in bed;with call bell/phone within reach;with family/visitor present           Time: 1638-4665 PT Time Calculation (min) (ACUTE ONLY): 27 min   Charges:   PT Evaluation $Initial PT Evaluation Tier I: 1 Procedure     PT G Codes:        Issiac Jamar,KATHrine E 04/24/2014, 1:04 PM Carmelia Bake, PT, DPT 04/24/2014 Pager: [phone]

## 2014-04-25 ENCOUNTER — Telehealth: Payer: Self-pay | Admitting: Hematology & Oncology

## 2014-04-25 LAB — CBC
HCT: 32.6 % — ABNORMAL LOW (ref 36.0–46.0)
HEMOGLOBIN: 9.9 g/dL — AB (ref 12.0–15.0)
MCH: 25.4 pg — AB (ref 26.0–34.0)
MCHC: 30.4 g/dL (ref 30.0–36.0)
MCV: 83.8 fL (ref 78.0–100.0)
Platelets: 228 10*3/uL (ref 150–400)
RBC: 3.89 MIL/uL (ref 3.87–5.11)
RDW: 16.1 % — ABNORMAL HIGH (ref 11.5–15.5)
WBC: 7.2 10*3/uL (ref 4.0–10.5)

## 2014-04-25 LAB — BASIC METABOLIC PANEL
Anion gap: 6 (ref 5–15)
BUN: 16 mg/dL (ref 6–23)
CHLORIDE: 108 mmol/L (ref 96–112)
CO2: 29 mmol/L (ref 19–32)
Calcium: 9 mg/dL (ref 8.4–10.5)
Creatinine, Ser: 0.47 mg/dL — ABNORMAL LOW (ref 0.50–1.10)
GFR calc Af Amer: >90 mL/min (ref >90)
GFR calc non Af Amer: >90 mL/min (ref >90)
Glucose, Bld: 153 mg/dL — ABNORMAL HIGH (ref 70–99)
POTASSIUM: 4.1 mmol/L (ref 3.5–5.1)
Sodium: 143 mmol/L (ref 135–145)

## 2014-04-25 MED ORDER — ENSURE ENLIVE PO LIQD: 237.0000 mL | Freq: Two times a day (BID) | ORAL | Status: DC

## 2014-04-25 MED ORDER — HYDROCODONE-ACETAMINOPHEN 5-325 MG PO TABS: 1.0000 | ORAL_TABLET | Freq: Four times a day (QID) | ORAL | Status: DC | PRN

## 2014-04-25 MED ORDER — CIPROFLOXACIN HCL 500 MG PO TABS: 500.0000 mg | ORAL_TABLET | Freq: Two times a day (BID) | ORAL | Status: DC

## 2014-04-25 NOTE — Progress Notes (Signed)
Pt. Called to short stay, pt.instructed to arrive at 0900 on Monday April 11th, nothing to eat of drink after midnight, and do not take any of her medications on Monday morning, pt. Verbalized understanding.

## 2014-04-25 NOTE — Telephone Encounter (Signed)
Received message from Dr. Marin Olp pt needs lab in 4 weeks. Katharine Look RN on 4W will let pt know about 5-6 lab in our office

## 2014-04-25 NOTE — Discharge Summary (Signed)
Physician Discharge Summary  Marie Thomas BZJ:[id number] DOB: June 23, 1945 DOA: 04/23/2014  PCP: Mathews Argyle, MD  Admit date: 04/23/2014 Discharge date: 04/25/2014  Time spent: Greater than 30 minutes  Recommendations for Outpatient Follow-up:  1. Dr. Lajean Manes, PCP on 04/29/14 at 8:15 AM. To be seen with repeat labs (CBC & BMP). 2. Dr. Stephan Minister, Urology: Stent placement scheduled for 04/28/14 at 11:30 AM. Please follow final perinephric fluid and urine culture results and either discontinue Cipro if cultures negative or make appropriate antibiotic changes.  3. Interventional radiology: MDs office will arrange for follow-up.  Discharge Diagnoses:  Principal Problem:   Urinoma/possible perinephric Abscess Active Problems:   Sepsis   Dehydration   Renal angiomyolipoma   Leukocytosis   Anemia   History of poliomyelitis   Paraplegia   Hypokalemia   Absolute anemia   Discharge Condition: Improved & Stable  Diet recommendation: Heart healthy diet  Filed Weights   04/23/14 1635 04/25/14 1132  Weight: 54.432 kg (120 lb) 58.5 kg (128 lb 15.5 oz)    History of present illness:  69 y.o. female with history of poliomyelitis, paraplegia, lives alone, moves around with help of a wheelchair, able to stand up and transfer to wheelchair, independent with home activities, status post right renal mass cryoablation and biopsy 02/07/14, biopsy demonstrated angiomyolipoma, presented to IR office on 4/6 for follow-up regarding worsening right-sided flank pain, fevers and chills. CT abdomen showed enlarging right perinephritic fluid collection with a urine leak, compatible with enlarging urinoma and possibly abscess. Interventional radiology placed a drain in the collection and obtain 300 mL of brown purulent looking fluid. Urology was consulted by IR and hospitalist service was requested to admit for further management. Patient was mildly tachycardic, soft blood pressures in the 90s  and had WBC of 17.4.   Hospital Course:   Principal Problem:  Urinoma/possible perinephric Abscess - Status post ultrasound guided drainage of right perinephric urinoma/abscess on 04/23/14 - Fluid culture: Negative to date. Gram stain showed no organisms. Requested urologist to follow final results as OP and make appropriate antibiotic adjustments. - Treated empirically with IV vancomycin and Zosyn after discussing with infectious disease M.D. Discussed again with infectious disease M.D. today and recommended Cipro at discharge which may be discontinued if cultures returned negative. - Clinically improved. - As per interventional radiology, continued irrigation of drain while in-house, drain should remain in place until patient sees Dr. Laurence Ferrari in [place] clinic in 2 weeks and drain flushes will not be necessary at discharge. -Discussed with interventional radiology team and requested them to put in necessary discharge instructions to manage drain at home.   Active Problems:  Sepsis - Hydrated with IV fluids and treated empirically with IV vancomycin and Zosyn. - Follow outstanding culture results. - Sepsis physiology resolved     Dehydration - IV fluids - Resolved   Renal angiomyolipoma - Status post cryoablation 02/07/14   Leukocytosis - Secondary to abscess and sepsis. - Resolved   Anemia - Likely chronic and stable. - Approximately 2 g hemoglobin drop since admission: Possibly dilutional. No reported bleeding. - Stable over last 48 hours. Follow CBCs outpatient   History of poliomyelitis & Paraplegia -Case management consulted and patient apparently did not qualify for SNF and declined home health services and stated that her family will be able to help her.  Stable complex calcified right hepatic lesion - Seen on CT. Continued observation per recommendations.  Hypokalemia - Replaced  Consultants:  Interventional  radiology  Urology  Procedures:  Ultrasound-guided drainage of right perinephric urinoma/abscess and drain placement on 04/24/14.   Discharge Exam:  Complaints: Feels much better. Mild intermittent pain at drain site on movement. Denied any other complaints.  Filed Vitals:   04/24/14 2217 04/25/14 0551 04/25/14 1132 04/25/14 1356  BP: 109/48 114/68  124/74  Pulse: 83 88  107  Temp: 98 F (36.7 C) 98 F (36.7 C)  98.3 F (36.8 C)  TempSrc: Oral Oral  Oral  Resp: 18 18  18   Height:      Weight:   58.5 kg (128 lb 15.5 oz)   SpO2: 97% 98%  98%    General exam: Moderately built and nourished pleasant middle-aged female patient, sitting up comfortably in bed. Does not look septic or toxic. Looks much improved compared to admission.  Respiratory system: Clear. No increased work of breathing. Cardiovascular system: S1 & S2 heard, RRR. No JVD, murmurs, gallops, clicks or pedal edema.  Gastrointestinal system: Abdomen is nondistended, soft and nontender. Normal bowel sounds heard. Right flank JP drain intact draining straw-colored clear fluid in bulb. Central nervous system: Alert and oriented. No focal neurological deficits. Extremities: Symmetric 5 x 5 power in upper extremities. Chronic paraplegia/grade 0 x 5 power in lower extremities.  Discharge Instructions      Discharge Instructions    Call MD for:  difficulty breathing, headache or visual disturbances    Complete by:  As directed      Call MD for:  extreme fatigue    Complete by:  As directed      Call MD for:  persistant dizziness or light-headedness    Complete by:  As directed      Call MD for:  persistant nausea and vomiting    Complete by:  As directed      Call MD for:  redness, tenderness, or signs of infection (pain, swelling, redness, odor or green/yellow discharge around incision site)    Complete by:  As directed      Call MD for:  severe uncontrolled pain    Complete by:  As directed      Call MD for:   temperature >100.4    Complete by:  As directed      Diet - low sodium heart healthy    Complete by:  As directed      Increase activity slowly    Complete by:  As directed             Medication List    TAKE these medications        acetaminophen 500 MG tablet  Commonly known as:  TYLENOL  Take 500 mg by mouth every 6 (six) hours as needed for mild pain.     alendronate 70 MG tablet  Commonly known as:  FOSAMAX  Take 70 mg by mouth once a week. On Thursday     ciprofloxacin 500 MG tablet  Commonly known as:  CIPRO  Take 1 tablet (500 mg total) by mouth 2 (two) times daily.     docusate sodium 100 MG capsule  Commonly known as:  COLACE  Take 1 capsule (100 mg total) by mouth every 12 (twelve) hours.     feeding supplement (ENSURE ENLIVE) Liqd  Take 237 mLs by mouth 2 (two) times daily between meals.     HYDROcodone-acetaminophen 5-325 MG per tablet  Commonly known as:  NORCO/VICODIN  Take 1-2 tablets by mouth every 6 (six) hours as needed for moderate pain or severe  pain.     Vitamin D 2000 UNITS tablet  Take 2,000 Units by mouth daily.       Follow-up Information    Follow up with DAHLSTEDT, Lillette Boxer, MD.   Specialty:  Urology   Why:  We will call to set up cystoscopy and stent placement for next week   Contact information:   [place] [address] [phone]       Follow up with Mathews Argyle, MD. Go on 04/29/2014.   Specialty:  Internal Medicine   Why:  at 8:15am   For Post Hospitalization Follow Up and repeat labs (CBC & BMP). and bring your Hospital  D/C Summary   Contact information:   [address] [place] [address] [phone]        The results of significant diagnostics from this hospitalization (including imaging, microbiology, ancillary and laboratory) are listed below for reference.    Significant Diagnostic Studies: Ir Lenise Arena W Catheter Placement  04/23/2014   CLINICAL DATA:  69 year old  female with history of a right renal lesion cryoablation on 02/07/2014. The patient presents with right flank pain, fevers and tachycardia. Recent CT demonstrated a right perinephric fluid collection which is concerning for a urinoma from a leaking calyx.  EXAM: ULTRASOUND AND FLUOROSCOPIC GUIDED PLACEMENT OF DRAINAGE CATHETER WITHIN THE RIGHT PERINEPHRIC FLUID COLLECTION.  Physician: Stephan Minister. Henn, MD  FLUOROSCOPY TIME:  18 seconds, 6 mGy  MEDICATIONS AND MEDICAL HISTORY: Zosyn 3.375 g. Versed 0.5 mg, fentanyl 25 mcg. A radiology nurse monitored the patient for moderate sedation.  ANESTHESIA/SEDATION: Moderate sedation time: 20 minutes  CONTRAST:  None  PROCEDURE: The procedure was explained to the patient. The risks and benefits of the procedure were discussed and the patient's questions were addressed. Informed consent was obtained from the patient. The patient was placed prone on the interventional table. Ultrasound was used to evaluate the right flank. The right flank was prepped and draped in sterile fashion. Maximal barrier sterile technique was utilized including caps, mask, sterile gowns, sterile gloves, sterile drape, hand hygiene and skin antiseptic.  Skin and soft tissues were anesthetized with 1% lidocaine. An 18 gauge trocar needle was directed into the fluid collection with ultrasound guidance. Cloudy yellow fluid was immediately aspirated. A stiff Amplatz wire was placed. The tract was dilated to accommodate a 12 [place] multipurpose drain. The drain was advanced into the collection with fluoroscopic guidance. Greater than 300 mL of brown cloudy fluid was removed. The collection was completely decompressed based on ultrasound. Catheter was attached to a suction bulb and sutured to the skin.  FINDINGS: There is a large complex fluid collection in the right perinephric region. Needle and drain were successful placed within the collection. Brown purulent-looking fluid was removed. The collection was  decompressed following drain placement.  Fluoroscopic images initially demonstrated residual intravenous contrast within the right kidney with mild to moderate hydronephrosis. In addition, there was contrast within the perinephric collection consistent with the known calyceal leak. Following placement of the drain, there was not only resolution of the perinephric collection but decompression of the right renal collecting system.  Estimated blood loss: Minimal  COMPLICATIONS: None  IMPRESSION: Successful drainage of the right perinephric urinoma. There is concern that the urinoma could be infected and a fluid sample was sent for culture.  Decompression of the perinephric urinoma and right renal collecting system following placement of the urinoma drain.   Electronically Signed   By: Markus Daft  M.D.   On: 04/23/2014 16:46   US Renal  04/02/2014   CLINICAL DATA:  Fever and right flank pain. Right renal cryoablation January 2016. Question renal infection.  EXAM: RENAL/URINARY TRACT ULTRASOUND COMPLETE  COMPARISON:  02/20/2014 abdominal CT  FINDINGS: Right Kidney:  Length: 10.4 cm. There is a ovoid hilar cyst which correlates with known pelvic cysts. There is no indication of abscess or subcapsular fluid collection. No asymmetric enlargement suggestive of a generalized pyelonephritis. No hydronephrosis.  Left Kidney:  Length: 10.7 cm. Pelvic cysts; no hydronephrosis. No evidence of mass.  Bladder:  Appears normal for degree of bladder distention.  IMPRESSION: 1. No indication of right renal infection. 2. Bilateral pelvic cysts.  No hydronephrosis.   Electronically Signed   By: Monte Fantasia M.D.   On: 04/02/2014 16:43   Ct Abd Wo & W Cm  04/23/2014   CLINICAL DATA:  Persistent flank pain. History of renal cryoablation with postprocedural hematoma.  EXAM: CT ABDOMEN WITHOUT AND WITH CONTRAST  TECHNIQUE: Multidetector CT imaging of the abdomen was performed following the standard protocol before and following the  bolus administration of intravenous contrast.  CONTRAST:  170mL OMNIPAQUE IOHEXOL 300 MG/ML  SOLN  COMPARISON:  CT scan 02/20/2014  FINDINGS: Lower chest: The lung bases demonstrate bibasilar atelectasis. No focal pulmonary lesion or pleural effusion. The heart is normal in size. No pericardial effusion. The distal esophagus is grossly normal.  Hepatobiliary: Stable mild intrahepatic biliary dilatation. Stable left hepatic lobe cyst. The complex calcified lesion in the right hepatic lobe in segment 5 is unchanged. Continued observation is suggested. No common bile duct dilatation.  Pancreas: Normal  Spleen: Normal  Adrenals/Urinary Tract: The left adrenal gland and left kidney are normal except for stable parapelvic and parenchymal renal cysts.  The right kidney also demonstrates stable parapelvic and parenchymal renal cysts. I do not see any persistent enhancing renal mass but there is an enlarging perinephric fluid collection which is displacing the kidney anteriorly and inferiorly. It previously measured 5.0 x 3.9 cm and now measures 8.7 x 8.6 cm. There is irregular enhancement around the fluid collection. The delayed renal images demonstrate leaking contrast into this fluid collection consistent with a enlarging urinoma.  Stomach/Bowel: The stomach, duodenum, visualized small bowel and visualized colon are grossly normal.  Vascular/Lymphatic: No mesenteric or retroperitoneal mass or adenopathy. Small scattered lymph nodes are stable. The aorta and branch vessels are patent. The major venous structures are patent.  Musculoskeletal: No significant bony findings.  IMPRESSION: 1. Enlarging right perinephric fluid collection consistent with a urinoma with active leaking from a superior posterior calyx demonstrated on the delayed images. 2. Stable complex calcified right hepatic lesion. Recommend continued observation. Findings called to Dr. Annamaria Boots who will be seeing the patient in the IR Clinic today.   Electronically  Signed   By: Marijo Sanes M.D.   On: 04/23/2014 10:40    Microbiology: Recent Results (from the past 240 hour(s))  Culture, routine-abscess     Status: None (Preliminary result)   Collection Time: 04/23/14  4:15 PM  Result Value Ref Range Status   Specimen Description ABSCESS  Final   Special Requests NONE  Final   Gram Stain   Final    ABUNDANT WBC PRESENT,BOTH PMN AND MONONUCLEAR NO SQUAMOUS EPITHELIAL CELLS SEEN NO ORGANISMS SEEN Performed at [facility]    Culture   Final    NO GROWTH 1 DAY Performed at [facility]    Report Status PENDING  Incomplete     Labs: Basic Metabolic Panel:  Recent Labs Lab 04/23/14 0901 04/23/14 1344 04/24/14 0442 04/25/14 0522  NA 135 137 137 143  K 3.5 3.6 2.8* 4.1  CL 96 97 101 108  CO2  --  27 27 29   GLUCOSE 112* 105* 119* 153*  BUN 25* 26* 19 16  CREATININE 0.90 0.95 0.58 0.47*  CALCIUM  --  9.3 8.2* 9.0  MG  --   --  1.9  --    Liver Function Tests:  Recent Labs Lab 04/23/14 1344  AST 23  ALT 22  ALKPHOS 109  BILITOT 0.8  PROT 7.2  ALBUMIN 3.2*   No results for input(s): LIPASE, AMYLASE in the last 168 hours. No results for input(s): AMMONIA in the last 168 hours. CBC:  Recent Labs Lab 04/23/14 0901 04/23/14 1344 04/24/14 0442 04/25/14 0522  WBC  --  17.4* 11.5* 7.2  HGB 13.9 11.8* 9.9* 9.9*  HCT 41.0 37.1 31.4* 32.6*  MCV  --  81.5 82.4 83.8  PLT  --  273 233 228   Cardiac Enzymes: No results for input(s): CKTOTAL, CKMB, CKMBINDEX, TROPONINI in the last 168 hours. BNP: BNP (last 3 results) No results for input(s): BNP in the last 8760 hours.  ProBNP (last 3 results) No results for input(s): PROBNP in the last 8760 hours.  CBG: No results for input(s): GLUCAP in the last 168 hours.     Signed:  Vernell Leep, MD, FACP, FHM. [facility] Pager [phone]  If 7PM-7AM, please contact night-coverage [email or web address] Password [email or web address] 04/25/2014, 3:40 PM

## 2014-04-25 NOTE — Discharge Instructions (Addendum)
Sepsis Sepsis is a serious infection of your blood or tissues that affects your whole body. The infection that causes sepsis may be bacterial, viral, fungal, or parasitic. Sepsis may be life threatening. Sepsis can cause your blood pressure to drop. This may result in shock. Shock causes your central nervous system and your organs to stop working correctly.  RISK FACTORS Sepsis can happen in anyone, but it is more likely to happen in people who have weakened immune systems. SIGNS AND SYMPTOMS  Symptoms of sepsis can include:  Fever or low body temperature (hypothermia).  Rapid breathing (hyperventilation).  Chills.  Rapid heartbeat (tachycardia).  Confusion or light-headedness.  Trouble breathing.  Urinating much less than usual.  Cool, clammy skin or red, flushed skin.  Other problems with the heart, kidneys, or brain. DIAGNOSIS  Your health care provider will likely do tests to look for an infection, to see if the infection has spread to your blood, and to see how serious your condition is. Tests can include:  Blood tests, including cultures of your blood.  Cultures of other fluids from your body, such as:  Urine.  Pus from wounds.  Mucus coughed up from your lungs.  Urine tests other than cultures.  X-ray exams or other imaging tests. TREATMENT  Treatment will begin with elimination of the source of infection. If your sepsis is likely caused by a bacterial or fungal infection, you will be given antibiotic or antifungal medicines. You may also receive:  Oxygen.  Fluids through an IV tube.  Medicines to increase your blood pressure.  A machine to clean your blood (dialysis) if your kidneys fail.  A machine to help you breathe if your lungs fail. SEEK IMMEDIATE MEDICAL CARE IF: You get an infection or develop any of the signs and symptoms of sepsis after surgery or a hospitalization. Document Released: 10/02/2002 Document Revised: 01/08/2013 Document Reviewed:  09/10/2012 [facility] Patient Information 2015 [place], [place]. This information is not intended to replace advice given to you by your health care provider. Make sure you discuss any questions you have with your health care provider.  For drain care, change dressing to insertion site every 1-2 days, record output of drain daily, contact [phone] or [phone] with any additional questions.

## 2014-04-25 NOTE — Progress Notes (Addendum)
ANTIBIOTIC CONSULT NOTE - follow-up  Pharmacy Consult for Vancomycin, Zosyn Indication: Urinoma Vs Perinephric abscess  No Known Allergies  Patient Measurements: Height: 5\' 2"  (157.5 cm) Weight: 120 lb (54.432 kg) IBW/kg (Calculated) : 50.1  Vital Signs: Temp: 98 F (36.7 C) (04/08 0551) Temp Source: Oral (04/08 0551) BP: 114/68 mmHg (04/08 0551) Pulse Rate: 88 (04/08 0551) Intake/Output from previous day: 04/07 0701 - 04/08 0700 In: 20  Out: 1680 [Urine:1350; Drains:330]  Labs:  Recent Labs  04/23/14 1344 04/24/14 0442 04/25/14 0522  WBC 17.4* 11.5* 7.2  HGB 11.8* 9.9* 9.9*  PLT 273 233 228  CREATININE 0.95 0.58 0.47*   Estimated Creatinine Clearance: 53.2 mL/min (by C-G formula based on Cr of 0.47). No results for input(s): VANCOTROUGH, VANCOPEAK, VANCORANDOM, GENTTROUGH, GENTPEAK, GENTRANDOM, TOBRATROUGH, TOBRAPEAK, TOBRARND, AMIKACINPEAK, AMIKACINTROU, AMIKACIN in the last 72 hours.   Microbiology: Recent Results (from the past 720 hour(s))  Culture, routine-abscess     Status: None (Preliminary result)   Collection Time: 04/23/14  4:15 PM  Result Value Ref Range Status   Specimen Description ABSCESS  Final   Special Requests NONE  Final   Gram Stain   Final    ABUNDANT WBC PRESENT,BOTH PMN AND MONONUCLEAR NO SQUAMOUS EPITHELIAL CELLS SEEN NO ORGANISMS SEEN Performed at [facility]    Culture   Final    NO GROWTH 1 DAY Performed at [facility]    Report Status PENDING  Incomplete     Medications:  Anti-infectives    Start     Dose/Rate Route Frequency Ordered Stop   04/24/14 2030  vancomycin (VANCOCIN) 500 mg in sodium chloride 0.9 % 100 mL IVPB  Status:  Discontinued     500 mg 100 mL/hr over 60 Minutes Intravenous  Once 04/24/14 0736 04/24/14 0755   04/24/14 1800  vancomycin (VANCOCIN) 500 mg in sodium chloride 0.9 % 100 mL IVPB     500 mg 100 mL/hr over 60 Minutes Intravenous Every 12 hours 04/24/14 0740     04/24/14 0900   vancomycin (VANCOCIN) 500 mg in sodium chloride 0.9 % 100 mL IVPB     500 mg 100 mL/hr over 60 Minutes Intravenous  Once 04/24/14 0755 04/24/14 0956   04/23/14 2200  piperacillin-tazobactam (ZOSYN) IVPB 3.375 g  Status:  Discontinued     3.375 g 12.5 mL/hr over 240 Minutes Intravenous 3 times per day 04/23/14 1743 04/23/14 1744   04/23/14 2200  piperacillin-tazobactam (ZOSYN) IVPB 3.375 g     3.375 g 12.5 mL/hr over 240 Minutes Intravenous 3 times per day 04/23/14 1744     04/23/14 1745  vancomycin (VANCOCIN) IVPB 1000 mg/200 mL premix     1,000 mg 200 mL/hr over 60 Minutes Intravenous STAT 04/23/14 1733 04/23/14 1928   04/23/14 1315  piperacillin-tazobactam (ZOSYN) IVPB 3.375 g  Status:  Discontinued     3.375 g 12.5 mL/hr over 240 Minutes Intravenous 3 times per day 04/23/14 1249 04/23/14 1634     Assessment: Marie Thomas admitted on 4/6 with worsening flank pain.  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  CT today with enlarging perinephric fluid collection, active urine leak.  Concern for Urinoma Vs Perinephric abscess, s/p drain placement (4/6) with culture.  Pharmacy is consulted to dose vancomycin and Zosyn.  4/6 >> Vanc >> 4/6 >> Zosyn >>    4/6 Abscess culture: NGTD  Today, 04/25/2014:  Tmax: afebrile  WBC:  improved to WNL  Renal: SCr low (Note paraplegia from polio), Normalized CrCl (using SCr 0.8) ~ 76 ml/min    Goal of Therapy:  Vancomycin trough level 15-20 mcg/ml  Plan:   Continue Zosyn 3.375g IV Q8H infused over 4hrs.  Continue Vancomycin 500 mg IV q12h.  Measure Vanc trough at steady state - possibility may go home so hold off on trough at this time.  Consider trough Sunday morning if not discharged prior to urology plan to place stent Monday.   Follow up renal fxn, culture results, and clinical course.  Doreene Eland, PharmD, BCPS.   Pager: [phone] 04/25/2014  11:28 AM

## 2014-04-25 NOTE — Progress Notes (Signed)
2 Days Post-Op Subjective: Patient reports that she is feeling much better. No fevers or chills.  Objective: Vital signs in last 24 hours: Temp:  [97.7 F (36.5 C)-98 F (36.7 C)] 98 F (36.7 C) (04/08 0551) Pulse Rate:  [83-104] 88 (04/08 0551) Resp:  [18] 18 (04/08 0551) BP: (109-114)/(48-68) 114/68 mmHg (04/08 0551) SpO2:  [97 %-98 %] 98 % (04/08 0551)  Intake/Output from previous day: 04/07 0701 - 04/08 0700 In: 20  Out: 1680 [Urine:1350; Drains:330] Intake/Output this shift:    Physical Exam:  Constitutional: Vital signs reviewed. WD WN in NAD   Eyes: PERRL, No scleral icterus.   Cardiovascular: RRR Pulmonary/Chest: Normal effort Abdominal: Soft. Non-tender, non-distended, bowel sounds are normal, no masses, organomegaly, or guarding present.  Genitourinary: Extremities: No cyanosis or edema   She has had 8 ounces of clear drainage from her percutaneous drain over the past 12 hours Lab Results:  Recent Labs  04/23/14 1344 04/24/14 0442 04/25/14 0522  HGB 11.8* 9.9* 9.9*  HCT 37.1 31.4* 32.6*   BMET  Recent Labs  04/24/14 0442 04/25/14 0522  NA 137 143  K 2.8* 4.1  CL 101 108  CO2 27 29  GLUCOSE 119* 153*  BUN 19 16  CREATININE 0.58 0.47*  CALCIUM 8.2* 9.0    Recent Labs  04/23/14 1344  INR 1.13   No results for input(s): LABURIN in the last 72 hours. Results for orders placed or performed during the hospital encounter of 04/23/14  Culture, routine-abscess     Status: None (Preliminary result)   Collection Time: 04/23/14  4:15 PM  Result Value Ref Range Status   Specimen Description ABSCESS  Final   Special Requests NONE  Final   Gram Stain   Final    ABUNDANT WBC PRESENT,BOTH PMN AND MONONUCLEAR NO SQUAMOUS EPITHELIAL CELLS SEEN NO ORGANISMS SEEN Performed at [facility]    Culture   Final    NO GROWTH 1 DAY Performed at [facility]    Report Status PENDING  Incomplete    Studies/Results: Ir Lenise Arena W  Catheter Placement  04/23/2014   CLINICAL DATA:  69 year old female with history of a right renal lesion cryoablation on 02/07/2014. The patient presents with right flank pain, fevers and tachycardia. Recent CT demonstrated a right perinephric fluid collection which is concerning for a urinoma from a leaking calyx.  EXAM: ULTRASOUND AND FLUOROSCOPIC GUIDED PLACEMENT OF DRAINAGE CATHETER WITHIN THE RIGHT PERINEPHRIC FLUID COLLECTION.  Physician: Stephan Minister. Henn, MD  FLUOROSCOPY TIME:  18 seconds, 6 mGy  MEDICATIONS AND MEDICAL HISTORY: Zosyn 3.375 g. Versed 0.5 mg, fentanyl 25 mcg. A radiology nurse monitored the patient for moderate sedation.  ANESTHESIA/SEDATION: Moderate sedation time: 20 minutes  CONTRAST:  None  PROCEDURE: The procedure was explained to the patient. The risks and benefits of the procedure were discussed and the patient's questions were addressed. Informed consent was obtained from the patient. The patient was placed prone on the interventional table. Ultrasound was used to evaluate the right flank. The right flank was prepped and draped in sterile fashion. Maximal barrier sterile technique was utilized including caps, mask, sterile gowns, sterile gloves, sterile drape, hand hygiene and skin antiseptic.  Skin and soft tissues were anesthetized with 1% lidocaine. An 18 gauge trocar needle was directed into the fluid collection with ultrasound guidance. Cloudy yellow fluid was immediately aspirated. A stiff Amplatz wire was placed. The tract was dilated to accommodate a 12 [place] multipurpose drain. The drain was  advanced into the collection with fluoroscopic guidance. Greater than 300 mL of brown cloudy fluid was removed. The collection was completely decompressed based on ultrasound. Catheter was attached to a suction bulb and sutured to the skin.  FINDINGS: There is a large complex fluid collection in the right perinephric region. Needle and drain were successful placed within the collection. Brown  purulent-looking fluid was removed. The collection was decompressed following drain placement.  Fluoroscopic images initially demonstrated residual intravenous contrast within the right kidney with mild to moderate hydronephrosis. In addition, there was contrast within the perinephric collection consistent with the known calyceal leak. Following placement of the drain, there was not only resolution of the perinephric collection but decompression of the right renal collecting system.  Estimated blood loss: Minimal  COMPLICATIONS: None  IMPRESSION: Successful drainage of the right perinephric urinoma. There is concern that the urinoma could be infected and a fluid sample was sent for culture.  Decompression of the perinephric urinoma and right renal collecting system following placement of the urinoma drain.   Electronically Signed   By: Markus Daft M.D.   On: 04/23/2014 16:46   Ct Abd Wo & W Cm  04/23/2014   CLINICAL DATA:  Persistent flank pain. History of renal cryoablation with postprocedural hematoma.  EXAM: CT ABDOMEN WITHOUT AND WITH CONTRAST  TECHNIQUE: Multidetector CT imaging of the abdomen was performed following the standard protocol before and following the bolus administration of intravenous contrast.  CONTRAST:  12mL OMNIPAQUE IOHEXOL 300 MG/ML  SOLN  COMPARISON:  CT scan 02/20/2014  FINDINGS: Lower chest: The lung bases demonstrate bibasilar atelectasis. No focal pulmonary lesion or pleural effusion. The heart is normal in size. No pericardial effusion. The distal esophagus is grossly normal.  Hepatobiliary: Stable mild intrahepatic biliary dilatation. Stable left hepatic lobe cyst. The complex calcified lesion in the right hepatic lobe in segment 5 is unchanged. Continued observation is suggested. No common bile duct dilatation.  Pancreas: Normal  Spleen: Normal  Adrenals/Urinary Tract: The left adrenal gland and left kidney are normal except for stable parapelvic and parenchymal renal cysts.  The  right kidney also demonstrates stable parapelvic and parenchymal renal cysts. I do not see any persistent enhancing renal mass but there is an enlarging perinephric fluid collection which is displacing the kidney anteriorly and inferiorly. It previously measured 5.0 x 3.9 cm and now measures 8.7 x 8.6 cm. There is irregular enhancement around the fluid collection. The delayed renal images demonstrate leaking contrast into this fluid collection consistent with a enlarging urinoma.  Stomach/Bowel: The stomach, duodenum, visualized small bowel and visualized colon are grossly normal.  Vascular/Lymphatic: No mesenteric or retroperitoneal mass or adenopathy. Small scattered lymph nodes are stable. The aorta and branch vessels are patent. The major venous structures are patent.  Musculoskeletal: No significant bony findings.  IMPRESSION: 1. Enlarging right perinephric fluid collection consistent with a urinoma with active leaking from a superior posterior calyx demonstrated on the delayed images. 2. Stable complex calcified right hepatic lesion. Recommend continued observation. Findings called to Dr. Annamaria Boots who will be seeing the patient in the IR Clinic today.   Electronically Signed   By: Marijo Sanes M.D.   On: 04/23/2014 10:40    Assessment/Plan:   Status post placement of a percutaneous drain other than urinoma. She's doing well. She is on for stent placement with me on Monday morning at 11:30. I will leave it up to internal medicine and the patient as to whether to go home over  the weekend or stay here until the procedure.   LOS: 2 days   Franchot Gallo M 04/25/2014, 8:03 AM

## 2014-04-25 NOTE — Progress Notes (Signed)
Patient ID: Marie Thomas, female   DOB: 28-May-1945, 69 y.o.   MRN: [id number]    Referring Physician(s): Ennever,P  Subjective:  Pt doing well; states she is probably going home today  Allergies: Review of patient's allergies indicates no known allergies.  Medications: Prior to Admission medications   Medication Sig Start Date End Date Taking? Authorizing Provider  acetaminophen (TYLENOL) 500 MG tablet Take 500 mg by mouth every 6 (six) hours as needed for mild pain.    Yes Historical Provider, MD  alendronate (FOSAMAX) 70 MG tablet Take 70 mg by mouth once a week. On Thursday 08/22/13  Yes Historical Provider, MD  Cholecalciferol (VITAMIN D) 2000 UNITS tablet Take 2,000 Units by mouth daily.   Yes Historical Provider, MD  docusate sodium (COLACE) 100 MG capsule Take 1 capsule (100 mg total) by mouth every 12 (twelve) hours. 11/13/13  Yes Sherwood Gambler, MD  HYDROcodone-acetaminophen (NORCO/VICODIN) 5-325 MG per tablet Take 1-2 tablets by mouth every 4 (four) hours as needed for moderate pain. 02/08/14  Yes Ascencion Dike, PA-C     Vital Signs: BP 114/68 mmHg  Pulse 88  Temp(Src) 98 F (36.7 C) (Oral)  Resp 18  Ht 5\' 2"  (1.575 m)  Wt 120 lb (54.432 kg)  BMI 21.94 kg/m2  SpO2 98%  Physical Exam pt awake/alert; rt flank drain intact, output 330 cc's slightly turbid, yellow fluid/urine; cx's neg to date; insertion site clean and dry,NT  Imaging: Ir Guided Drain W Catheter Placement  04/23/2014   CLINICAL DATA:  69 year old female with history of a right renal lesion cryoablation on 02/07/2014. The patient presents with right flank pain, fevers and tachycardia. Recent CT demonstrated a right perinephric fluid collection which is concerning for a urinoma from a leaking calyx.  EXAM: ULTRASOUND AND FLUOROSCOPIC GUIDED PLACEMENT OF DRAINAGE CATHETER WITHIN THE RIGHT PERINEPHRIC FLUID COLLECTION.  Physician: Stephan Minister. Henn, MD  FLUOROSCOPY TIME:  18 seconds, 6 mGy  MEDICATIONS AND MEDICAL  HISTORY: Zosyn 3.375 g. Versed 0.5 mg, fentanyl 25 mcg. A radiology nurse monitored the patient for moderate sedation.  ANESTHESIA/SEDATION: Moderate sedation time: 20 minutes  CONTRAST:  None  PROCEDURE: The procedure was explained to the patient. The risks and benefits of the procedure were discussed and the patient's questions were addressed. Informed consent was obtained from the patient. The patient was placed prone on the interventional table. Ultrasound was used to evaluate the right flank. The right flank was prepped and draped in sterile fashion. Maximal barrier sterile technique was utilized including caps, mask, sterile gowns, sterile gloves, sterile drape, hand hygiene and skin antiseptic.  Skin and soft tissues were anesthetized with 1% lidocaine. An 18 gauge trocar needle was directed into the fluid collection with ultrasound guidance. Cloudy yellow fluid was immediately aspirated. A stiff Amplatz wire was placed. The tract was dilated to accommodate a 12 [place] multipurpose drain. The drain was advanced into the collection with fluoroscopic guidance. Greater than 300 mL of brown cloudy fluid was removed. The collection was completely decompressed based on ultrasound. Catheter was attached to a suction bulb and sutured to the skin.  FINDINGS: There is a large complex fluid collection in the right perinephric region. Needle and drain were successful placed within the collection. Brown purulent-looking fluid was removed. The collection was decompressed following drain placement.  Fluoroscopic images initially demonstrated residual intravenous contrast within the right kidney with mild to moderate hydronephrosis. In addition, there was contrast within the perinephric collection consistent with the known calyceal leak.  Following placement of the drain, there was not only resolution of the perinephric collection but decompression of the right renal collecting system.  Estimated blood loss: Minimal   COMPLICATIONS: None  IMPRESSION: Successful drainage of the right perinephric urinoma. There is concern that the urinoma could be infected and a fluid sample was sent for culture.  Decompression of the perinephric urinoma and right renal collecting system following placement of the urinoma drain.   Electronically Signed   By: Markus Daft M.D.   On: 04/23/2014 16:46   Ct Abd Wo & W Cm  04/23/2014   CLINICAL DATA:  Persistent flank pain. History of renal cryoablation with postprocedural hematoma.  EXAM: CT ABDOMEN WITHOUT AND WITH CONTRAST  TECHNIQUE: Multidetector CT imaging of the abdomen was performed following the standard protocol before and following the bolus administration of intravenous contrast.  CONTRAST:  168mL OMNIPAQUE IOHEXOL 300 MG/ML  SOLN  COMPARISON:  CT scan 02/20/2014  FINDINGS: Lower chest: The lung bases demonstrate bibasilar atelectasis. No focal pulmonary lesion or pleural effusion. The heart is normal in size. No pericardial effusion. The distal esophagus is grossly normal.  Hepatobiliary: Stable mild intrahepatic biliary dilatation. Stable left hepatic lobe cyst. The complex calcified lesion in the right hepatic lobe in segment 5 is unchanged. Continued observation is suggested. No common bile duct dilatation.  Pancreas: Normal  Spleen: Normal  Adrenals/Urinary Tract: The left adrenal gland and left kidney are normal except for stable parapelvic and parenchymal renal cysts.  The right kidney also demonstrates stable parapelvic and parenchymal renal cysts. I do not see any persistent enhancing renal mass but there is an enlarging perinephric fluid collection which is displacing the kidney anteriorly and inferiorly. It previously measured 5.0 x 3.9 cm and now measures 8.7 x 8.6 cm. There is irregular enhancement around the fluid collection. The delayed renal images demonstrate leaking contrast into this fluid collection consistent with a enlarging urinoma.  Stomach/Bowel: The stomach,  duodenum, visualized small bowel and visualized colon are grossly normal.  Vascular/Lymphatic: No mesenteric or retroperitoneal mass or adenopathy. Small scattered lymph nodes are stable. The aorta and branch vessels are patent. The major venous structures are patent.  Musculoskeletal: No significant bony findings.  IMPRESSION: 1. Enlarging right perinephric fluid collection consistent with a urinoma with active leaking from a superior posterior calyx demonstrated on the delayed images. 2. Stable complex calcified right hepatic lesion. Recommend continued observation. Findings called to Dr. Annamaria Boots who will be seeing the patient in the IR Clinic today.   Electronically Signed   By: Marijo Sanes M.D.   On: 04/23/2014 10:40    Labs:  CBC:  Recent Labs  03/17/14 1100 04/23/14 0901 04/23/14 1344 04/24/14 0442 04/25/14 0522  WBC 8.7  --  17.4* 11.5* 7.2  HGB 12.2 13.9 11.8* 9.9* 9.9*  HCT 38.7 41.0 37.1 31.4* 32.6*  PLT 477*  --  273 233 228    COAGS:  Recent Labs  12/05/13 0904 02/04/14 1525 04/23/14 1344  INR 1.02 0.94 1.13  APTT 24 28 25     BMP:  Recent Labs  02/08/14 0433  03/17/14 1100 04/23/14 0901 04/23/14 1344 04/24/14 0442 04/25/14 0522  NA 139  < > 139 135 137 137 143  K 4.8  < > 4.0 3.5 3.6 2.8* 4.1  CL 103  < > 96 96 97 101 108  CO2 30  < > 27  --  27 27 29   GLUCOSE 127*  < > 83 112* 105* 119* 153*  BUN 16  < > 11 25* 26* 19 16  CALCIUM 8.7  < > 9.1  --  9.3 8.2* 9.0  CREATININE 0.42*  < > 0.49* 0.90 0.95 0.58 0.47*  GFRNONAA >90  --   --   --  60* >90 >90  GFRAA >90  --   --   --  70* >90 >90  < > = values in this interval not displayed.  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 Recent history of persistent right flank pain,  mild temperature elevation, leukocytosis and evidence of right perinephric urinoma on CT. Status post ultrasound-guided drainage of right perinephric urinoma/abscess on 04/23/14; pt afebrile; WBC nl; creat .47; urinoma cx's neg to date; plan is for JJ stent placement by Dr. Diona Fanti on 4/11; pt to cont drain for now and f/u with Dr. Laurence Ferrari week after next in IR clinic [phone] will remove drain once output diminishes and f/u CT shows resolution of urinoma. Drain does not need to be irrigated as OP. Antibiotic therapy per TRH/ID. Greatly appreciate TRH/urology assistance .   Signed: Autumn Messing 04/25/2014, 9:34 AM   I spent a total of 15 minutes in face to face in clinical consultation/evaluation, greater than 50% of which was counseling/coordinating care for right perinephric urinoma drainage

## 2014-04-26 LAB — URINE CULTURE
CULTURE: NO GROWTH
Colony Count: NO GROWTH

## 2014-04-27 ENCOUNTER — Other Ambulatory Visit: Payer: Self-pay | Admitting: Radiology

## 2014-04-27 DIAGNOSIS — IMO0002 Reserved for concepts with insufficient information to code with codable children: Secondary | ICD-10-CM

## 2014-04-27 LAB — CULTURE, ROUTINE-ABSCESS: CULTURE: NO GROWTH

## 2014-04-28 ENCOUNTER — Ambulatory Visit (HOSPITAL_COMMUNITY)
Admission: RE | Admit: 2014-04-28 | Discharge: 2014-04-28 | Disposition: A | Discharge status: Home / Self Care | Payer: PPO | Source: Ambulatory Visit | Attending: Urology | Admitting: Urology

## 2014-04-28 ENCOUNTER — Ambulatory Visit (HOSPITAL_COMMUNITY): Payer: PPO

## 2014-04-28 ENCOUNTER — Ambulatory Visit (HOSPITAL_COMMUNITY): Payer: PPO | Admitting: Anesthesiology

## 2014-04-28 ENCOUNTER — Encounter (HOSPITAL_COMMUNITY): Payer: Self-pay | Admitting: *Deleted

## 2014-04-28 ENCOUNTER — Encounter (HOSPITAL_COMMUNITY)
Admission: RE | Disposition: A | Discharge status: Home / Self Care | Payer: Self-pay | Source: Ambulatory Visit | Attending: Urology

## 2014-04-28 DIAGNOSIS — G8220 Paraplegia, unspecified: Secondary | ICD-10-CM | POA: Diagnosis not present

## 2014-04-28 DIAGNOSIS — R32 Unspecified urinary incontinence: Secondary | ICD-10-CM | POA: Diagnosis not present

## 2014-04-28 DIAGNOSIS — D649 Anemia, unspecified: Secondary | ICD-10-CM | POA: Diagnosis not present

## 2014-04-28 DIAGNOSIS — G43909 Migraine, unspecified, not intractable, without status migrainosus: Secondary | ICD-10-CM | POA: Diagnosis not present

## 2014-04-28 DIAGNOSIS — I10 Essential (primary) hypertension: Secondary | ICD-10-CM | POA: Diagnosis not present

## 2014-04-28 DIAGNOSIS — Z90710 Acquired absence of both cervix and uterus: Secondary | ICD-10-CM | POA: Insufficient documentation

## 2014-04-28 DIAGNOSIS — N200 Calculus of kidney: Secondary | ICD-10-CM

## 2014-04-28 DIAGNOSIS — IMO0002 Reserved for concepts with insufficient information to code with codable children: Secondary | ICD-10-CM

## 2014-04-28 DIAGNOSIS — N368 Other specified disorders of urethra: Secondary | ICD-10-CM | POA: Diagnosis present

## 2014-04-28 HISTORY — PX: CYSTOSCOPY WITH STENT PLACEMENT: SHX5790

## 2014-04-28 SURGERY — CYSTOSCOPY, WITH STENT INSERTION
Anesthesia: General | Laterality: Right

## 2014-04-28 MED ORDER — LIDOCAINE HCL (CARDIAC) 20 MG/ML IV SOLN
INTRAVENOUS | Status: AC
  Filled 2014-04-28: qty 5

## 2014-04-28 MED ORDER — PROPOFOL 10 MG/ML IV BOLUS
INTRAVENOUS | Status: AC
  Filled 2014-04-28: qty 20

## 2014-04-28 MED ORDER — MEPERIDINE HCL 50 MG/ML IJ SOLN
6.2500 mg | INTRAMUSCULAR | Status: DC | PRN
Start: 2014-04-28 — End: 2014-04-28

## 2014-04-28 MED ORDER — LIDOCAINE HCL (CARDIAC) 20 MG/ML IV SOLN
INTRAVENOUS | Status: DC | PRN
  Administered 2014-04-28: 40 mg via INTRAVENOUS

## 2014-04-28 MED ORDER — CEFAZOLIN SODIUM-DEXTROSE 2-3 GM-% IV SOLR
INTRAVENOUS | Status: AC
  Filled 2014-04-28: qty 50

## 2014-04-28 MED ORDER — CEFAZOLIN SODIUM-DEXTROSE 2-3 GM-% IV SOLR
2.0000 g | INTRAVENOUS | Status: AC
  Administered 2014-04-28: 2 g via INTRAVENOUS

## 2014-04-28 MED ORDER — LACTATED RINGERS IV SOLN
INTRAVENOUS | Status: DC
  Administered 2014-04-28: 1000 mL via INTRAVENOUS

## 2014-04-28 MED ORDER — SODIUM CHLORIDE 0.9 % IR SOLN
Status: DC | PRN
  Administered 2014-04-28: 3000 mL

## 2014-04-28 MED ORDER — ONDANSETRON HCL 4 MG/2ML IJ SOLN
INTRAMUSCULAR | Status: DC | PRN
  Administered 2014-04-28: 4 mg via INTRAVENOUS

## 2014-04-28 MED ORDER — FENTANYL CITRATE 0.05 MG/ML IJ SOLN: 25.0000 ug | INTRAMUSCULAR | Status: DC | PRN

## 2014-04-28 MED ORDER — FENTANYL CITRATE 0.05 MG/ML IJ SOLN
INTRAMUSCULAR | Status: DC | PRN
  Administered 2014-04-28: 25 ug via INTRAVENOUS

## 2014-04-28 MED ORDER — ONDANSETRON HCL 4 MG/2ML IJ SOLN
INTRAMUSCULAR | Status: AC
  Filled 2014-04-28: qty 2

## 2014-04-28 MED ORDER — IOHEXOL 300 MG/ML  SOLN
INTRAMUSCULAR | Status: DC | PRN
  Administered 2014-04-28: 10 mL

## 2014-04-28 MED ORDER — PROPOFOL 10 MG/ML IV BOLUS
INTRAVENOUS | Status: DC | PRN
  Administered 2014-04-28: 120 mg via INTRAVENOUS

## 2014-04-28 MED ORDER — FENTANYL CITRATE 0.05 MG/ML IJ SOLN
INTRAMUSCULAR | Status: AC
  Filled 2014-04-28: qty 2

## 2014-04-28 MED ORDER — PROMETHAZINE HCL 25 MG/ML IJ SOLN: 6.2500 mg | INTRAMUSCULAR | Status: DC | PRN

## 2014-04-28 MED ORDER — LACTATED RINGERS IV SOLN: INTRAVENOUS | Status: DC

## 2014-04-28 MED ORDER — DEXAMETHASONE SODIUM PHOSPHATE 10 MG/ML IJ SOLN
INTRAMUSCULAR | Status: DC | PRN
  Administered 2014-04-28: 10 mg via INTRAVENOUS

## 2014-04-28 SURGICAL SUPPLY — 22 items
BAG URO CATCHER STRL LF (DRAPE) ×3 IMPLANT
BASKET LASER NITINOL 1.9FR (BASKET) IMPLANT
BASKET ZERO TIP NITINOL 2.4FR (BASKET) IMPLANT
CATH INTERMIT  6FR 70CM (CATHETERS) ×3 IMPLANT
CLOTH BEACON ORANGE TIMEOUT ST (SAFETY) ×3 IMPLANT
FIBER LASER FLEXIVA 200 (UROLOGICAL SUPPLIES) IMPLANT
FIBER LASER FLEXIVA 365 (UROLOGICAL SUPPLIES) IMPLANT
FIBER LASER TRAC TIP (UROLOGICAL SUPPLIES) IMPLANT
GLOVE BIOGEL M STRL SZ7.5 (GLOVE) ×3 IMPLANT
GOWN STRL REUS W/ TWL XL LVL3 (GOWN DISPOSABLE) ×1 IMPLANT
GOWN STRL REUS W/TWL LRG LVL3 (GOWN DISPOSABLE) ×6 IMPLANT
GOWN STRL REUS W/TWL XL LVL3 (GOWN DISPOSABLE) ×2
GUIDEWIRE ANG ZIPWIRE 038X150 (WIRE) IMPLANT
GUIDEWIRE STR DUAL SENSOR (WIRE) ×3 IMPLANT
IV NS 1000ML (IV SOLUTION) ×2
IV NS 1000ML BAXH (IV SOLUTION) ×1 IMPLANT
MANIFOLD NEPTUNE II (INSTRUMENTS) ×3 IMPLANT
PACK CYSTO (CUSTOM PROCEDURE TRAY) ×3 IMPLANT
SHIELD EYE BINOCULAR (MISCELLANEOUS) IMPLANT
STENT URET 6FRX24 CONTOUR (STENTS) ×3 IMPLANT
TUBING CONNECTING 10 (TUBING) ×2 IMPLANT
TUBING CONNECTING 10' (TUBING) ×1

## 2014-04-28 NOTE — H&P (Signed)
H&P  Chief Complaint: Urinary leak  History of Present Illness: Marie Thomas is a 69 y.o. 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 She developed right flank pain shortly afterwards. She had a followup CT last week which revealed a large perinephric fluid collection.  A percutaneous drain was placed, and she presents now for a J2 stent to assist with closure of a calyceal leak.  Past Medical History  Diagnosis Date  . Polio   . 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 H/O steroid therapy     Steroid use -last used 1 week ago  . Pain at rest     abdominal pain, right side- intermittent"dx. right renal mass"  . Headache     migraines-have occurred less since retired '99.  . Neuromuscular disorder     polio- no mobility lower extremities bilaterally  . Hypertension     Past Surgical History  Procedure Laterality Date  . Abdominal hysterectomy    . Leg tendon surgery      age 66 or 69 yrs old    Home Medications:  No prescriptions prior to admission    Allergies: No Known Allergies  Family History  Problem Relation Age of Onset  . Heart disease Brother   . Prostate cancer Brother     Social History:  reports that she has never smoked. She does not have any smokeless tobacco history on file. She reports that she drinks alcohol. She reports that she does not use illicit drugs.  ROS: A complete review of systems was performed.  All systems are negative except for pertinent findings as noted.  Physical Exam:  Vital signs in last 24 hours:   General:  Alert and oriented, No acute distress HEENT: Normocephalic, atraumatic Neck: No JVD or lymphadenopathy Cardiovascular: Regular rate and rhythm Lungs: Clear bilaterally Abdomen: Soft, nontender, nondistended, no abdominal  masses. Drain in right flank area Back: No CVA tenderness Extremities: No edema Neurologic: Bilateral LE weakness  Laboratory Data:  No results found for this or any previous visit (from the past 24 hour(s)). Recent Results (from the past 240 hour(s))  Culture, routine-abscess     Status: None   Collection Time: 04/23/14  4:15 PM  Result Value Ref Range Status   Specimen Description ABSCESS  Final   Special Requests NONE  Final   Gram Stain   Final    ABUNDANT WBC PRESENT,BOTH PMN AND MONONUCLEAR NO SQUAMOUS EPITHELIAL CELLS SEEN NO ORGANISMS SEEN Performed at [facility]    Culture   Final    NO GROWTH 3 DAYS Performed at [facility]    Report Status 04/27/2014 FINAL  Final  Urine culture     Status: None   Collection Time: 04/24/14 12:38 PM  Result Value Ref Range Status   Specimen Description URINE, CLEAN CATCH  Final   Special Requests NONE  Final   Colony Count NO GROWTH Performed at [facility]   Final   Culture NO GROWTH Performed at [facility]   Final   Report Status 04/26/2014 FINAL  Final   Creatinine:  Recent Labs  04/23/14 0901 04/23/14 1344 04/24/14 0442 04/25/14 0522  CREATININE 0.90 0.95 0.58 0.47*    Radiologic Imaging: No results found.  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 Georgianne Gritz MD

## 2014-04-28 NOTE — Progress Notes (Signed)
Doctor Dahlstedt paged no answer.spoke with Glenard Haring and left message with Foster Simpson need orders signe and patient desires to speak with doctor prior to signing consent.

## 2014-04-28 NOTE — Anesthesia Preprocedure Evaluation (Addendum)
Anesthesia Evaluation  Patient identified by MRN, date of birth, ID band Patient awake    Reviewed: Allergy & Precautions, NPO status , Patient's Chart, lab work & pertinent test results  Airway Mallampati: II  TM Distance: >3 FB Neck ROM: Full    Dental no notable dental hx.    Pulmonary neg pulmonary ROS,  breath sounds clear to auscultation  Pulmonary exam normal       Cardiovascular hypertension, Rhythm:Regular Rate:Normal     Neuro/Psych  Headaches,  Neuromuscular disease negative psych ROS   GI/Hepatic negative GI ROS, Neg liver ROS,   Endo/Other  negative endocrine ROS  Renal/GU Renal disease  negative genitourinary   Musculoskeletal negative musculoskeletal ROS (+)   Abdominal   Peds negative pediatric ROS (+)  Hematology  (+) anemia ,   Anesthesia Other Findings Hx polio Paraplegia  Reproductive/Obstetrics negative OB ROS                            Anesthesia Physical  Anesthesia Plan  ASA: II  Anesthesia Plan: General   Post-op Pain Management:    Induction: Intravenous  Airway Management Planned: LMA  Additional Equipment:   Intra-op Plan:   Post-operative Plan:   Informed Consent: I have reviewed the patients History and Physical, chart, labs and discussed the procedure including the risks, benefits and alternatives for the proposed anesthesia with the patient or authorized representative who has indicated his/her understanding and acceptance.   Dental advisory given  Plan Discussed with: CRNA, Anesthesiologist and Surgeon  Anesthesia Plan Comments:        Anesthesia Quick Evaluation

## 2014-04-28 NOTE — Discharge Instructions (Signed)

## 2014-04-28 NOTE — Anesthesia Procedure Notes (Signed)
Procedure Name: LMA Insertion Date/Time: 04/28/2014 12:24 PM Performed by: Glory Buff Pre-anesthesia Checklist: Patient identified, Emergency Drugs available, Suction available, Patient being monitored and Timeout performed Patient Re-evaluated:Patient Re-evaluated prior to inductionOxygen Delivery Method: Circle system utilized Preoxygenation: Pre-oxygenation with 100% oxygen Intubation Type: IV induction Ventilation: Mask ventilation without difficulty LMA: LMA inserted LMA Size: 4.0 Number of attempts: 1 Placement Confirmation: positive ETCO2 Tube secured with: Tape Dental Injury: Teeth and Oropharynx as per pre-operative assessment

## 2014-04-28 NOTE — Transfer of Care (Signed)
Immediate Anesthesia Transfer of Care Note  Patient: Marie Thomas  Procedure(s) Performed: Procedure(s): CYSTOSCOPY WITH RIGHT RETROGRADE AND STENT PLACEMENT (Right)  Patient Location: PACU  Anesthesia Type:General  Level of Consciousness: awake, alert  and oriented  Airway & Oxygen Therapy: Patient Spontanous Breathing and Patient connected to face mask oxygen  Post-op Assessment: Report given to RN and Post -op Vital signs reviewed and stable  Post vital signs: Reviewed and stable  Last Vitals:  Filed Vitals:   04/28/14 0857  BP: 144/77  Pulse: 113  Temp: 36.4 C  Resp: 18    Complications: No apparent anesthesia complications

## 2014-04-28 NOTE — Anesthesia Postprocedure Evaluation (Signed)
  Anesthesia Post-op Note  Patient: Marie Thomas  Procedure(s) Performed: Procedure(s) (LRB): CYSTOSCOPY WITH RIGHT RETROGRADE AND STENT PLACEMENT (Right)  Patient Location: PACU  Anesthesia Type: General  Level of Consciousness: awake and alert   Airway and Oxygen Therapy: Patient Spontanous Breathing  Post-op Pain: mild  Post-op Assessment: Post-op Vital signs reviewed, Patient's Cardiovascular Status Stable, Respiratory Function Stable, Patent Airway and No signs of Nausea or vomiting  Last Vitals:  Filed Vitals:   04/28/14 1435  BP: 147/49  Pulse: 112  Temp:   Resp: 14    Post-op Vital Signs: stable   Complications: No apparent anesthesia complications

## 2014-04-28 NOTE — Op Note (Addendum)
PATIENT:  Marie Thomas DIAGNOSIS: Urinoma with calyceal urine leak   POST-OPERATIVE DIAGNOSIS: Same  PROCEDURE: Cystoscopy, right retrograde pyelogram, right J2 stent placement  SURGEON:  Lillette Boxer. Indalecio Malmstrom, M.D.  ANESTHESIA:  General  EBL:  Minimal  DRAINS: 24 cm by 6 Fr contour stent without string  LOCAL MEDICATIONS USED:  None  SPECIMEN:  N/A  INDICATION: Marie Thomas is a 69 year old female with a urinoma around her right kidney, secondary to cryoablation of a right renal mass approximately 2-1/2 months ago. This was appropriately drained percutaneously last week. She still has significant drainage, and at this point cystoscopy and stent placement are indicated to help decrease the drainage of that right urinoma. The patient is been instructed in the procedure as well as risks and complications and desires to proceed   Description of procedure: The patient was properly identified and marked (if applicable) in the holding area. They were then  taken to the operating room and placed on the table in a supine position. General anesthesia was then administered. Once fully anesthetized the patient was moved to the dorsolithotomy position and the genitalia and perineum were sterilely prepped and draped in standard fashion. An official timeout was then performed.    A 22 French panendoscope was passed into her bladder. The bladder was inspected circumferentially. All aspects of the bladder were normal. Ureteral orifices were normal in configuration and location. At this point, the right ureteral orifice was cannulated with a 6 [place] open-ended catheter. Retrograde ureteropyelogram was performed using Omnipaque.  This revealed a normal ureter. Renal pelvis was normal. Calyceal system was normal except for what seem to be a calyceal leak associated with an upper pole calyx with a cavity, slightly irregular in contour, above this. This easily filled with  contrast.  Following this, through the open-ended catheter, I passed a  0.038 inch sensor-tip guidewire, and using fluoroscopy negotiated this into the renal pelvis. Over top of this, a 24 cm x 6 French contour double-J stent without tether was placed. Once the guidewire was removed, excellent proximal and distal curls were seen using fluoroscopic and cystoscopic guidance.  At this point, the bladder was drained and the procedure terminated with the scope being removed. She was awakened and taken to PACU. She tolerated the procedure well.    PLAN OF CARE: Discharge to home after PACU  PATIENT DISPOSITION:  PACU - hemodynamically stable.

## 2014-04-28 NOTE — Progress Notes (Signed)
Marie Thomas  In [place] aware doctor needs to sign orders and speak with patient and get consent form signed.

## 2014-04-29 ENCOUNTER — Encounter (HOSPITAL_COMMUNITY): Payer: Self-pay | Admitting: Urology

## 2014-04-29 ENCOUNTER — Other Ambulatory Visit (HOSPITAL_COMMUNITY): Payer: Self-pay | Admitting: Interventional Radiology

## 2014-04-29 DIAGNOSIS — IMO0002 Reserved for concepts with insufficient information to code with codable children: Secondary | ICD-10-CM

## 2014-04-29 DIAGNOSIS — D3001 Benign neoplasm of right kidney: Secondary | ICD-10-CM

## 2014-04-30 ENCOUNTER — Other Ambulatory Visit: Payer: Self-pay | Admitting: *Deleted

## 2014-04-30 DIAGNOSIS — C641 Malignant neoplasm of right kidney, except renal pelvis: Secondary | ICD-10-CM

## 2014-05-08 ENCOUNTER — Ambulatory Visit (HOSPITAL_COMMUNITY): Payer: PPO

## 2014-05-08 ENCOUNTER — Other Ambulatory Visit: Payer: PPO

## 2014-05-19 ENCOUNTER — Other Ambulatory Visit (HOSPITAL_COMMUNITY): Payer: Self-pay | Admitting: Interventional Radiology

## 2014-05-19 DIAGNOSIS — IMO0002 Reserved for concepts with insufficient information to code with codable children: Secondary | ICD-10-CM

## 2014-05-23 ENCOUNTER — Encounter: Payer: Self-pay | Admitting: Hematology & Oncology

## 2014-05-23 ENCOUNTER — Ambulatory Visit (HOSPITAL_BASED_OUTPATIENT_CLINIC_OR_DEPARTMENT_OTHER): Payer: PPO | Admitting: Hematology & Oncology

## 2014-05-23 ENCOUNTER — Other Ambulatory Visit (HOSPITAL_BASED_OUTPATIENT_CLINIC_OR_DEPARTMENT_OTHER): Payer: PPO

## 2014-05-23 VITALS — BP 150/80 | HR 116 | Temp 98.4°F | Resp 16 | Ht 60.0 in | Wt 116.0 lb

## 2014-05-23 DIAGNOSIS — D3001 Benign neoplasm of right kidney: Secondary | ICD-10-CM

## 2014-05-23 DIAGNOSIS — N2889 Other specified disorders of kidney and ureter: Secondary | ICD-10-CM

## 2014-05-23 LAB — CBC WITH DIFFERENTIAL (CANCER CENTER ONLY)
BASO#: 0 10*3/uL (ref 0.0–0.2)
BASO%: 0.3 % (ref 0.0–2.0)
EOS%: 1.4 % (ref 0.0–7.0)
Eosinophils Absolute: 0.1 10*3/uL (ref 0.0–0.5)
HCT: 39.1 % (ref 34.8–46.6)
HEMOGLOBIN: 12.3 g/dL (ref 11.6–15.9)
LYMPH#: 1.4 10*3/uL (ref 0.9–3.3)
LYMPH%: 21.5 % (ref 14.0–48.0)
MCH: 26.8 pg (ref 26.0–34.0)
MCHC: 31.5 g/dL — AB (ref 32.0–36.0)
MCV: 85 fL (ref 81–101)
MONO#: 0.8 10*3/uL (ref 0.1–0.9)
MONO%: 12.1 % (ref 0.0–13.0)
NEUT#: 4.1 10*3/uL (ref 1.5–6.5)
NEUT%: 64.7 % (ref 39.6–80.0)
PLATELETS: 272 10*3/uL (ref 145–400)
RBC: 4.59 10*6/uL (ref 3.70–5.32)
RDW: 20.5 % — ABNORMAL HIGH (ref 11.1–15.7)
WBC: 6.4 10*3/uL (ref 3.9–10.0)

## 2014-05-23 NOTE — Progress Notes (Signed)
Hematology and Oncology Follow Up Visit  JEANEEN CALA [id number] May 16, 1945 69 y.o. 05/23/2014   Principle Diagnosis:   Angiomyolipoma of the right kidney  Current Therapy:    Status post RFA     Interim History:  Ms. Royal is back for follow-up. Shockingly enough, she did not have a malignancy in the right kidney. She did have the RFA done area and the pathology report however showed that she had a angiomyolipoma.  Unfortunately, she had, occasions from the procedure. She had a ureter that was involved. She had leakage. She had be admitted. She was "septic". She was on antibiotics. Cultures were all negative. Patient now has a percutaneous nephrostomy tube in on the right side. She has a Foley catheter in. Urology and interventional radiology are helping her out.  Since there is no obvious malignancy, I am grateful that there is nothing that we have to do for her.  She has polio. She has braces on her legs. She is trying to maintain her independence but his stuff when she has these ostomy bags on.  She's had no recent fever. She's had a decent appetite. She's had no nausea vomiting. Has been no cough. She's had no bleeding.  Medications:  Current outpatient prescriptions:  .  acetaminophen (TYLENOL) 500 MG tablet, Take 500 mg by mouth as needed for mild pain. , Disp: , Rfl:  .  alendronate (FOSAMAX) 70 MG tablet, Take 70 mg by mouth once a week. On Thursday, Disp: , Rfl:  .  Cholecalciferol (VITAMIN D) 2000 UNITS tablet, Take 2,000 Units by mouth daily., Disp: , Rfl:  .  docusate sodium (COLACE) 100 MG capsule, Take 1 capsule (100 mg total) by mouth every 12 (twelve) hours. (Patient taking differently: Take 100 mg by mouth as needed. ), Disp: 60 capsule, Rfl: 0 .  feeding supplement, ENSURE ENLIVE, (ENSURE ENLIVE) LIQD, Take 237 mLs by mouth 2 (two) times daily between meals., Disp: , Rfl:  .  HYDROcodone-acetaminophen (NORCO/VICODIN) 5-325 MG per tablet, Take 1-2 tablets by  mouth every 6 (six) hours as needed for moderate pain or severe pain., Disp: 20 tablet, Rfl: 0  Allergies: No Known Allergies  Past Medical History, Surgical history, Social history, and Family History were reviewed and updated.  Review of Systems: As above  Physical Exam:  height is 5' (1.524 m) and weight is 116 lb (52.617 kg). Her oral temperature is 98.4 F (36.9 C). Her blood pressure is 150/80 and her pulse is 116. Her respiration is 16.   Wt Readings from Last 3 Encounters:  05/23/14 116 lb (52.617 kg)  04/28/14 128 lb (58.06 kg)  04/25/14 128 lb 15.5 oz (58.5 kg)     Somewhat elderly appearing white female. She has braces on her legs from polio. Head and neck exam shows no ocular or oral lesions. She has no palpable cervical or supraclavicular lymph nodes. Lungs are clear. Cardiac exam tachycardic rate that is regular. There are no murmurs, rubs or bruits. Abdomen is soft. She has good bowel sounds. There is no fluid wave. There is no palpable abdominal mass. She has the nephrostomy tube over on the right side. Extremities shows braces on her legs. She has muscle atrophy in her legs. Arms are with minimal atrophy. She has good range of motion of her arms. Skin exam shows no rashes. Neurological exam is nonfocal.  Lab Results  Component Value Date   WBC 6.4 05/23/2014   HGB 12.3 05/23/2014   HCT 39.1 05/23/2014  MCV 85 05/23/2014   PLT 272 05/23/2014     Chemistry      Component Value Date/Time   NA 143 04/25/2014 0522   K 4.1 04/25/2014 0522   CL 108 04/25/2014 0522   CO2 29 04/25/2014 0522   BUN 16 04/25/2014 0522   CREATININE 0.47* 04/25/2014 0522   CREATININE 0.49* 03/17/2014 1100      Component Value Date/Time   CALCIUM 9.0 04/25/2014 0522   ALKPHOS 109 04/23/2014 1344   AST 23 04/23/2014 1344   ALT 22 04/23/2014 1344   BILITOT 0.8 04/23/2014 1344         Impression and Plan: Ms. Handley is 69 year old white female. She presented here with a renal  mass. It was felt that this was highly likely to be malignant. She underwent RFA. Unfortunate lady, she had complications from this. She is gradually improving.  I am grateful that she does not have a malignancy. I think this is wonderful.  She recently had a CT scan done by Dr. Diona Fanti. We're trying to get the results from his office.  For now, I really don't think we have to get her back to the office. I does don't see that we have to be involved with her medical care since there is no malignancy.  She comes in with her  2 daughters. As always, I have a good time talking with them       . Volanda Napoleon, MD 5/6/20161:15 PM

## 2014-05-24 LAB — PREALBUMIN: Prealbumin: 15 mg/dL — ABNORMAL LOW (ref 17–34)

## 2014-05-24 LAB — LACTATE DEHYDROGENASE: LDH: 128 U/L (ref 94–250)

## 2014-05-26 ENCOUNTER — Ambulatory Visit: Payer: PPO | Admitting: Hematology & Oncology

## 2014-05-27 ENCOUNTER — Ambulatory Visit (HOSPITAL_COMMUNITY): Payer: PPO

## 2014-05-27 ENCOUNTER — Ambulatory Visit
Admission: RE | Admit: 2014-05-27 | Discharge: 2014-05-27 | Disposition: A | Discharge status: Home / Self Care | Payer: PPO | Source: Ambulatory Visit | Attending: Interventional Radiology | Admitting: Interventional Radiology

## 2014-05-27 DIAGNOSIS — D3001 Benign neoplasm of right kidney: Secondary | ICD-10-CM

## 2014-05-27 NOTE — Progress Notes (Signed)
Chief Complaint: Chief Complaint  Patient presents with  . Follow-up    3.5 mo follow up Cryoablation of Right Renal Mass      Referring Physician(s): McCullough,Heath  History of Present Illness: Marie Thomas is a 69 y.o. female with an enhancing right renal mass highly suspicious for renal neoplasm. She underwent simultaneous biopsy and percutaneous cryoablation which was performed on 02/07/2014.  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 Ultimately, this lesion would have required treatment as angiomyolipoma is over 4 cm carry an increased risk of spontaneous and potentially life-threatening hemorrhage secondary to aneurysm formation within the intra-lesional blood vessels.   Her immediate post procedural course was complicated acute onset of severe flank pain. CT imaging at that time demonstrated an intermediate attenuation fluid collection at the ablation site with no evidence of calyceal leak. She had a mild leukocytosis but was otherwise afebrile. Given the intermediate attenuation of the fluid by CT imaging in the acute onset, this was and still is felt to be most consistent with a delayed postoperative hemorrhage. This also seems likely given the fact that her lesion was and angiomyolipoma which are known to have abnormal and aneurysmal arterial supply.   Follow-up clinical evaluation at 1 month (03/20/14) demonstrated some improvement in her symptoms and resolution of her leukocytosis. No imaging was performed at that time.  Unfortunately, in the days and weeks following her last clinic visit she developed recurrent progressive right flank pain, fever and chills.  She returned to clinic one month later on 04/23/2014 and was seen by my partner, Dr. Annamaria Boots. Due to her persistent pain and low-grade fever, repeat CT scan was performed which diagnosed a urinoma and defined a calyceal leak at the site of the prior  hematoma.  The patient was admitted to the hospital and the perinephric urinoma was percutaneously drained. The patient was begun on antibiotics. Urology (Dr. Diona Fanti) was consult. Due to persistent high output from the perinephric urinoma drain, a double-J ureteral stent was ultimately placed on 04/28/14.   To facilitate complete decompression of the renal collecting system, a Foley catheter was also placed several weeks later.  Follow-up CT imaging performed last week demonstrated significant interval duction in the perinephric urinoma and a persistent but improving calyceal leak. The percutaneous perinephric urinoma drain and double-J ureteral stents are well positioned.  Marie Thomas presents today for follow-up evaluation. The above course of events and associated imaging was reviewed in detail with her and her family. Today, she is feeling significantly better following the interventions she has undergone so far. Her main complaint is the difficulty in dealing with the Foley catheter and percutaneous perinephric drainage catheter given her disability and requirement for transfers to and from her wheelchair.  Otherwise, her pain is resolved as has her fever and chills.  She denies new systemic symptoms or complaint. She is anxious to continue the healing process and ultimately remove her percutaneous drain, Foley catheter and double-J stents.    Past Medical History  Diagnosis Date  . Polio   . 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 H/O steroid therapy     Steroid use -last used 1 week ago  . Pain at rest     abdominal pain, right side- intermittent"dx. right renal mass"  . Headache     migraines-have occurred less since retired '99.  Marland Kitchen  Neuromuscular disorder     polio- no mobility lower extremities bilaterally  . Hypertension     Past Surgical History  Procedure Laterality Date  .  Abdominal hysterectomy    . Leg tendon surgery      age 41 or 69 yrs old  . Cystoscopy with stent placement Right 04/28/2014    Procedure: CYSTOSCOPY WITH RIGHT RETROGRADE AND STENT PLACEMENT;  Surgeon: Franchot Gallo, MD;  Location: [facility];  Service: Urology;  Laterality: Right;    Allergies: Review of patient's allergies indicates no known allergies.  Medications: Prior to Admission medications   Medication Sig Start Date End Date Taking? Authorizing Provider  acetaminophen (TYLENOL) 500 MG tablet Take 500 mg by mouth as needed for mild pain.    Yes Historical Provider, MD  alendronate (FOSAMAX) 70 MG tablet Take 70 mg by mouth once a week. On Thursday 08/22/13  Yes Historical Provider, MD  Cholecalciferol (VITAMIN D) 2000 UNITS tablet Take 2,000 Units by mouth daily.   Yes Historical Provider, MD  docusate sodium (COLACE) 100 MG capsule Take 1 capsule (100 mg total) by mouth every 12 (twelve) hours. Patient taking differently: Take 100 mg by mouth as needed.  11/13/13  Yes Sherwood Gambler, MD  feeding supplement, ENSURE ENLIVE, (ENSURE ENLIVE) LIQD Take 237 mLs by mouth 2 (two) times daily between meals. 04/25/14  Yes Modena Jansky, MD  HYDROcodone-acetaminophen (NORCO/VICODIN) 5-325 MG per tablet Take 1-2 tablets by mouth every 6 (six) hours as needed for moderate pain or severe pain. 04/25/14  Yes Modena Jansky, MD  sulfamethoxazole-trimethoprim (BACTRIM DS,SEPTRA DS) 800-160 MG per tablet Take 1 tablet by mouth 2 (two) times daily.   Yes Historical Provider, MD     Family History  Problem Relation Age of Onset  . Heart disease Brother   . Prostate cancer Brother     History   Social History  . Marital Status: Single    Spouse Name: N/A  . Number of Children: N/A  . Years of Education: N/A   Social History Main Topics  . Smoking status: Never Smoker   . Smokeless tobacco: Never Used     Comment: NEVER USED TOBACCO  . Alcohol Use: 0.0 oz/week    0 Standard drinks or  equivalent per week     Comment: social  . Drug Use: No  . Sexual Activity: Not Currently   Other Topics Concern  . Not on file   Social History Narrative    Review of Systems: A 12 point ROS discussed and pertinent positives are indicated in the HPI above.  All other systems are negative.  Review of Systems  Vital Signs: BP 163/62 mmHg  Pulse 119  Temp(Src) 97.6 F (36.4 C) (Oral)  Resp 12  SpO2 99%  Physical Exam  Constitutional: She is oriented to person, place, and time. She appears well-nourished. No distress.  HENT:  Head: Normocephalic and atraumatic.  Eyes: No scleral icterus.  Pulmonary/Chest: Effort normal.  Abdominal: Soft. There is no tenderness.  Right flank perinephric drain site evaluated.  Mild erythema of the skin at the retaining suture.  No fluctuance, induration or drainage.  Bandage is clean and dry.   Neurological: She is alert and oriented to person, place, and time.  Skin: Skin is warm and dry.  Psychiatric: She has a normal mood and affect. Her behavior is normal.  Nursing note and vitals reviewed.    Imaging: Dg Retrograde Pyelogram  04/28/2014   CLINICAL DATA:  69 year old  female with a history of retrograde pyelogram.  Patient has a history of cryoablation of a renal cell carcinoma with development of urinoma.  EXAM: RETROGRADE PYELOGRAM  COMPARISON:  CT 04/23/2014, 02/07/2014  FINDINGS: Multiple intraoperative fluoroscopic spot images.  Images demonstrate cannulation of the right ureter with retrograde infusion of contrast.  Partial opacification of the right ureter and right collecting system.  Images demonstrate a right-sided perinephric drain, with the radiopaque marker just lateral to the renal pelvis.  Incomplete opacification the collecting system with no evidence of hydronephrosis.  There is poorly defined contrast extending superior to the upper pole of the collecting system, potentially continuous with the urinoma.  IMPRESSION:  Intraoperative right-sided retrograde pyelogram demonstrates unremarkable caliber of the right ureter, with no evidence of hydronephrosis.  Ill-defined contrast collection superior to the collecting system likely continuous with urinoma.  Percutaneous surgical drain overlying the right collecting system.  Please refer to the dictated operative report for full details of intraoperative findings and procedure.  Signed,  Dulcy Fanny. Earleen Newport, DO  [facility]  [facility]   Electronically Signed   By: Corrie Mckusick D.O.   On: 04/28/2014 15:02    Labs:  CBC:  Recent Labs  04/23/14 1344 04/24/14 0442 04/25/14 0522 05/23/14 1127  WBC 17.4* 11.5* 7.2 6.4  HGB 11.8* 9.9* 9.9* 12.3  HCT 37.1 31.4* 32.6* 39.1  PLT 273 233 228 272    COAGS:  Recent Labs  12/05/13 0904 02/04/14 1525 04/23/14 1344  INR 1.02 0.94 1.13  APTT 24 28 25     BMP:  Recent Labs  02/08/14 0433  03/17/14 1100 04/23/14 0901 04/23/14 1344 04/24/14 0442 04/25/14 0522  NA 139  < > 139 135 137 137 143  K 4.8  < > 4.0 3.5 3.6 2.8* 4.1  CL 103  < > 96 96 97 101 108  CO2 30  < > 27  --  27 27 29   GLUCOSE 127*  < > 83 112* 105* 119* 153*  BUN 16  < > 11 25* 26* 19 16  CALCIUM 8.7  < > 9.1  --  9.3 8.2* 9.0  CREATININE 0.42*  < > 0.49* 0.90 0.95 0.58 0.47*  GFRNONAA >90  --   --   --  60* >90 >90  GFRAA >90  --   --   --  70* >90 >90  < > = values in this interval not displayed.  LIVER FUNCTION TESTS:  Recent Labs  11/13/13 0337 04/23/14 1344  BILITOT 0.3 0.8  AST 21 23  ALT 19 22  ALKPHOS 52 109  PROT 7.4 7.2  ALBUMIN 4.4 3.2*    Assessment and Plan:  69 year old female recovering from delayed postoperative complications following percutaneous cryoablation of a lipid poor renal angiomyolipoma.  She initially experienced delayed hemorrhage at the ablation site approximately 1 week post ablation. It appears the hematoma caused a disruption between the renal  collecting system and ablation site as she subsequently developed a urine leak and urinoma which ultimately became infected requiring percutaneous drain placement and urinary diversion with an internal double-J ureteral stent and chronic Foley catheter.  I spent a long time going through the progression of Marie Thomas scans and clinical course with her and her family to explain her very complicated situation. Time was given for questions and all questions were answered to her satisfaction.  She understands that the urine leak is likely a secondary result of her delayed post ablation hemorrhage  and that it is being actively managed with great help from our colleague Dr. Diona Fanti of urology.  I greatly appreciate his expertise and assistance with urinary diversion.  1.) Repeat CT scan of the abdomen and pelvis at the end of the month as planned (already ordered by Dr. Diona Fanti). - If there is no further evidence of contrast extravasation from the renal collecting system into the perinephric urinoma, consider retrograde ureteropyelogram to confirm healing of the defect in the renal collecting system. - Once the defect in the renal collecting system has resolved, the double-J ureteral stent and perinephric drainage catheter can likely be removed. - If the defect persists in the double-J ureteral stent and perinephric drainage catheter must remain in place, patient should undergo routine drainage catheter exchange for a fresh tube - I will contact and coordinate with Dr. Diona Fanti  2.)  Tentative follow-up clinic visit in 6 weeks with CBC and BMP.   - We will ultimately resume surveillance imaging to ensure complete ablation of the AML at approximately 6 months post ablation.  SignedJacqulynn Cadet 05/27/2014, 6:21 PM   I spent a total of  25 Minutes in face to face in clinical consultation, greater than 50% of which was counseling/coordinating care for post operative complications including  urinoma.

## 2014-06-04 ENCOUNTER — Other Ambulatory Visit (HOSPITAL_COMMUNITY): Payer: Self-pay | Admitting: Interventional Radiology

## 2014-06-04 ENCOUNTER — Ambulatory Visit (HOSPITAL_COMMUNITY)
Admission: RE | Admit: 2014-06-04 | Discharge: 2014-06-04 | Disposition: A | Discharge status: Home / Self Care | Payer: PPO | Source: Ambulatory Visit | Attending: Interventional Radiology | Admitting: Interventional Radiology

## 2014-06-04 ENCOUNTER — Ambulatory Visit: Admission: RE | Admit: 2014-06-04 | Payer: PPO | Source: Ambulatory Visit

## 2014-06-04 ENCOUNTER — Other Ambulatory Visit: Payer: Self-pay | Admitting: Urology

## 2014-06-04 ENCOUNTER — Other Ambulatory Visit: Payer: Self-pay | Admitting: *Deleted

## 2014-06-04 DIAGNOSIS — K5730 Diverticulosis of large intestine without perforation or abscess without bleeding: Secondary | ICD-10-CM | POA: Diagnosis not present

## 2014-06-04 DIAGNOSIS — K7689 Other specified diseases of liver: Secondary | ICD-10-CM | POA: Diagnosis not present

## 2014-06-04 DIAGNOSIS — D3001 Benign neoplasm of right kidney: Secondary | ICD-10-CM

## 2014-06-04 DIAGNOSIS — D1771 Benign lipomatous neoplasm of kidney: Secondary | ICD-10-CM | POA: Diagnosis not present

## 2014-06-04 DIAGNOSIS — N2889 Other specified disorders of kidney and ureter: Secondary | ICD-10-CM

## 2014-06-04 DIAGNOSIS — Z9889 Other specified postprocedural states: Secondary | ICD-10-CM | POA: Diagnosis not present

## 2014-06-04 DIAGNOSIS — R109 Unspecified abdominal pain: Secondary | ICD-10-CM | POA: Insufficient documentation

## 2014-06-04 DIAGNOSIS — J479 Bronchiectasis, uncomplicated: Secondary | ICD-10-CM | POA: Insufficient documentation

## 2014-06-04 DIAGNOSIS — I700 Atherosclerosis of aorta: Secondary | ICD-10-CM | POA: Insufficient documentation

## 2014-06-04 DIAGNOSIS — R52 Pain, unspecified: Secondary | ICD-10-CM

## 2014-06-04 LAB — BASIC METABOLIC PANEL
ANION GAP: 10 (ref 5–15)
BUN: 13 mg/dL (ref 6–20)
CHLORIDE: 102 mmol/L (ref 101–111)
CO2: 27 mmol/L (ref 22–32)
Calcium: 9.1 mg/dL (ref 8.9–10.3)
Creatinine, Ser: 0.46 mg/dL (ref 0.44–1.00)
GFR calc Af Amer: >60 mL/min (ref >60)
GFR calc non Af Amer: >60 mL/min (ref >60)
GLUCOSE: 102 mg/dL — AB (ref 65–99)
POTASSIUM: 4.2 mmol/L (ref 3.5–5.1)
SODIUM: 139 mmol/L (ref 135–145)

## 2014-06-04 LAB — CBC
HEMATOCRIT: 38.7 % (ref 36.0–46.0)
Hemoglobin: 12.2 g/dL (ref 12.0–15.0)
MCH: 26.4 pg (ref 26.0–34.0)
MCHC: 31.5 g/dL (ref 30.0–36.0)
MCV: 83.8 fL (ref 78.0–100.0)
Platelets: 306 10*3/uL (ref 150–400)
RBC: 4.62 MIL/uL (ref 3.87–5.11)
RDW: 19.2 % — AB (ref 11.5–15.5)
WBC: 6.2 10*3/uL (ref 4.0–10.5)

## 2014-06-04 MED ORDER — IOHEXOL 300 MG/ML  SOLN
100.0000 mL | Freq: Once | INTRAMUSCULAR | Status: AC | PRN
  Administered 2014-06-04: 100 mL via INTRAVENOUS

## 2014-06-05 ENCOUNTER — Telehealth: Payer: Self-pay | Admitting: *Deleted

## 2014-06-05 ENCOUNTER — Encounter (HOSPITAL_COMMUNITY)
Admission: RE | Disposition: A | Discharge status: Home / Self Care | Payer: Self-pay | Source: Ambulatory Visit | Attending: Urology

## 2014-06-05 ENCOUNTER — Encounter (HOSPITAL_COMMUNITY): Payer: Self-pay | Admitting: *Deleted

## 2014-06-05 ENCOUNTER — Ambulatory Visit (HOSPITAL_COMMUNITY): Payer: PPO | Admitting: Anesthesiology

## 2014-06-05 ENCOUNTER — Ambulatory Visit (HOSPITAL_COMMUNITY)
Admission: RE | Admit: 2014-06-05 | Discharge: 2014-06-05 | Disposition: A | Discharge status: Home / Self Care | Payer: PPO | Source: Ambulatory Visit | Attending: Urology | Admitting: Urology

## 2014-06-05 DIAGNOSIS — G8220 Paraplegia, unspecified: Secondary | ICD-10-CM | POA: Insufficient documentation

## 2014-06-05 DIAGNOSIS — G43909 Migraine, unspecified, not intractable, without status migrainosus: Secondary | ICD-10-CM | POA: Diagnosis not present

## 2014-06-05 DIAGNOSIS — Z79899 Other long term (current) drug therapy: Secondary | ICD-10-CM | POA: Insufficient documentation

## 2014-06-05 DIAGNOSIS — I10 Essential (primary) hypertension: Secondary | ICD-10-CM | POA: Diagnosis not present

## 2014-06-05 DIAGNOSIS — Z8612 Personal history of poliomyelitis: Secondary | ICD-10-CM | POA: Diagnosis not present

## 2014-06-05 DIAGNOSIS — N368 Other specified disorders of urethra: Secondary | ICD-10-CM | POA: Insufficient documentation

## 2014-06-05 DIAGNOSIS — IMO0002 Reserved for concepts with insufficient information to code with codable children: Secondary | ICD-10-CM

## 2014-06-05 HISTORY — PX: CYSTOSCOPY W/ URETERAL STENT PLACEMENT: SHX1429

## 2014-06-05 SURGERY — CYSTOSCOPY, FLEXIBLE, WITH STENT REPLACEMENT
Anesthesia: General | Site: Ureter | Laterality: Right

## 2014-06-05 MED ORDER — PROPOFOL 10 MG/ML IV BOLUS
INTRAVENOUS | Status: DC | PRN
  Administered 2014-06-05: 200 mg via INTRAVENOUS

## 2014-06-05 MED ORDER — ONDANSETRON HCL 4 MG/2ML IJ SOLN
INTRAMUSCULAR | Status: DC | PRN
  Administered 2014-06-05: 4 mg via INTRAVENOUS

## 2014-06-05 MED ORDER — LIDOCAINE HCL (CARDIAC) 20 MG/ML IV SOLN
INTRAVENOUS | Status: DC | PRN
  Administered 2014-06-05: 50 mg via INTRAVENOUS

## 2014-06-05 MED ORDER — FENTANYL CITRATE (PF) 100 MCG/2ML IJ SOLN
INTRAMUSCULAR | Status: DC | PRN
  Administered 2014-06-05 (×2): 50 ug via INTRAVENOUS

## 2014-06-05 MED ORDER — LIDOCAINE HCL 1 % IJ SOLN
INTRAMUSCULAR | Status: AC
  Filled 2014-06-05: qty 20

## 2014-06-05 MED ORDER — CEFAZOLIN SODIUM-DEXTROSE 2-3 GM-% IV SOLR
INTRAVENOUS | Status: AC
  Filled 2014-06-05: qty 50

## 2014-06-05 MED ORDER — LIDOCAINE HCL (CARDIAC) 20 MG/ML IV SOLN
INTRAVENOUS | Status: AC
  Filled 2014-06-05: qty 10

## 2014-06-05 MED ORDER — MIDAZOLAM HCL 2 MG/2ML IJ SOLN
INTRAMUSCULAR | Status: AC
  Filled 2014-06-05: qty 2

## 2014-06-05 MED ORDER — CEFAZOLIN SODIUM-DEXTROSE 2-3 GM-% IV SOLR
INTRAVENOUS | Status: DC | PRN
  Administered 2014-06-05: 2 g via INTRAVENOUS

## 2014-06-05 MED ORDER — LIDOCAINE HCL 2 % EX GEL
CUTANEOUS | Status: AC
  Filled 2014-06-05: qty 10

## 2014-06-05 MED ORDER — CEFAZOLIN SODIUM-DEXTROSE 2-3 GM-% IV SOLR
2.0000 g | Freq: Once | INTRAVENOUS | Status: DC
Start: 2014-06-05 — End: 2014-06-05

## 2014-06-05 MED ORDER — IOHEXOL 300 MG/ML  SOLN
INTRAMUSCULAR | Status: DC | PRN
  Administered 2014-06-05: 10 mL via INTRAVENOUS

## 2014-06-05 MED ORDER — PROPOFOL 10 MG/ML IV BOLUS
INTRAVENOUS | Status: AC
  Filled 2014-06-05: qty 20

## 2014-06-05 MED ORDER — FENTANYL CITRATE (PF) 100 MCG/2ML IJ SOLN
INTRAMUSCULAR | Status: AC
  Filled 2014-06-05: qty 2

## 2014-06-05 MED ORDER — PROMETHAZINE HCL 25 MG/ML IJ SOLN: 6.2500 mg | INTRAMUSCULAR | Status: DC | PRN

## 2014-06-05 MED ORDER — HYDROCODONE-ACETAMINOPHEN 5-325 MG PO TABS: 1.0000 | ORAL_TABLET | ORAL | Status: DC | PRN

## 2014-06-05 MED ORDER — LACTATED RINGERS IV SOLN
INTRAVENOUS | Status: DC | PRN
  Administered 2014-06-05: 15:00:00 via INTRAVENOUS

## 2014-06-05 MED ORDER — ONDANSETRON HCL 4 MG/2ML IJ SOLN
INTRAMUSCULAR | Status: AC
  Filled 2014-06-05: qty 2

## 2014-06-05 MED ORDER — DEXAMETHASONE SODIUM PHOSPHATE 10 MG/ML IJ SOLN
INTRAMUSCULAR | Status: AC
  Filled 2014-06-05: qty 1

## 2014-06-05 MED ORDER — FENTANYL CITRATE (PF) 100 MCG/2ML IJ SOLN
25.0000 ug | INTRAMUSCULAR | Status: DC | PRN
  Administered 2014-06-05 (×3): 50 ug via INTRAVENOUS

## 2014-06-05 MED ORDER — BELLADONNA ALKALOIDS-OPIUM 16.2-60 MG RE SUPP
RECTAL | Status: AC
  Filled 2014-06-05: qty 1

## 2014-06-05 MED ORDER — FLUCONAZOLE 150 MG PO TABS: 150.0000 mg | ORAL_TABLET | Freq: Once | ORAL | Status: DC

## 2014-06-05 MED ORDER — LACTATED RINGERS IV SOLN
INTRAVENOUS | Status: DC
  Administered 2014-06-05: 15:00:00 via INTRAVENOUS

## 2014-06-05 MED ORDER — DEXAMETHASONE SODIUM PHOSPHATE 10 MG/ML IJ SOLN
INTRAMUSCULAR | Status: DC | PRN
  Administered 2014-06-05: 10 mg via INTRAVENOUS

## 2014-06-05 MED ORDER — LIDOCAINE HCL 1 % IJ SOLN
INTRAMUSCULAR | Status: AC
  Filled 2014-06-05: qty 40

## 2014-06-05 SURGICAL SUPPLY — 15 items
BAG URO CATCHER STRL LF (DRAPE) ×3 IMPLANT
CATH URET 5FR 28IN CONE TIP (BALLOONS) ×2
CATH URET 5FR 70CM CONE TIP (BALLOONS) ×1 IMPLANT
CLOTH BEACON ORANGE TIMEOUT ST (SAFETY) ×3 IMPLANT
GLOVE BIOGEL M 8.0 STRL (GLOVE) ×3 IMPLANT
GOWN STRL REUS W/ TWL XL LVL3 (GOWN DISPOSABLE) ×1 IMPLANT
GOWN STRL REUS W/TWL XL LVL3 (GOWN DISPOSABLE) ×5 IMPLANT
GUIDEWIRE STR DUAL SENSOR (WIRE) ×3 IMPLANT
MANIFOLD NEPTUNE II (INSTRUMENTS) ×3 IMPLANT
PACK CYSTO (CUSTOM PROCEDURE TRAY) ×3 IMPLANT
SHEATH ACCESS URETERAL 38CM (SHEATH) ×3 IMPLANT
STENT CONTOUR 8FR X 24 (STENTS) ×3 IMPLANT
TUBING CONNECTING 10 (TUBING) ×2 IMPLANT
TUBING CONNECTING 10' (TUBING) ×1
WIRE COONS/BENSON .038X145CM (WIRE) ×3 IMPLANT

## 2014-06-05 NOTE — Op Note (Signed)
Preoperative diagnosis: Urinoma of right kidney, lower pole  Postoperative diagnosis: Same   Procedure: Cystoscopy, right double-J stent removal, right retrograde ureteropyelogram with interpretive fluoroscopy, right double-J stent placement (22 cm x 8 [place] contour without string)    Surgeon: Lillette Boxer. Lynne Takemoto, M.D.   Anesthesia: Gen.   Complications: None  Specimen(s): None  Drain(s): 65 French Foley catheter, previously mentioned stent  Indications: 69 year old female with a persistent urinoma and flank drainage following percutaneous cryoablation of a right lower pole renal mass earlier this year. The patient developed a urinoma which was drained appropriately several weeks ago with a percutaneously placed drain. 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 Unfortunately, she has evidence that the double-J stent has been obstructed, and she has had increased calyceal drainage. She presents at this time for cystoscopy and stent exchange.    Technique and findings: The patient was properly identified in the holding area and marked appropriately. She was taken to the operating room where general anesthetic was administered. She was placed in the dorsolithotomy position. Genitalia and perineum were prepped and draped. Proper timeout was performed.  A 23 French cystoscope was placed in her bladder. Bladder was inspected and found to be normal. Through the right ureteral orifice a double-J stent was identified, grasped and removed. Using a 5 [place] open-ended catheter, the right ureteral orifice was catheterized. This was advanced up into the proximal ureter where, using Omnipaque, a retrograde study of the pyelo-calyceal system was performed. There was minimal dilatation of the pyelo-calyceal system. No significant  filling defects were noted. I saw no calyceal leaks. There was no distortion of the p pyelocalyceal system.  Following recording of the digital images, I passed a guidewire through the ureteral catheter. A 0.038 inch sensor-tip guidewire was used. After adequate positioning using fluoroscopic guidance, the open-ended catheter was removed. Over top of the guidewire, I then placed an 8 [place] by 24 cm contour stent. Good proximal and distal curls were seen after the guidewire was removed. At this point, there was some bleeding from the double-J stent. This was identified and not felt to be significant. The bladder was then catheterized, after the scope was removed, with a 16 [place] Foley catheter. This was then hooked to dependent drainage.  The patient tolerated procedure well. She was awakened and then taken to the PACU in stable condition.

## 2014-06-05 NOTE — Discharge Instructions (Signed)

## 2014-06-05 NOTE — Anesthesia Preprocedure Evaluation (Addendum)
Anesthesia Evaluation  Patient identified by MRN, date of birth, ID band Patient awake    Reviewed: Allergy & Precautions, NPO status , Patient's Chart, lab work & pertinent test results  Airway Mallampati: II  TM Distance: >3 FB Neck ROM: Full    Dental no notable dental hx.    Pulmonary neg pulmonary ROS,  breath sounds clear to auscultation  Pulmonary exam normal       Cardiovascular hypertension, Normal cardiovascular examRhythm:Regular Rate:Normal     Neuro/Psych  Headaches,  Neuromuscular disease negative psych ROS   GI/Hepatic negative GI ROS, Neg liver ROS,   Endo/Other  negative endocrine ROS  Renal/GU Renal disease  negative genitourinary   Musculoskeletal negative musculoskeletal ROS (+)   Abdominal   Peds negative pediatric ROS (+)  Hematology  (+) anemia ,   Anesthesia Other Findings Hx polio Paraplegia  Reproductive/Obstetrics negative OB ROS                            Lab Results  Component Value Date   WBC 6.2 06/04/2014   HGB 12.2 06/04/2014   HCT 38.7 06/04/2014   MCV 83.8 06/04/2014   PLT 306 06/04/2014   Lab Results  Component Value Date   CREATININE 0.46 06/04/2014   BUN 13 06/04/2014   NA 139 06/04/2014   K 4.2 06/04/2014   CL 102 06/04/2014   CO2 27 06/04/2014    Anesthesia Physical  Anesthesia Plan  ASA: III  Anesthesia Plan: General   Post-op Pain Management:    Induction: Intravenous  Airway Management Planned: LMA  Additional Equipment:   Intra-op Plan:   Post-operative Plan:   Informed Consent: I have reviewed the patients History and Physical, chart, labs and discussed the procedure including the risks, benefits and alternatives for the proposed anesthesia with the patient or authorized representative who has indicated his/her understanding and acceptance.   Dental advisory given  Plan Discussed with: CRNA, Anesthesiologist and  Surgeon  Anesthesia Plan Comments:        Anesthesia Quick Evaluation

## 2014-06-05 NOTE — Telephone Encounter (Addendum)
Patient aware of results  ----- Message from Volanda Napoleon, MD sent at 06/04/2014  5:43 PM EDT ----- Please call her and tell her that there is no obvious cancer that is noted on her scan. Thanks

## 2014-06-05 NOTE — H&P (Signed)
H&P  Chief Complaint: Urine leak from right kidney  History of Present Illness: Marie Thomas is a 69 y.o. 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 Up to this point, she has had percutaneous drain placed into the urinoma which has been working fine. As further management, she underwent stenting of her right kidney as well as Foley catheter placement. Unfortunately, she had recent increase in drainage from her percutaneous drain, with evidence of obstruction of the right ureteral stent. She resents at this time for cystoscopy and right double-J stent exchange.  Past Medical History  Diagnosis Date  . Polio   . 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 H/O steroid therapy     Steroid use -last used 1 week ago  . Pain at rest     abdominal pain, right side- intermittent"dx. right renal mass"  . Headache     migraines-have occurred less since retired '99.  . Neuromuscular disorder     polio- no mobility lower extremities bilaterally  . Hypertension     Past Surgical History  Procedure Laterality Date  . Abdominal hysterectomy    . Leg tendon surgery      age 39 or 69 yrs old  . Cystoscopy with stent placement Right 04/28/2014    Procedure: CYSTOSCOPY WITH RIGHT RETROGRADE AND STENT PLACEMENT;  Surgeon: Franchot Gallo, MD;  Location: [facility];  Service: Urology;  Laterality: Right;    Home Medications:  Medications Prior to Admission  Medication Sig Dispense Refill  . acetaminophen (TYLENOL) 500 MG tablet Take 500 mg by mouth as needed for mild pain.     . Cholecalciferol (VITAMIN D) 2000 UNITS tablet Take 2,000 Units by mouth daily.    Marland Kitchen docusate sodium (COLACE) 100 MG capsule Take 1 capsule (100 mg total) by mouth every 12 (twelve) hours. (Patient  taking differently: Take 100 mg by mouth 2 (two) times daily as needed for moderate constipation. ) 60 capsule 0  . feeding supplement, ENSURE ENLIVE, (ENSURE ENLIVE) LIQD Take 237 mLs by mouth 2 (two) times daily between meals.    Marland Kitchen HYDROcodone-acetaminophen (NORCO/VICODIN) 5-325 MG per tablet Take 1-2 tablets by mouth every 6 (six) hours as needed for moderate pain or severe pain. 20 tablet 0  . sulfamethoxazole-trimethoprim (BACTRIM DS,SEPTRA DS) 800-160 MG per tablet Take 1 tablet by mouth 2 (two) times daily.      Allergies: No Known Allergies  Family History  Problem Relation Age of Onset  . Heart disease Brother   . Prostate cancer Brother     Social History:  reports that she has never smoked. She has never used smokeless tobacco. She reports that she drinks alcohol. She reports that she does not use illicit drugs.  ROS: A complete review of systems was performed.  All systems are negative except for pertinent findings as noted.  Physical Exam:  Vital signs in last 24 hours: Temp:  [97.6 F (36.4 C)] 97.6 F (36.4 C) (05/19 1417) Pulse Rate:  [96-110] 96 (05/19 1440) Resp:  [16] 16 (05/19 1417) BP: (160)/(84) 160/84 mmHg (05/19 1417) SpO2:  [100 %] 100 % (05/19 1417) Weight:  [121 lb (54.885 kg)] 121 lb (54.885 kg) (05/19 1417) General:  Alert and oriented, No acute distress HEENT:  Normocephalic, atraumatic Neck: No JVD or lymphadenopathy Cardiovascular: Regular rate and rhythm Lungs: Clear bilaterally Abdomen: Soft, nontender, nondistended, no abdominal masses. Drain and right flank Back: No CVA tenderness Extremities: Lower extremity paralysis. Braces are in place. Neurologic: Grossly intact  Laboratory Data:  No results found for this or any previous visit (from the past 24 hour(s)). No results found for this or any previous visit (from the past 240 hour(s)). Creatinine:  Recent Labs  06/04/14 1321  CREATININE 0.46    Radiologic Imaging: Ct Abd Wo & W  Cm  06/04/2014   CLINICAL DATA:  Cryoablation. Right flank pain. Renal angiomyolipoma.  EXAM: CT ABDOMEN WITHOUT AND WITH CONTRAST  TECHNIQUE: Multidetector CT imaging of the abdomen was performed following the standard protocol before and following the bolus administration of intravenous contrast.  CONTRAST:  137mL OMNIPAQUE IOHEXOL 300 MG/ML  SOLN  COMPARISON:  Multiple exams, including 05/21/2014  FINDINGS: Lower chest: Linear subsegmental atelectasis is in both lower lobes. Mild cylindrical bronchiectasis also observed in both lower lobes, without overt airway thickening currently.  Hepatobiliary: 3.3 by 2.7 cm region of hypodensity inferiorly in segment 6 of the liver with internal calcifications is similar to 05/21/2014. Stable lateral segment left hepatic lobe cyst. Stable mild proximal intrahepatic biliary dilatation, vertically on the left.  Pancreas: Unremarkable  Spleen: Unremarkable  Adrenals/Urinary Tract: Drain remains in place in the right posterior perirenal space adjacent to the ablation site. Fluid/soft tissue attenuation in this vicinity and tracking along the posterior diaphragmatic margin, without significant abnormal enhancement or findings of recurrent tumor. On the delayed phase images, there appears to be a diverticular type outpouching of the collecting system towards the site of the ablation, and potentially a an extremely thin connection of this caliceal diverticulum to the area of the drain on images 41 through 42 of series 6. Also on image 40 of series 6 we demonstrate a small collection of contrast medium centrally within the formed pigtail loop which is not present on earlier contrast phases, and which gross larger on the delayed phase images. The appearance indicates that there is a small leak from the calyceal diverticulum into the vicinity of the pigtail.  The patient also has a double-J ureteral stent which extends down towards the pelvis.  Bilateral peripelvic cysts are present.   Stomach/Bowel: Descending and sigmoid colon diverticulosis. Antral wall thickening in the stomach, stable.  Vascular/Lymphatic: Mild distal aortic atherosclerotic calcification.  Other: No supplemental non-categorized findings.  Musculoskeletal: Small lipoma of the right external oblique muscle.  IMPRESSION: 1. There is tiny connection between the calyceal diverticulum of the right kidney in the ablation zone, and the small cavity that the pigtail is draining. This allows contrast on the delayed phase images to collect centrally within the loop. There is likely some surrounding blood products tracking along the back of the kidney. No definite residual tumor identified. 2. Otherwise stable appearance.   Electronically Signed   By: Van Clines M.D.   On: 06/04/2014 17:27    Impression/Assessment:  Urinoma of right perinephric region, status post cryoablation of a lipid poor angiomyolipoma in January of this year. This is currently drained with a percutaneous drain, appropriately positioned. The double-J stent draining her kidney to her bladder, to improve drainage of her kidney at a lower pressure is now obstructed.  Plan:  Return to the operating room for cystoscopy, right double-J stent exchange  Jorja Loa 06/05/2014, 3:31 PM  Lillette Boxer. Tuesday Terlecki MD

## 2014-06-05 NOTE — Anesthesia Postprocedure Evaluation (Signed)
  Anesthesia Post-op Note  Patient: Marie Thomas  Procedure(s) Performed: Procedure(s): CYSTOSCOPY WITH STENT EXCHANGE , RETROGRADE PYLEOGRAM (Right)  Patient Location: PACU  Anesthesia Type:General  Level of Consciousness: awake and alert   Airway and Oxygen Therapy: Patient Spontanous Breathing  Post-op Pain: mild  Post-op Assessment: Post-op Vital signs reviewed  Post-op Vital Signs: Reviewed  Last Vitals:  Filed Vitals:   06/05/14 1728  BP: 160/78  Pulse: 96  Temp: 36.6 C  Resp: 14    Complications: No apparent anesthesia complications

## 2014-06-05 NOTE — Transfer of Care (Signed)
Immediate Anesthesia Transfer of Care Note  Patient: Marie Thomas  Procedure(s) Performed: Procedure(s): CYSTOSCOPY WITH STENT EXCHANGE , RETROGRADE PYLEOGRAM (Right)  Patient Location: PACU  Anesthesia Type:General  Level of Consciousness:  sedated, patient cooperative and responds to stimulation  Airway & Oxygen Therapy:Patient Spontanous Breathing and Patient connected to face mask oxgen  Post-op Assessment:  Report given to PACU RN and Post -op Vital signs reviewed and stable  Post vital signs:  Reviewed and stable  Last Vitals:  Filed Vitals:   06/05/14 1440  BP:   Pulse: 96  Temp:   Resp:     Complications: No apparent anesthesia complications

## 2014-06-06 ENCOUNTER — Encounter (HOSPITAL_COMMUNITY): Payer: Self-pay | Admitting: Urology

## 2014-06-11 ENCOUNTER — Telehealth: Payer: Self-pay | Admitting: Radiology

## 2014-06-11 ENCOUNTER — Other Ambulatory Visit: Payer: Self-pay | Admitting: Interventional Radiology

## 2014-06-11 ENCOUNTER — Other Ambulatory Visit (HOSPITAL_COMMUNITY): Payer: Self-pay | Admitting: Interventional Radiology

## 2014-06-11 DIAGNOSIS — D3001 Benign neoplasm of right kidney: Secondary | ICD-10-CM

## 2014-06-11 NOTE — Telephone Encounter (Signed)
Left message requesting patient call back for status update.    Monquie Fulgham Riki Rusk, RN 06/11/2014 11:07 AM

## 2014-06-13 ENCOUNTER — Other Ambulatory Visit: Payer: Self-pay | Admitting: Urology

## 2014-06-13 ENCOUNTER — Encounter (HOSPITAL_COMMUNITY): Payer: Self-pay | Admitting: *Deleted

## 2014-06-18 MED ORDER — GENTAMICIN SULFATE 40 MG/ML IJ SOLN
5.0000 mg/kg | INTRAVENOUS | Status: DC
  Filled 2014-06-18: qty 6.5

## 2014-06-19 ENCOUNTER — Ambulatory Visit (HOSPITAL_COMMUNITY): Admission: RE | Admit: 2014-06-19 | Payer: PPO | Source: Ambulatory Visit | Admitting: Urology

## 2014-06-19 SURGERY — CYSTOSCOPY, WITH RETROGRADE PYELOGRAM AND URETERAL STENT INSERTION
Anesthesia: General | Laterality: Right

## 2014-06-25 ENCOUNTER — Ambulatory Visit (HOSPITAL_COMMUNITY)
Admission: RE | Admit: 2014-06-25 | Discharge: 2014-06-25 | Disposition: A | Discharge status: Home / Self Care | Payer: PPO | Source: Ambulatory Visit | Attending: Interventional Radiology | Admitting: Interventional Radiology

## 2014-06-25 ENCOUNTER — Encounter (HOSPITAL_COMMUNITY): Payer: Self-pay

## 2014-06-25 ENCOUNTER — Other Ambulatory Visit (HOSPITAL_COMMUNITY): Payer: Self-pay | Admitting: Interventional Radiology

## 2014-06-25 DIAGNOSIS — N368 Other specified disorders of urethra: Secondary | ICD-10-CM | POA: Insufficient documentation

## 2014-06-25 DIAGNOSIS — D3001 Benign neoplasm of right kidney: Secondary | ICD-10-CM

## 2014-06-25 DIAGNOSIS — Z466 Encounter for fitting and adjustment of urinary device: Secondary | ICD-10-CM | POA: Diagnosis not present

## 2014-06-25 DIAGNOSIS — Z936 Other artificial openings of urinary tract status: Secondary | ICD-10-CM | POA: Diagnosis not present

## 2014-06-25 DIAGNOSIS — D1771 Benign lipomatous neoplasm of kidney: Secondary | ICD-10-CM | POA: Insufficient documentation

## 2014-06-25 MED ORDER — IOHEXOL 300 MG/ML  SOLN
50.0000 mL | Freq: Once | INTRAMUSCULAR | Status: AC | PRN
  Administered 2014-06-25: 10 mL

## 2014-06-25 MED ORDER — IOHEXOL 300 MG/ML  SOLN
100.0000 mL | Freq: Once | INTRAMUSCULAR | Status: AC | PRN
  Administered 2014-06-25: 100 mL via INTRAVENOUS

## 2014-06-25 MED ORDER — IOHEXOL 300 MG/ML  SOLN: 10.0000 mL | Freq: Once | INTRAMUSCULAR | Status: AC | PRN

## 2014-07-01 ENCOUNTER — Telehealth: Payer: Self-pay | Admitting: Radiology

## 2014-07-01 NOTE — Telephone Encounter (Signed)
Patient reports that she is doing well.  Has an appointment on 07/09/2014 for stent removal by Dr Diona Fanti.  Next follow up MR & follow up app't w/ Dr Laurence Ferrari to be scheduled for mid Sept 2016.  Deziah Renwick Riki Rusk, RN 07/01/2014 3:59 PM

## 2014-09-09 ENCOUNTER — Other Ambulatory Visit: Payer: Self-pay | Admitting: Radiology

## 2014-09-09 ENCOUNTER — Other Ambulatory Visit (HOSPITAL_COMMUNITY): Payer: Self-pay | Admitting: Interventional Radiology

## 2014-09-09 DIAGNOSIS — D3001 Benign neoplasm of right kidney: Secondary | ICD-10-CM

## 2014-09-11 ENCOUNTER — Other Ambulatory Visit (HOSPITAL_COMMUNITY): Payer: Self-pay | Admitting: Interventional Radiology

## 2014-09-11 DIAGNOSIS — N2889 Other specified disorders of kidney and ureter: Secondary | ICD-10-CM

## 2014-10-01 ENCOUNTER — Other Ambulatory Visit: Payer: Self-pay

## 2014-10-01 DIAGNOSIS — Z1231 Encounter for screening mammogram for malignant neoplasm of breast: Secondary | ICD-10-CM

## 2014-10-02 LAB — CREATININE WITH EST GFR: CREATININE: 0.47 mg/dL — AB (ref 0.50–0.99)

## 2014-10-02 LAB — BUN: BUN: 13 mg/dL (ref 7–25)

## 2014-10-08 ENCOUNTER — Telehealth: Payer: Self-pay | Admitting: *Deleted

## 2014-10-08 ENCOUNTER — Ambulatory Visit
Admission: RE | Admit: 2014-10-08 | Discharge: 2014-10-08 | Disposition: A | Discharge status: Home / Self Care | Payer: PPO | Source: Ambulatory Visit | Attending: Interventional Radiology | Admitting: Interventional Radiology

## 2014-10-08 ENCOUNTER — Ambulatory Visit (HOSPITAL_COMMUNITY)
Admission: RE | Admit: 2014-10-08 | Discharge: 2014-10-08 | Disposition: A | Discharge status: Home / Self Care | Payer: PPO | Source: Ambulatory Visit | Attending: Interventional Radiology | Admitting: Interventional Radiology

## 2014-10-08 DIAGNOSIS — N281 Cyst of kidney, acquired: Secondary | ICD-10-CM | POA: Diagnosis not present

## 2014-10-08 DIAGNOSIS — D3001 Benign neoplasm of right kidney: Secondary | ICD-10-CM | POA: Insufficient documentation

## 2014-10-08 DIAGNOSIS — N2889 Other specified disorders of kidney and ureter: Secondary | ICD-10-CM

## 2014-10-08 MED ORDER — GADOBENATE DIMEGLUMINE 529 MG/ML IV SOLN
15.0000 mL | Freq: Once | INTRAVENOUS | Status: AC | PRN
  Administered 2014-10-08: 11 mL via INTRAVENOUS

## 2014-10-08 NOTE — Progress Notes (Signed)
Chief Complaint: Patient was seen in consultation today for  Chief Complaint  Patient presents with  . Follow-up    8 mo follow up Cryoablation of Right Renal AML     at the request of Dr. Diona Fanti  Referring Physician(s): Dr. Diona Fanti  History of Present Illness: Marie Thomas is a 69 y.o. female who underwent CT guided cryoablation of a right renal mass. The pathology turned out to be Angiomyolipoma. She initially did well post procedure, but developed a perinephric urinoma from a leaking calyx. She had to have a right percutaneous drain for for few months until the urinoma resolved, and this was subsequently removed in June of 2016. She reports feeling well since then. She denies abd pain, flank pain, fevers, chills She does report some urinary urgency, frequency, and sometimes a feeling of incomplete emptying. She did not have these symptoms prior to all the procedures. These symptoms are not severe. She denies dysuria, change in color/smell of urine. She has not been back to see her Urologist in a while. She had follow up MRI today and is here to go over results.  Past Medical History  Diagnosis Date  . Polio   . 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 H/O steroid therapy     Steroid use -last used 1 week ago  . Pain at rest     abdominal pain, right side- intermittent"dx. right renal mass"  . Headache     migraines-have occurred less since retired '99.  . Neuromuscular disorder     polio- no mobility lower extremities bilaterally  . Hypertension     Past Surgical History  Procedure Laterality Date  . Abdominal hysterectomy    . Leg tendon surgery      age 34 or 69 yrs old  . Cystoscopy with stent placement Right 04/28/2014    Procedure: CYSTOSCOPY WITH RIGHT RETROGRADE AND STENT PLACEMENT;  Surgeon: Franchot Gallo, MD;  Location: [facility];  Service: Urology;   Laterality: Right;  . Cystoscopy w/ ureteral stent placement Right 06/05/2014    Procedure: CYSTOSCOPY WITH STENT EXCHANGE , RETROGRADE PYLEOGRAM;  Surgeon: Franchot Gallo, MD;  Location: [facility];  Service: Urology;  Laterality: Right;    Allergies: Review of patient's allergies indicates no known allergies.  Medications: Prior to Admission medications   Medication Sig Start Date End Date Taking? Authorizing Provider  acetaminophen (TYLENOL) 500 MG tablet Take 500 mg by mouth as needed for mild pain.    Yes Historical Provider, MD  Cholecalciferol (VITAMIN D) 2000 UNITS tablet Take 2,000 Units by mouth daily.   Yes Historical Provider, MD  docusate sodium (COLACE) 100 MG capsule Take 1 capsule (100 mg total) by mouth every 12 (twelve) hours. Patient taking differently: Take 100 mg by mouth 2 (two) times daily as needed for moderate constipation.  11/13/13  Yes Sherwood Gambler, MD  feeding supplement, ENSURE ENLIVE, (ENSURE ENLIVE) LIQD Take 237 mLs by mouth 2 (two) times daily between meals. 04/25/14  Yes Modena Jansky, MD  fluconazole (DIFLUCAN) 150 MG tablet Take 1 tablet (150 mg total) by mouth once. Patient not taking: Reported on 10/08/2014 06/05/14   Franchot Gallo, MD  HYDROcodone-acetaminophen [facility]) 5-325 MG per tablet Take 1-2 tablets by mouth every 4 (four) hours as needed for moderate pain. 06/05/14   Franchot Gallo, MD  HYDROcodone-acetaminophen (NORCO/VICODIN) 5-325 MG per tablet Take 1-2 tablets by mouth  every 6 (six) hours as needed for moderate pain or severe pain. 04/25/14   Modena Jansky, MD     Family History  Problem Relation Age of Onset  . Heart disease Brother   . Prostate cancer Brother     Social History   Social History  . Marital Status: Single    Spouse Name: N/A  . Number of Children: N/A  . Years of Education: N/A   Social History Main Topics  . Smoking status: Never Smoker   . Smokeless tobacco: Never Used     Comment: NEVER USED TOBACCO    . Alcohol Use: 0.0 oz/week    0 Standard drinks or equivalent per week     Comment: social  . Drug Use: No  . Sexual Activity: Not Currently   Other Topics Concern  . Not on file   Social History Narrative     Review of Systems: A 12 point ROS discussed and pertinent positives are indicated in the HPI above.  All other systems are negative.  Review of Systems  Vital Signs: BP 137/66 mmHg  Pulse 94  Temp(Src) 98.3 F (36.8 C) (Oral)  Resp 14  SpO2 100%  Physical Exam  Constitutional: She is oriented to person, place, and time. She appears well-developed. No distress.  Cardiovascular: Normal rate, regular rhythm and normal heart sounds.   Pulmonary/Chest: Effort normal and breath sounds normal. No respiratory distress.  Abdominal: Soft. She exhibits no distension. There is no tenderness.  Neurological: She is alert and oriented to person, place, and time.  Psychiatric: She has a normal mood and affect. Judgment normal.     Imaging: Mr Abdomen W Wo Contrast  10/08/2014   CLINICAL DATA:  Followup right renal angiomyolipoma. Status post resection.  EXAM: MRI ABDOMEN WITHOUT AND WITH CONTRAST  TECHNIQUE: Multiplanar multisequence MR imaging of the abdomen was performed both before and after the administration of intravenous contrast.  CONTRAST:  31mL MULTIHANCE GADOBENATE DIMEGLUMINE 529 MG/ML IV SOLN  COMPARISON:  Multiple prior CT scans and prior MRI 11/27/2013  FINDINGS: Lower chest: The lung bases are clear. No pleural effusion. No pericardial effusion.  Hepatobiliary: Stable area of focal scarring type change in the right hepatic lobe adjacent to the gallbladder. 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 No worrisome contrast enhancement. Surrounding scarring change in the liver. No intrahepatic biliary dilatation. Stable segment 3 cyst. The gallbladder demonstrates a layering high T1 a tear which is likely cholesterol laden bile. No common  bile duct dilatation.  Pancreas: No mass, inflammation or ductal dilatation.  Spleen: Normal size.  No focal lesions.  Adrenals/Urinary Tract: The adrenal glands are normal and stable.  The right kidney demonstrates surgical changes involving the upper pole posteriorly from a prior partial nephrectomy. There is mildly enhancing scar tissue/ granulation tissue between the kidney and the abdominal wall. This is likely related to the prior surgery hand catheter drainage. I do not see any evidence of residual tumor. There are small simple appearing right renal cysts.  The left kidney demonstrates fairly extensive parapelvic cysts and small parenchymal cysts but no worrisome renal lesion or hydronephrosis.  Stomach/Bowel: The stomach, duodenum, visualized small bowel and visualized colon are unremarkable. No inflammatory changes, mass lesions or obstructive findings.  Vascular/Lymphatic: No mesenteric or retroperitoneal mass or adenopathy. Small scattered retroperitoneal lymph nodes are stable. The aorta and branch vessels are patent. The major venous structures are patent.  Other: No abdominal fluid collections or abdominal  wall hernia. There is a stable lipoma involving the oblique abdominal muscles on the right side.  Musculoskeletal: No significant bony findings.  IMPRESSION: 1. Stable area of focal scarring type change involving the right hepatic lobe adjacent to the gallbladder. Stable small cysts. No worrisome hepatic lesions. 2. Layering T1 bright material in the gallbladder likely cholesterol laden bile. 3. Focal enhancing granulation tissue/scar tissue involving the upper pole region of the right kidney posteriorly. Status post partial nephrectomy without findings for recurrent tumor. 4. Extensive parapelvic cysts involving the left kidney.   Electronically Signed   By: Marijo Sanes M.D.   On: 10/08/2014 13:28    Labs:  CBC:  Recent Labs  04/24/14 0442 04/25/14 0522 05/23/14 1127 06/04/14 1321    WBC 11.5* 7.2 6.4 6.2  HGB 9.9* 9.9* 12.3 12.2  HCT 31.4* 32.6* 39.1 38.7  PLT 233 228 272 306    COAGS:  Recent Labs  12/05/13 0904 02/04/14 1525 04/23/14 1344  INR 1.02 0.94 1.13  APTT 24 28 25     BMP:  Recent Labs  04/23/14 1344 04/24/14 0442 04/25/14 0522 06/04/14 1321 10/01/14 1052  NA 137 137 143 139  --   K 3.6 2.8* 4.1 4.2  --   CL 97 101 108 102  --   CO2 27 27 29 27   --   GLUCOSE 105* 119* 153* 102*  --   BUN 26* 19 16 13 13   CALCIUM 9.3 8.2* 9.0 9.1  --   CREATININE 0.95 0.58 0.47* 0.46 0.47*  GFRNONAA 60* >90 >90 >60 >89  GFRAA 70* >90 >90 >60 >89    LIVER FUNCTION TESTS:  Recent Labs  11/13/13 0337 04/23/14 1344  BILITOT 0.3 0.8  AST 21 23  ALT 19 22  ALKPHOS 52 109  PROT 7.4 7.2  ALBUMIN 4.4 3.2*    Assessment: 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   SignedAscencion Dike 10/08/2014, 2:27 PM   Please refer to Dr.  Hyman Bower of this note for management and plan.

## 2014-10-08 NOTE — Telephone Encounter (Addendum)
 -----   Message from Volanda Napoleon, MD sent at 10/08/2014  1:45 PM EDT ----- Call - no recurrent kidney cancer!!  pete

## 2014-10-09 ENCOUNTER — Telehealth: Payer: Self-pay | Admitting: *Deleted

## 2014-10-09 NOTE — Telephone Encounter (Addendum)
Patient aware of results  ----- Message from Volanda Napoleon, MD sent at 10/08/2014  1:45 PM EDT ----- Call - no recurrent kidney cancer!!  pete

## 2014-12-15 ENCOUNTER — Ambulatory Visit: Payer: PPO

## 2015-01-06 ENCOUNTER — Ambulatory Visit: Payer: PPO

## 2015-01-21 ENCOUNTER — Ambulatory Visit: Payer: PPO

## 2015-04-21 ENCOUNTER — Ambulatory Visit: Payer: PPO

## 2015-04-30 DIAGNOSIS — H524 Presbyopia: Secondary | ICD-10-CM | POA: Diagnosis not present

## 2015-04-30 DIAGNOSIS — H25813 Combined forms of age-related cataract, bilateral: Secondary | ICD-10-CM | POA: Diagnosis not present

## 2015-05-06 ENCOUNTER — Ambulatory Visit: Payer: PPO

## 2015-05-07 ENCOUNTER — Ambulatory Visit: Payer: PPO

## 2015-05-18 ENCOUNTER — Ambulatory Visit: Payer: PPO

## 2015-06-17 ENCOUNTER — Ambulatory Visit
Admission: RE | Admit: 2015-06-17 | Discharge: 2015-06-17 | Disposition: A | Discharge status: Home / Self Care | Payer: PPO | Source: Ambulatory Visit

## 2015-06-17 DIAGNOSIS — Z1231 Encounter for screening mammogram for malignant neoplasm of breast: Secondary | ICD-10-CM | POA: Diagnosis not present

## 2015-09-15 ENCOUNTER — Other Ambulatory Visit: Payer: Self-pay | Admitting: Radiology

## 2015-09-15 ENCOUNTER — Other Ambulatory Visit (HOSPITAL_COMMUNITY): Payer: Self-pay | Admitting: Interventional Radiology

## 2015-09-15 DIAGNOSIS — D3001 Benign neoplasm of right kidney: Secondary | ICD-10-CM

## 2015-09-15 DIAGNOSIS — N2889 Other specified disorders of kidney and ureter: Secondary | ICD-10-CM

## 2015-10-20 ENCOUNTER — Encounter: Payer: Self-pay | Admitting: Radiology

## 2015-11-28 IMAGING — CT CT ABDOMEN WO/W CM
4 of 12 series · 14 of 46 positions shown, 18 images · IV contrast (OMNIPAQUE)
Comparison: Prior CT scan of the abdomen 06/04/2014

CLINICAL DATA: 68-year-old female with a history of lipid poor
right renal angiomyolipoma which was treated by percutaneous
cryoablation. Subsequently, she developed a delayed postprocedural
hemorrhage which was further complicated by a calyceal leak,
urinoma, super infection of urinoma and sepsis. She has since been
treated with percutaneous drainage of the urinoma as well as
placement of a double-J ureteral stent. Evaluate for residual
urinoma and persistent calyceal leak.

EXAM:
CT ABDOMEN WITHOUT AND WITH CONTRAST
TECHNIQUE: Multidetector CT imaging of the abdomen was performed following the
standard protocol before and following the bolus administration of
intravenous contrast.
CONTRAST:  100mL OMNIPAQUE IOHEXOL 300 MG/ML  SOLN

[Series 4: renal arterial · axial · arterial · 0.67mm/px · z∈[-422,-350]mm · 2 of 74 slices shown]
[im 25/74  soft-tissue]
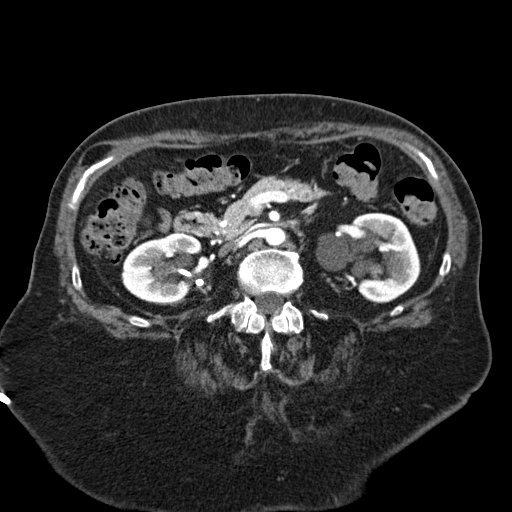
[im 49/74  soft-tissue]
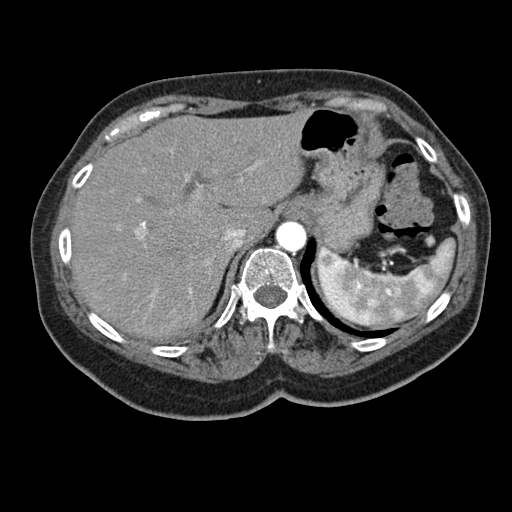

[Series 6: venous · axial · portal-venous · 0.67mm/px · z∈[-422,-350]mm · 2 of 74 slices shown, 5 images]
[im 25/74  soft-tissue]
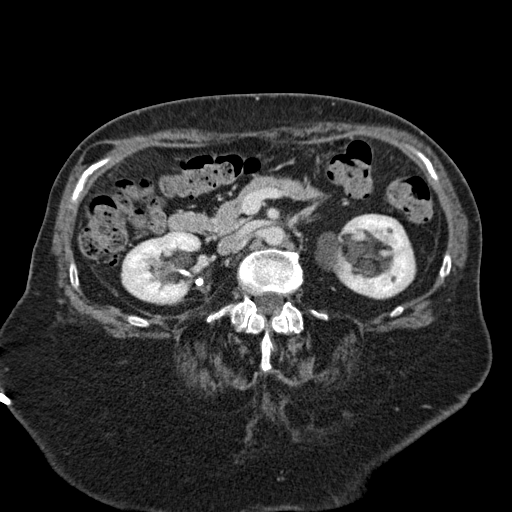
[im 25/74  lung]
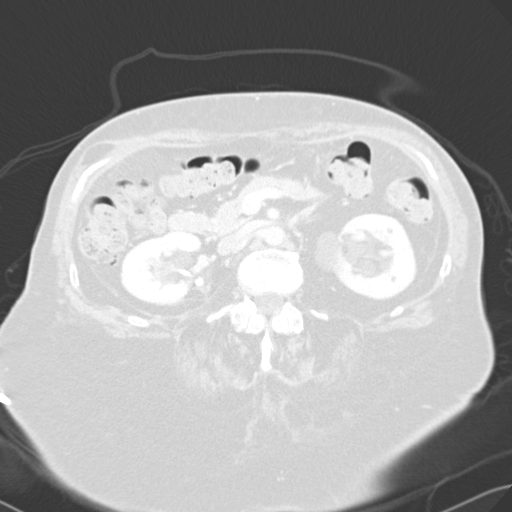
[im 25/74  bone]
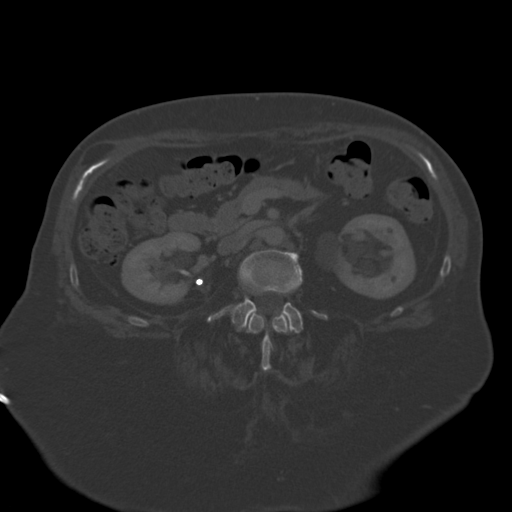
[im 49/74  soft-tissue]
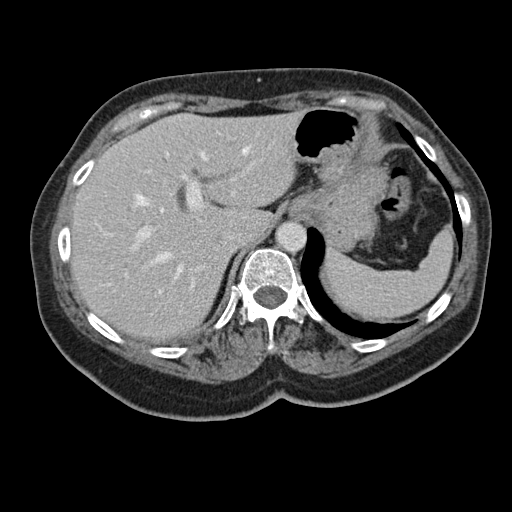
[im 49/74  lung]
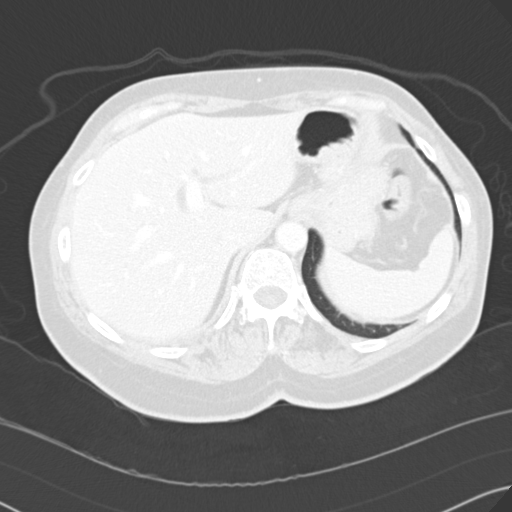

[Series 10: kidney delays · axial · 0.67mm/px · z∈[-494,-363]mm · 8 of 304 slices shown]
[im 21/304  soft-tissue]
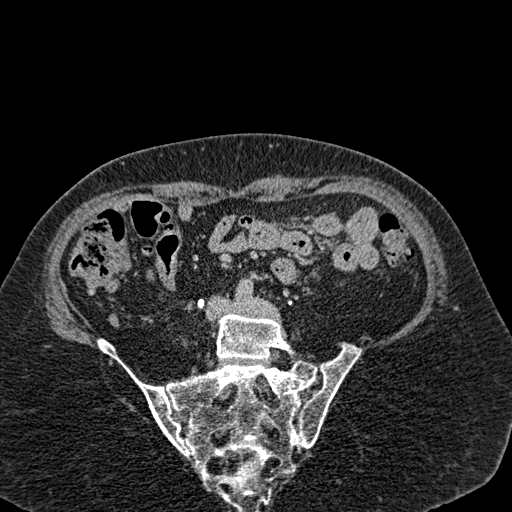
[im 61/304  soft-tissue]
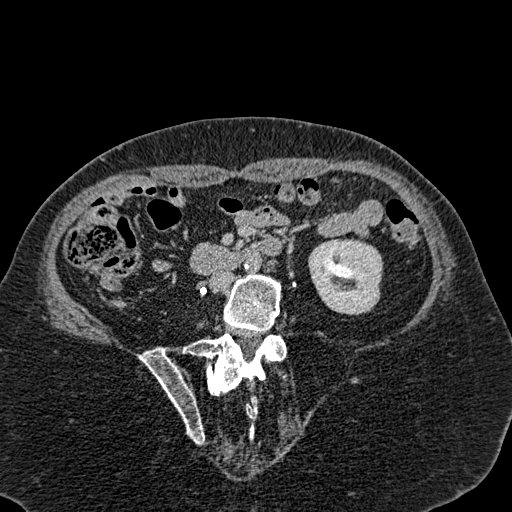
[im 102/304  soft-tissue]
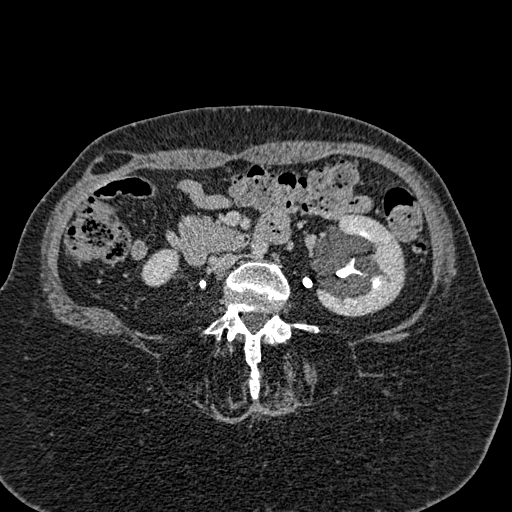
[im 142/304  soft-tissue]
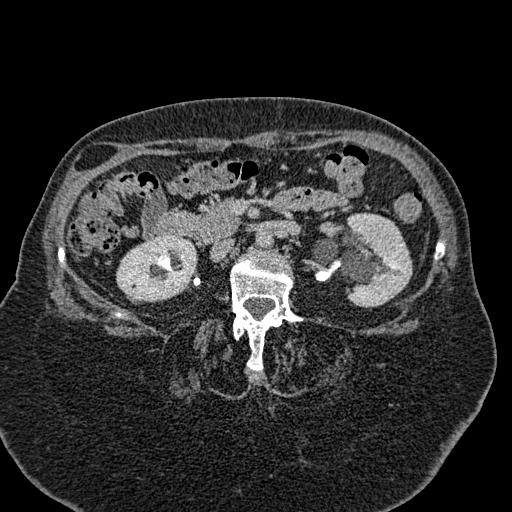
[im 162/304  soft-tissue]
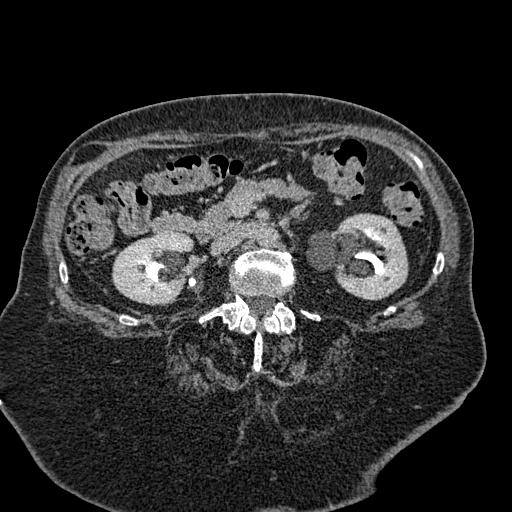
[im 203/304  soft-tissue]
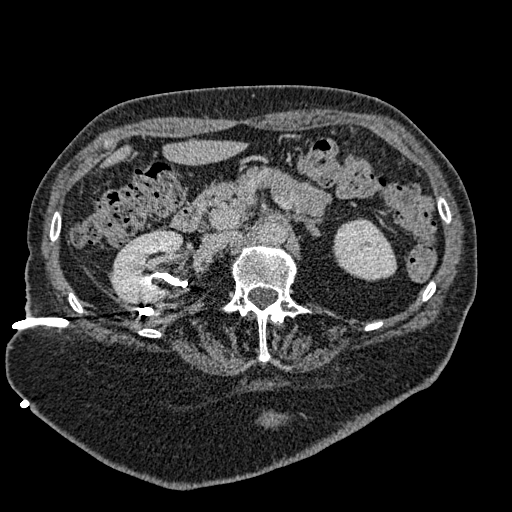
[im 243/304  soft-tissue]
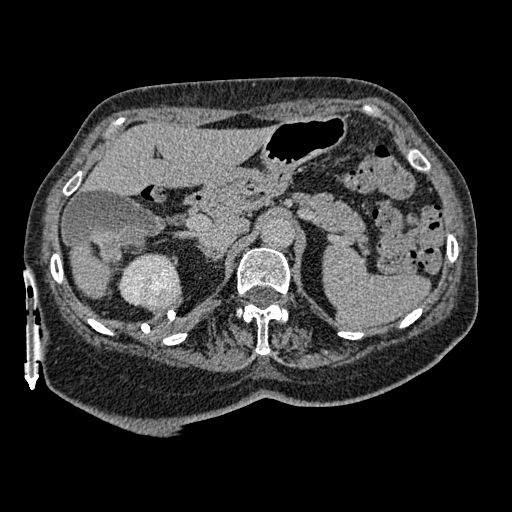
[im 283/304  soft-tissue]
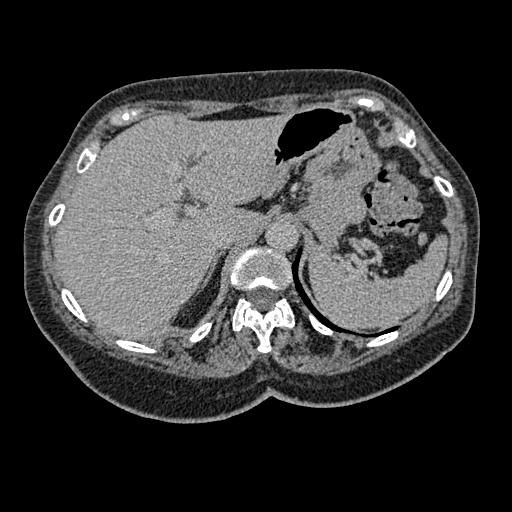

[Series 602: <mpr thick range> · coronal · 0.67mm/px · 2 of 87 slices shown, 3 images]
[im 29/87  soft-tissue]
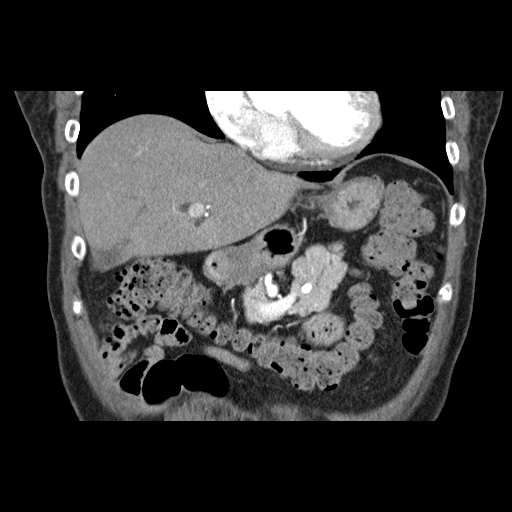
[im 29/87  bone]
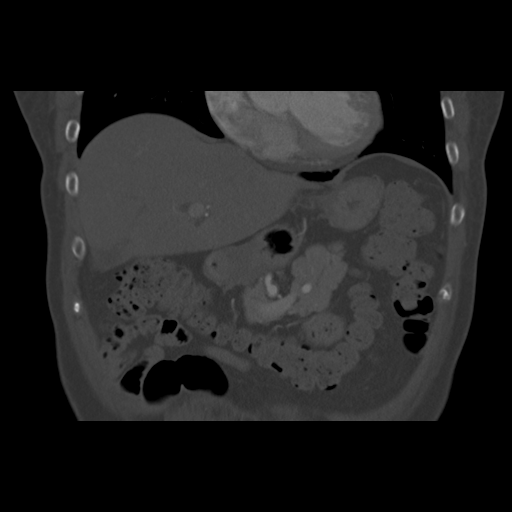
[im 58/87  soft-tissue]
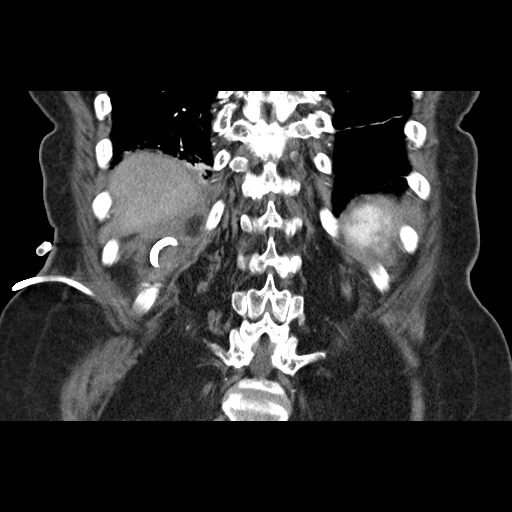

[14 of 46 positions shown; findings below may reference images not displayed]

FINDINGS: Lower Chest: Trace dependent atelectasis in the lower lobes.
Otherwise, the visualized lung bases are clear. The visualized
cardiac structures are within normal limits for size. No pericardial
effusion. Unremarkable distal thoracic esophagus.

Abdomen: Unremarkable CT appearance of the stomach, duodenum,
spleen, adrenal glands and pancreas. Normal hepatic contour and
morphology. Small circumscribed hypoechoic lesions remain unchanged
in size and configuration and consistent with biliary cysts. The
previously noted nonspecific hypodense lesion with internal
dystrophic calcification centrally within hepatic segment [DATE]
remains unchanged at approximately 3 x 2.6 cm. This has been
previously biopsied and found to be benign. Gallbladder is
unremarkable. No intra or extrahepatic biliary ductal dilatation.

The right perinephric drainage catheter remains in unchanged
position. There is no evidence of residual fluid collection about or
within the drainage catheter. There is no evidence of calyceal leak
on the delayed images. Double-J ureteral stent remains in good
position. Cryoablation defect is again noted at the posterior aspect
of the right kidney. No evidence of residual mass. Normal renal
parenchymal enhancement. Small right-sided parapelvic renal cysts
again noted. Small intraparenchymal cysts is well. On the left, the
left renal parenchyma again enhances normally. No evidence of
hydronephrosis. Extensive parapelvic and parenchymal cysts without
interval change.

Unremarkable visualized bowels.

Bones/Soft Tissues: No acute fracture or aggressive appearing lytic
or blastic osseous lesion.

Vascular: No significant atherosclerotic vascular disease,
aneurysmal dilatation or acute abnormality.
IMPRESSION: 1. Interval resolution of previously noted calyceal leak.
2. No significant residual urinoma. The perinephric drainage
catheter remains in good position.
3. Right-sided ureteral stent.

## 2015-11-28 IMAGING — XA IR NEPHROSTOGRAM EXISTING ACCESS RIGHT
4 series · 10 of 10 positions shown · non-contrast
Comparison: none

CLINICAL DATA: 68-year-old female with a history of lipid poor
right renal angiomyolipoma which was treated by percutaneous
cryoablation. Subsequently, she developed a delayed postprocedural
hemorrhage which was further complicated by a calyceal leak,urinoma,
super infection of urinoma and sepsis. She has since been treated
with percutaneous drainage of the urinoma as well as placement of a
double-J ureteral stent. Evaluate for persistent calyceal leak.
TECHNIQUE: Informed consent was obtained from the patient following explanation
of the procedure, risks, benefits and alternatives. The patient
understands, agrees and consents for the procedure. All questions
were addressed. A time out was performed.

[Series 2: care single · 1 of 1 slices shown]
[im 1/1]
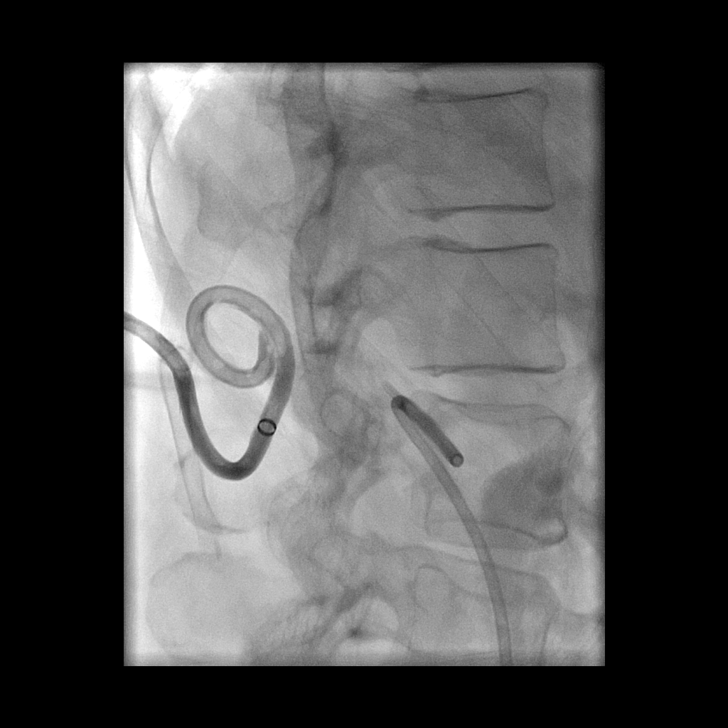

[Series 3: shuntogram · 3 of 23 frames shown (1 of 2)]
[frame 4/23]
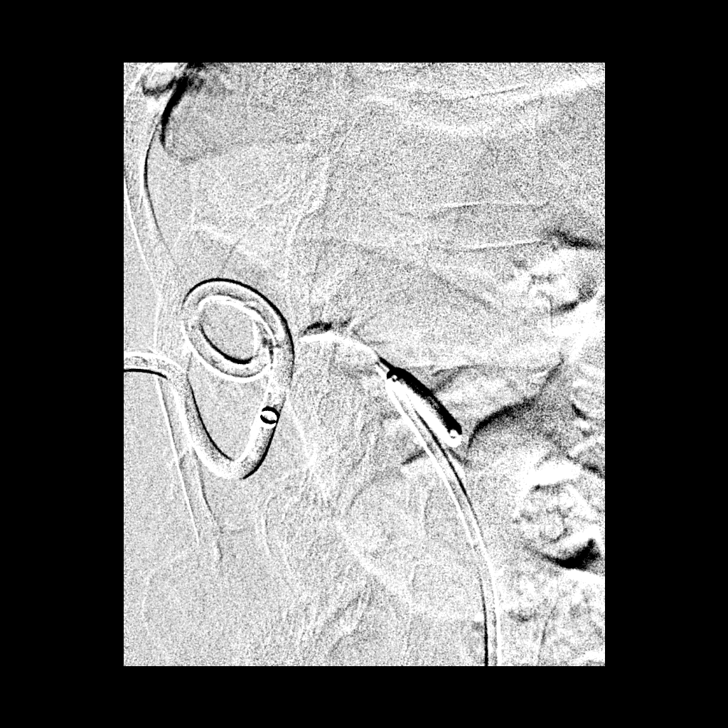
[frame 12/23]
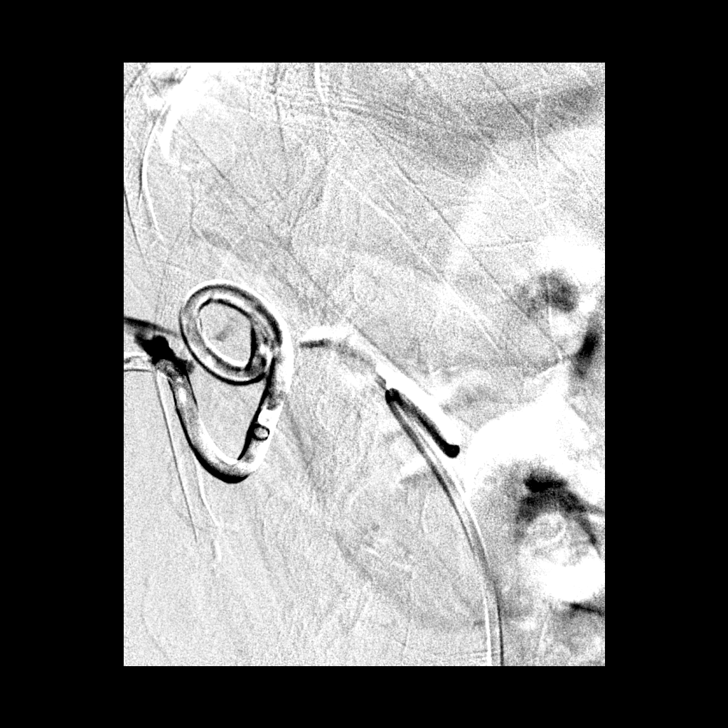
[frame 20/23]
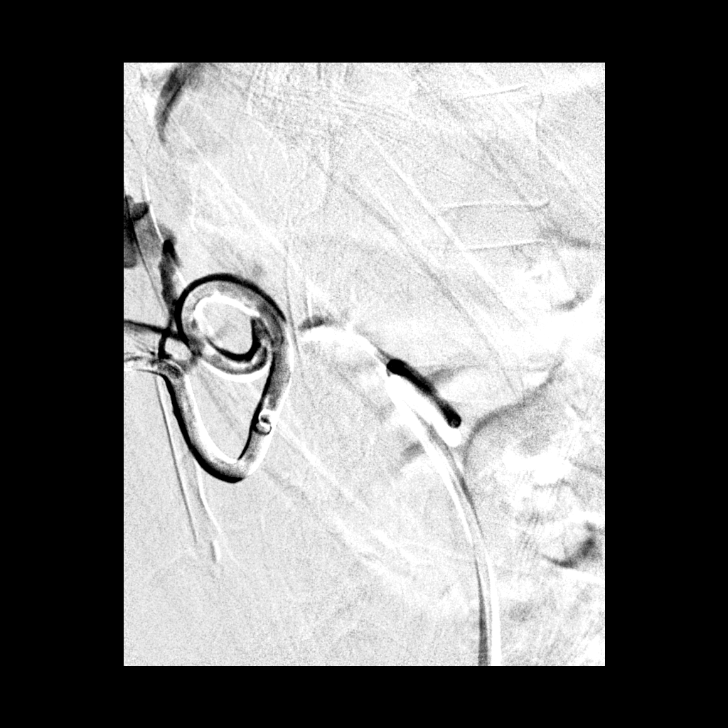

[Series 4: shuntogram · 4 of 21 frames shown (2 of 2)]
[frame 4/21]
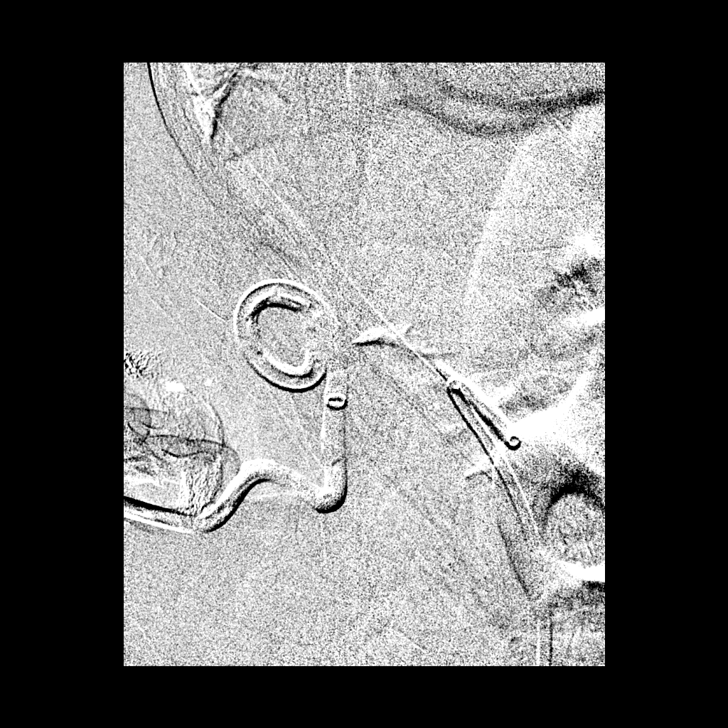
[frame 11/21]
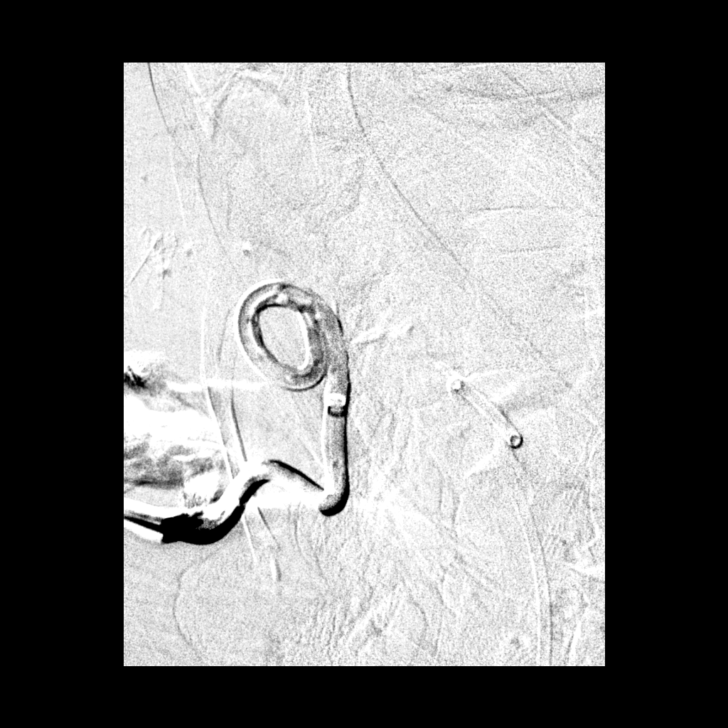
[frame 18/21]
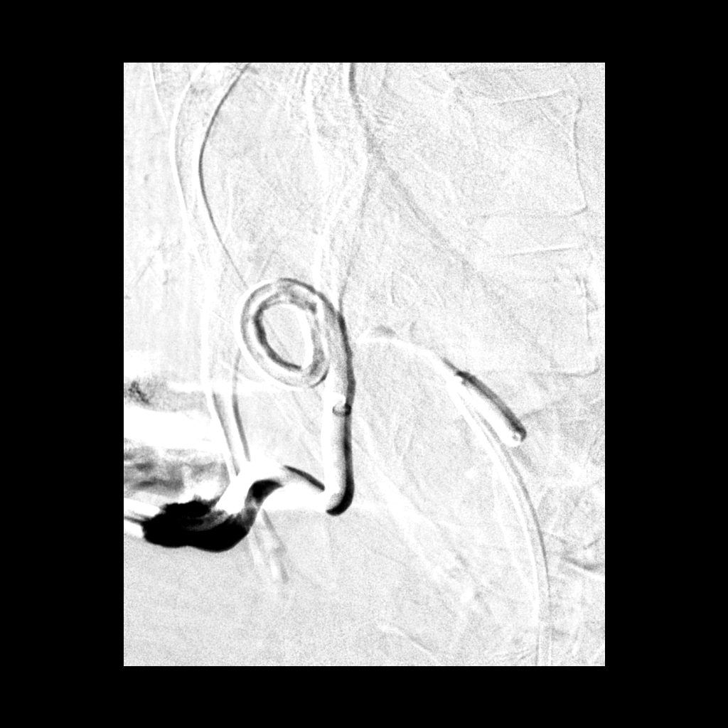
[frame 21/21]
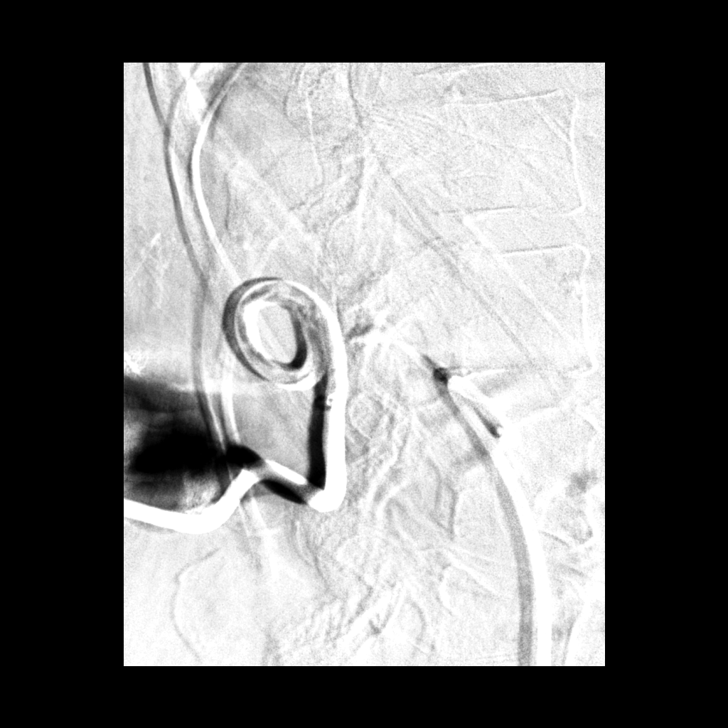

[Series 300: ir nephrostomy exchange right · 2 of 2 slices shown]
[im 1/2]
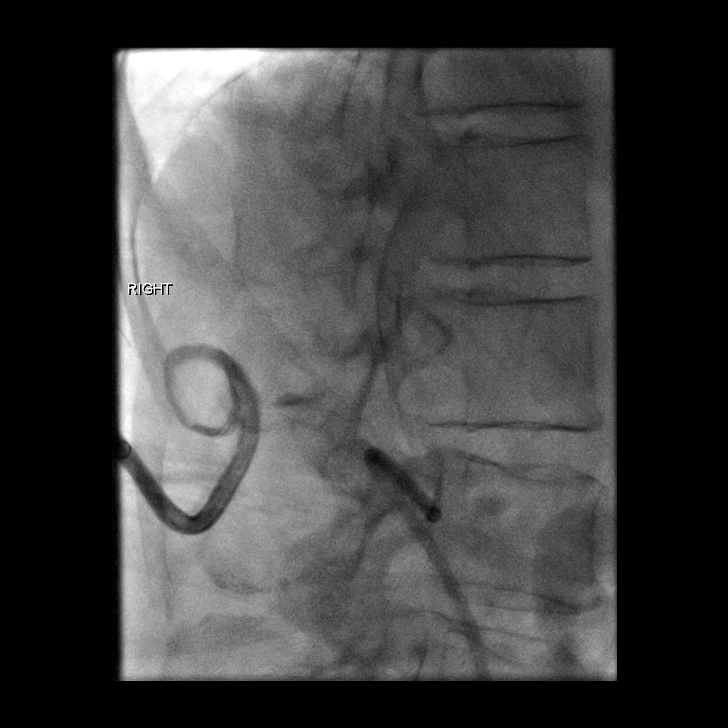
[im 2/2]
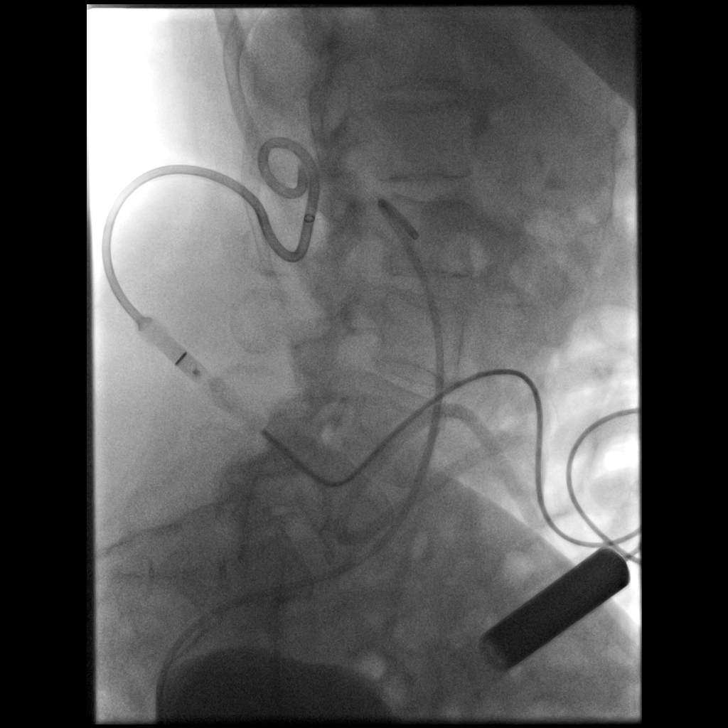

[10 of 10 positions shown; findings below may reference images not displayed]

EXAM:
IR NEPHROSTOGRAM EXISTING ACCESS RIGHT; NEPHROSTOMY REMOVAL

Date: 06/25/2014

PROCEDURE:
1. Contrast injection into existing perinephric drainage catheter
under fluoroscopy
2. Removal of drainage catheter with fluoroscopic guidance

ANESTHESIA/SEDATION:
None required

MEDICATIONS:
None

CONTRAST:  10 mL Omnipaque 300
A gentle hand injection of contrast material was performed through
the existing drainage catheter. No definite connection with the
adjacent renal collecting system. The digital subtraction
angiography was also performed for a hand injection of contrast
again confirming the absence of any fistulous connection with the
renal collecting system.

The tube was cut and removed over a wire under fluoroscopic
guidance.

COMPLICATIONS:
None
IMPRESSION: 1. No evidence of residual calyceal leak.
2. The right posterior perinephric drainage catheter was removed.

## 2015-12-25 DIAGNOSIS — Z1389 Encounter for screening for other disorder: Secondary | ICD-10-CM | POA: Diagnosis not present

## 2015-12-25 DIAGNOSIS — Z79899 Other long term (current) drug therapy: Secondary | ICD-10-CM | POA: Diagnosis not present

## 2015-12-25 DIAGNOSIS — E7800 Pure hypercholesterolemia, unspecified: Secondary | ICD-10-CM | POA: Diagnosis not present

## 2015-12-25 DIAGNOSIS — Z0000 Encounter for general adult medical examination without abnormal findings: Secondary | ICD-10-CM | POA: Diagnosis not present

## 2015-12-25 DIAGNOSIS — M810 Age-related osteoporosis without current pathological fracture: Secondary | ICD-10-CM | POA: Diagnosis not present

## 2015-12-25 DIAGNOSIS — Z23 Encounter for immunization: Secondary | ICD-10-CM | POA: Diagnosis not present

## 2015-12-25 DIAGNOSIS — E559 Vitamin D deficiency, unspecified: Secondary | ICD-10-CM | POA: Diagnosis not present

## 2015-12-25 DIAGNOSIS — A809 Acute poliomyelitis, unspecified: Secondary | ICD-10-CM | POA: Diagnosis not present

## 2015-12-28 ENCOUNTER — Other Ambulatory Visit: Payer: Self-pay

## 2015-12-28 NOTE — Patient Outreach (Signed)
[facility]) Care Management  12/28/2015  Marie Thomas 1945-02-11 [id number]     Telephone Screen  Referral Date: 12/25/15 Referral Source: MD office(Dr. Felipa Eth) Referral Reason: "needs someone to address safety at home"    Outreach attempt # 1 to patient. No answer at present. RN CM left HIPAA compliant voicemail message along with contact info.      Plan: RN CM will make outreach attempt to patient within a week if no return call from patient.   Enzo Montgomery, RN,BSN,CCM [place] Management Telephonic Care Management Coordinator Direct Phone: [phone] Toll Free: [phone] Fax: [phone]

## 2015-12-30 ENCOUNTER — Other Ambulatory Visit: Payer: Self-pay

## 2015-12-30 NOTE — Patient Outreach (Signed)
[place] [facility]) Care Management  12/30/2015  Marie Thomas February 13, 1945 [id number]   Telephone Screen  Referral Date: 12/25/15 Referral Source: MD office(Dr. Felipa Eth) Referral Reason: "needs someone to address safety at home"   Voicemail message received from patient. Return call placed to patient and screening completed.  Social: Patient resides in her home alone. She has a sister nearby. She reprots that it takes her while to perform tasks but she is independent with ADLs/IADLs. She drives herself to appts with her specialized adapted vehicel. She deneis any falls this year but has had some close calls. DME in the hoem include wheelchair ramp, wheelcahir(that patietn reprots is malfunctioning and needs replacing) and leg braces.   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 She is wheelchair bound. She reports that she is having trouble and can barely get in and out of her bathroom and trouble getting in and out of the tub. Patient desires to remain in her home as long as she can independently. She voices wanting some education and support in ways to maintain her safety and independence in the home. Patient also has history of HLD, osteoporosis and status post cryoablation of right renal neoplasm.  Medications: Patient reports she is currently not on any prescriptions meds. She reports she takes occassional OTC meds when needed.    Appointments: She saw PCP on 12/25/15. No other appts.  Advance Directives: She reports that she has living will and HCPOA.   Plan: RN CM will notify [facility] administrative assistant of case status. RN CM will send [facility] community referral for further in home safety eval and assessment. RN CM provided patient with [facility] 24hr Nurse Line contact info.     Enzo Montgomery, RN,BSN,CCM [place] Management Telephonic Care Management Coordinator Direct Phone:  [phone] Toll Free: [phone] Fax: [phone]

## 2015-12-31 ENCOUNTER — Ambulatory Visit: Payer: Self-pay

## 2016-01-01 ENCOUNTER — Encounter: Payer: Self-pay | Admitting: *Deleted

## 2016-01-01 ENCOUNTER — Other Ambulatory Visit: Payer: Self-pay | Admitting: *Deleted

## 2016-01-01 NOTE — Patient Outreach (Signed)
[facility]) Care Management [place] Telephone Outreach 01/01/2016  IVANA YUSUPOV [birth year] [id number]  Successful telephone outreach attempt to Germaine Pomfret, 70 y/o female referred to [place] by [facility] telephonic CM/ original PCP referral for in-home assessment of potential safety concerns and DME needs.  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  HIPAA/ identity verified and verbal consent for [place] services obtained during phone call today.  Today, Ms. Culbreath reports that she is doing "pretty good," and states that her biggest health challenge is navigating her home with her current wheelchair, reporting that her bathroom entryway is "too small" to fit in the bathroom without significant effort.  Patient reports that she used to use crutches and have leg braces, but is no longer able to use these as she has aged and has osteoarthritis, which is "worse during the winter months."  Patient reports that she has not experienced any falls this year, and would like to increase her strength and become more active in an effort to prevent falls.  Patient states that she would like to have a place to swim, as she has previously enjoyed swimming and found it very helpful in increasing her strength and overall state of health.  Patient also reports that she would like to explore options for having assistance "every week or two" with housekeeping/ laundry, as her sister who also has health problems is currently the only assistance she has with household needs.  Patient also stated that she would be open to having physical therapy to increase her strength and endurance.  Patient reports that she does not take any prescription medications and manages her  pain with over the counter medications as needed, when necessary, mostly from "osteoarthritis pain that comes and goes depending on the weather."  Patient reports that she is able to drive herself to appointments and errands, and states that she has to "flag someone down" in the parking lots to ask for assistance with getting her wheelchair out of her vehicle to complete her errands/ attend appointments when she is by herself.  [place] services were discussed with patient, and patient provided verbal consent for services/ in-home visit.  [place] in-home initial visit scheduled for later this week.  I provided patient with my direct phone number, the main [facility] CM office number, and the 24-hour nurse line phone number.  Patient denies further questions, concerns, issues or problems today.  Plan:  [place] initial home visit scheduled for later this month.   [name], RN, BSN, [employer] [facility] Care Management  [phone]

## 2016-01-13 ENCOUNTER — Encounter: Payer: Self-pay | Admitting: *Deleted

## 2016-01-13 ENCOUNTER — Other Ambulatory Visit: Payer: Self-pay | Admitting: *Deleted

## 2016-01-13 NOTE — Patient Outreach (Signed)
[facility]) Care Management  [place] Initial Home Visit 01/13/2016  Marie Thomas 03-15-1945 [id number]  Marie Thomas is a 70 y.o. female referred to [place] by [facility] telephonic CM/ original PCP referral for in-home assessment of potential safety concerns and DME needs.  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  Patient is essentially wheelchair bound due to bilateral LE paralysis.  HIPAA/ identity verified in person today at patient'Marie Thomas home and written consent for [facility] CM services obtained.  Today, Marie Thomas) reports that she is "doing well."  Marie Thomas reports that she has no serious chronic health issues, which is verified through review of EMR, and states that she has lived her life independently without the use of her legs due to childhood polio.  Marie Thomas currently has no prescribed medications and only takes OTC medications for health maintenance.  Patient reports that her biggest (physical) health issue is  "osteoarthritis pain that comes and goes depending on the weather," for which she occasionally takes Tylenol.  Marie Thomas shared that last year she had a hospitalization and lengthy illness related to a tumor that was found on her kidney.  Marie Thomas stated that her recuperation was difficult, and that she lost a lot of strength as she healed, stating, "that was a big set-back" for her.     Mobility/ DME needs:  Although patient currently primarily uses wheelchair for mobility, she has bilateral leg braces which she still uses occasionally, "about 2 times a week," as she is able.  Marie Thomas states that she used to work a full time job, (now retired) and wore her braces daily when working.  Marie Thomas would like to regain strength lost from her illness last year, "to remain as independent  as possible" as she ages.   Marie Thomas reports that she continues to drive and has a handicapped vehicle adapted with hand controls, and that she used to swim regularly which helped her "maintain strength and flexibility."     -- "likes" her current wheelchair and is used to it, states she purchased wheelchair "second-hand" 15 years ago, and the rubber on the wheels "is peeling off," stating that "it has lost several layers of the rubber it used to have, and the brakes don't always hold securely."  Current wheelchair is a "Breezy Ultra 4" brand.  -- parks her car out back door of home, where there is a wheelchair ramp; however, patient must wear her leg braces to get in and out of her car, and she cannot easily put the wheelchair in and out of the car while wearing her leg braces.  Patient states that when she wears her braces, she  has to "lie down on the ground and pull herself up into the car," which prevents her from attending appointments/ running errands if "the weather is at all bad."  Additionally, Marie Thomas states she must "flag someone down" in parking lots to ask for assistance with getting out of her car/ getting her wheelchair out of her vehicle to complete her errands/ attend appointments when she is by herself.  -- not interested in having electric/ motorized wheelchair/ scooter, due to the weight of the machines and her inability to use bilateral LE.  Additionally, she resides in an older "mill home" with narrow door frames.  -- unable to use a walker when her leg braces are on, as "  it throws her balance off and causes me to fall."  -- would like to begin swimming again, but states that she needs assistance getting in and out of the pool, and is unsure where she could go where this need could be accommodated.  -- is interested in receiving PT/ OT services to improve mobility and increase her effectiveness with household duties, if services are covered by insurance due to limited personal  funds.  -- currently does not have grab bars in bathroom to assist with mobility needs for bathing/ hygiene/ toileting needs; states mobility in bathroom is an issue, and will consider possibliity of adding grab bars, although she does not wish to significantly modify her home as her brother owns the home.  Community Resource needs:  Patient lives alone in her brother'Marie Thomas home, which he remodeled for patient'Marie Thomas use 3 years ago.  Ariena reports that she only pays utilities, and that her brother allows her to reside in home rent-free.  Candle states that she has a very supportive and involved family, (brother and sister) that live nearby and assist with her with her care needs as allowed.  Marie Thomas reports that both of her siblings have personal family obligations, as well chronic health issues, and are often unable to assist her with household needs, errands, transportation to provider appointments, meal preparation, etc.  Patient is very appreciative of the assistance she receives from her family and verbalizes that she does not want to "over-use" their help, as they are also aging and have their own health issues/ families to tend to.  -- Bathing issues:  Patient has been able to perform hygiene needs, but notes that this is becoming increasingly difficult due to navigation of wheelchair around layout of her home, decreased strength after 2016 illness.    -- Household [employer] issues:  Marie Thomas is unable to reach the controls on her stove to cook, and unable to reach the control knobs on her washer/ dryer to do laundry when she is in her wheelchair.  She currently primarily uses her microwave to prepare meals.  Sister asists with laundry as she is able.  -- Transportation issues:  parks her car out back door of home, where there is a wheelchair ramp; however, patient must wear her leg braces to get in and out of her car, and she cannot easily put the wheelchair in and out of the car while wearing  her leg braces.  Patient states that when she wears her braces, she  has to "lie down on the ground and pull herself up into the car," which prevents her from attending appointments/ running errands if "the weather is at all bad."  Additionally, Marie Thomas states she must "flag someone down" in parking lots to ask for assistance with getting out of her car/ getting her wheelchair out of her vehicle to complete her errands/ attend appointments when she is by herself.  There is no ramp located on front side of patient'Marie Thomas home.  Personal Care Assistance resources, transportation resources, and options for meal assistance briefly discussed with patient, and patient is agreeable to my placing a [facility] CSW referral for assessment of overall resource needs.   Prior to leaving Marie Thomas'Marie Thomas home, we contacted her insurance provider to obtain information on DME coverage.  Patient denies further questions, concerns, issues or problems today.   Subjective: "I am very used to being independent and as I am getting older, things are getting more challenging; I want to be prepared so I can stay  as independent as I possibly can."  Objective:    BP (!) 154/84   Pulse 80   Resp 16   SpO2 98%   Review of Systems  Constitutional: Negative.   Respiratory: Negative.   Cardiovascular: Negative.   Gastrointestinal: Negative.   Genitourinary: Negative.   Musculoskeletal: Negative.   Neurological: Negative.   Psychiatric/Behavioral: Negative.  Negative for depression and memory loss. The patient is not nervous/anxious.     Physical Exam  Constitutional: She is oriented to person, place, and time. She appears well-developed and well-nourished.  Cardiovascular: Normal rate, regular rhythm, normal heart sounds and intact distal pulses.   Respiratory: Effort normal and breath sounds normal. No respiratory distress.  GI: Soft. Bowel sounds are normal.  Musculoskeletal:  Patient is paralyzed below the waist and is  essentially wheelchair bound due to history of childhood polio  Neurological: She is alert and oriented to person, place, and time.  Skin: Skin is warm and dry.  Psychiatric: She has a normal mood and affect. Her behavior is normal. Judgment and thought content normal.    Encounter Medications:   Outpatient Encounter Prescriptions as of 01/13/2016  Medication Sig Note  . acetaminophen (TYLENOL) 500 MG tablet Take 500 mg by mouth as needed for mild pain.    . Cholecalciferol (VITAMIN D) 2000 UNITS tablet Take 2,000 Units by mouth daily.   Marland Kitchen docusate sodium (COLACE) 100 MG capsule Take 1 capsule (100 mg total) by mouth every 12 (twelve) hours. (Patient taking differently: Take 100 mg by mouth 2 (two) times daily as needed for moderate constipation. ) 01/13/2016: Taking as needed  . feeding supplement, ENSURE ENLIVE, (ENSURE ENLIVE) LIQD Take 237 mLs by mouth 2 (two) times daily between meals. 01/13/2016: Takes occasionally  . fluconazole (DIFLUCAN) 150 MG tablet Take 1 tablet (150 mg total) by mouth once. (Patient not taking: Reported on 01/13/2016) 01/13/2016: Completed this medication in 2016  . HYDROcodone-acetaminophen (NORCO) 5-325 MG per tablet Take 1-2 tablets by mouth every 4 (four) hours as needed for moderate pain. (Patient not taking: Reported on 01/13/2016) 01/13/2016: Patient completed this medication in 2016  . HYDROcodone-acetaminophen (NORCO/VICODIN) 5-325 MG per tablet Take 1-2 tablets by mouth every 6 (six) hours as needed for moderate pain or severe pain. (Patient not taking: Reported on 01/13/2016) 01/13/2016: Patient completed this medication in 2016   Facility-Administered Encounter Medications as of 01/13/2016  Medication  . gentamicin (GARAMYCIN) 260 mg in dextrose 5 % 100 mL IVPB    Functional Status:   In your present state of health, do you have any difficulty performing the following activities: 01/01/2016  Hearing? N  Vision? N  Difficulty concentrating or making  decisions? N  Walking or climbing stairs? Y  Dressing or bathing? N  Doing errands, shopping? N  Preparing Food and eating ? N  Using the Toilet? N  In the past six months, have you accidently leaked urine? N  Do you have problems with loss of bowel control? N  Managing your Medications? N  Managing your Finances? N  Housekeeping or managing your Housekeeping? Y  Some recent data might be hidden    Fall/Depression Screening:    PHQ 2/9 Scores 12/30/2015  PHQ - 2 Score 0    Assessment:  Marie Thomas has a unique living situation/ needs around her challenges in navigating her home/ overall life, which requires significant personal effort.   Marie Thomas reports that she has not experienced any falls this year, and would like to increase her  strength and become more active in an effort to prevent falls and remain independent.  Marie Thomas would like to have a place to swim, as she has previously enjoyed swimming and found it helpful in increasing her strength and overall state of health.  Marie Thomas would like to explore options for having assistance with housekeeping/ laundry/ meals/ transportation, as her supportive family is limited in their ability to assist.  Patient is interested in options to participate with physical/ occupational therapy to increase her strength and endurance.  Marie Thomas will consider her DME needs.  Marie Thomas is committed to maintaining as much independence as possible around her stated challenges.  Plan:   Marie Thomas will continue to take her OTC medications as they are prescribed and attend all scheduled provider appointments.  [name] will contact [place] to collaborate on patient'Marie Thomas DME needs and possible need for [facility] PT and OT services.  [name] will make referral to [facility] CSW, and patient will communicate community resource needs to [facility] CSW.  [name] will outreach to [place] to collaborate on options for patient to participate in a structured swimming program  through the [facility].  [place] will increase the number of times she applies her leg braces from 2 times per week to at least 3 times per week, and will record the days and lengths of times she wears her leg braces.  Marie Thomas will consider placement of grab bars by her toilet.  [name] will route note from today'Marie Thomas [facility] initial home visit to patient'Marie Thomas PCP.  [place] involvement in patient'Marie Thomas care will continue with scheduled telephone outreach in 2 weeks; telephone outreach sooner as indicated around care coordination efforts.   I appreciate the opportunity to participate in [place]'Marie Thomas care,   [name], RN, BSN, [place] Coordinator [facility] Care Management  [phone]

## 2016-01-14 ENCOUNTER — Other Ambulatory Visit: Payer: Self-pay | Admitting: Licensed Clinical Social Worker

## 2016-01-14 ENCOUNTER — Encounter: Payer: Self-pay | Admitting: Licensed Clinical Social Worker

## 2016-01-14 ENCOUNTER — Other Ambulatory Visit: Payer: Self-pay | Admitting: *Deleted

## 2016-01-14 NOTE — Patient Outreach (Signed)
[facility] [facility]) Care Management [place] Telephone Outreach, Care Coordination 01/14/2016  Marie Thomas 1945/09/21 [id number]  Unsuccessful telephone outreach for care coordination to Darlina Guys, Specialist with [place] [phone]) re: Marie Thomas is a 70 y.o. female referred to [place] by [facility] telephonic CM/ original PCP referral for in-home assessment of potential safety concerns and DME needs. 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 Patient is essentially wheelchair bound due to bilateral LE paralysis, and has specific and unique needs around home DME needs and possible initiation of home health [facility]) services for PT/OT.  Call today was placed to obtain information on possible [facility] assistance for unique patient needs.  Detailed voicemail message left for Melissa, asking for return call back.  Plan:  Will re-attempt contact with Darlina Guys, Specialist with [place] next week if I do not hear back from [facility] before then.  [name], RN, BSN, [employer] [facility] Care Management  [phone]

## 2016-01-14 NOTE — Patient Outreach (Signed)
[facility]) Care Management [place] Telephone Outreach, Care Coordination 01/14/2016  [name] 1945/05/25 [id number]   Successful incoming telephone outreach for care coordination from Darlina Guys, Specialist with [facility]) [phone]) re: Marie Thomas a 70 y.o.femalereferred to [place] by [facility] telephonic CM/ original PCP referral for in-home assessment of potential safety concerns and DME needs. 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 Patient is essentially wheelchair bound due to bilateral LE paralysis, and has specific and unique needs around home DME needs and possible initiation of home health [facility]) services for PT/OT.    Call earlier today was placed to obtain information on possible [facility] assistance for unique patient needs, and Melissa returned my call this afternoon.  During phone call this afternoon, I explained unique patient situation/ needs to [facility], and she confirmed that there is not staff available at [facility] to specifically assess patient's home environment for DME without involving PT/OT services.  Melissa further confirmed that patient's physician must place order for wheelchair DME need, and any needed [facility] services, and that once involved, HH PT/OT will do assessment to determine duration of ordered [facility] services.  Melissa confirmed that bathroom DME such as grab bars do not require physician order and would be handled by [facility] retail department.   Plan:  Will contact patient's PCP to obtain [facility] orders next week, as patient requested that services start after NYD holiday.  I appreciate Melissa's collaboration efforts to ensure patient's care needs are met.  [name], RN, BSN, [employer] [facility] Care Management  [phone]

## 2016-01-14 NOTE — Patient Outreach (Signed)
[facility]) Care Management  01/14/2016  DOROTHE MITTEN February 22, 1945 [id number]   Assessment- CSW received new referral on 01/14/16. CSW completed call to [place] to receive updates on patient and discuss resource needs. Patient is in need of possible meal assistance, personal care resources and transportation. CSW completed initial outreach to patient and she answered. HIPPA verifications were provided. CSW introduced self, reason for call and of [facility] social work services. Patient reports that she is appreciative of [facility] assistance and is agreeable to home visit next week.   Plan-CSW will send involvement letter to PCP. CSW will complete home visit on 01/19/15.  [name], BSW, MSW, [email or web address] Phone: [phone] Fax: [phone]

## 2016-01-19 ENCOUNTER — Other Ambulatory Visit: Payer: Self-pay | Admitting: Licensed Clinical Social Worker

## 2016-01-19 ENCOUNTER — Other Ambulatory Visit: Payer: Self-pay | Admitting: *Deleted

## 2016-01-19 NOTE — Patient Outreach (Signed)
[place] [facility]) Care Management [place] Telephone Outreach, Care Coordination 01/19/2016  Marie Thomas [birth year] [id number]   Unsuccessful telephone outreach for care coordination to Marie Thomas, medical assistant to Dr. Felipa Eth re: Marie Thomas a 71 y.o.femalereferred to [place] by [facility] telephonic CM/ original PCP referral for in-home assessment of potential safety concerns and DME needs. 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 Patient is essentially wheelchair bound due to bilateral LE paralysis, and has specific and unique needs around home DME needs and possible initiation of home health [facility]) services for PT/OT.  Call today was placed to request order from Dr. Felipa Eth for [facility] PT/OT assistance for unique patient needs.  Detailed voicemail message left for Marie Thomas, asking for return call back.  Plan:  Will await follow up call from Dr. Carlyle Lipa staff to coordinate [facility] services.   [name], RN, BSN, [employer] [facility] Care Management  [phone]

## 2016-01-19 NOTE — Patient Outreach (Addendum)
[facility] [facility]) Care Management  [facility] Social Work  01/19/2016  Marie Thomas Aug 26, 1945 [id number]   Encounter Medications:  Outpatient Encounter Prescriptions as of 01/19/2016  Medication Sig Note  . acetaminophen (TYLENOL) 500 MG tablet Take 500 mg by mouth as needed for mild pain.    . Cholecalciferol (VITAMIN D) 2000 UNITS tablet Take 2,000 Units by mouth daily.   Marland Kitchen docusate sodium (COLACE) 100 MG capsule Take 1 capsule (100 mg total) by mouth every 12 (twelve) hours. (Patient taking differently: Take 100 mg by mouth 2 (two) times daily as needed for moderate constipation. ) 01/13/2016: Taking as needed  . feeding supplement, ENSURE ENLIVE, (ENSURE ENLIVE) LIQD Take 237 mLs by mouth 2 (two) times daily between meals. 01/13/2016: Takes occasionally  . fluconazole (DIFLUCAN) 150 MG tablet Take 1 tablet (150 mg total) by mouth once. (Patient not taking: Reported on 01/13/2016) 01/13/2016: Completed this medication in 2016  . HYDROcodone-acetaminophen (NORCO) 5-325 MG per tablet Take 1-2 tablets by mouth every 4 (four) hours as needed for moderate pain. (Patient not taking: Reported on 01/13/2016) 01/13/2016: Patient completed this medication in 2016  . HYDROcodone-acetaminophen (NORCO/VICODIN) 5-325 MG per tablet Take 1-2 tablets by mouth every 6 (six) hours as needed for moderate pain or severe pain. (Patient not taking: Reported on 01/13/2016) 01/13/2016: Patient completed this medication in 2016   Facility-Administered Encounter Medications as of 01/19/2016  Medication  . gentamicin (GARAMYCIN) 260 mg in dextrose 5 % 100 mL IVPB    Functional Status:  In your present state of health, do you have any difficulty performing the following activities: 01/01/2016  Hearing? N  Vision? N  Difficulty concentrating or making decisions? N  Walking or climbing stairs? Y  Dressing or bathing? N  Doing errands, shopping? N  Preparing Food and eating ? N  Using the Toilet? N  In  the past six months, have you accidently leaked urine? N  Do you have problems with loss of bowel control? N  Managing your Medications? N  Managing your Finances? N  Housekeeping or managing your Housekeeping? Y  Some recent data might be hidden    Fall/Depression Screening:  PHQ 2/9 Scores 01/19/2016 12/30/2015  PHQ - 2 Score 0 0    Assessment: CSW completed home visit on 01/19/16 at patient's residence. 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 Patient is in need of community resources and is agreeable to social work assistance. CSW has discussed case with [facility] RNCM. RNCM feels that patient is in need of meal assistance, personal care resources and transportation resources. Patient is paralyzed from the waist down and uses both leg braces and manual wheelchair to navigate. Patient has a very supportive family. She lives in her brother and sister in [place] home that they own (but they do not live there) and patient pays for utilities only. Her brother and brother in law have made certain renovations to the home to accommodate her. Patient's younger sister [name] is her HCPOA and comes to her residence once per week to assist. Patient admits to having little to none socialization. Patient lost her dog a few years ago and is looking to gain another dog. She reports that she lives near [facility] and would be willing to go if she were to gain stable transportation. CSW provided list of basic Senior Resources within [facility] which includes information on [employer]. CSW provided  education on [employer] and SCAT. Patient is agreeable to CSW assisting patient with SCAT application. CSW completed SCAT application and will fax it to [place] office by 01/22/16. Patient was educated on [employer] and is agreeable to referral. She was also educated on their current wait list. Patient was also  educated on the [place] and their current grant program to help with renovations to help those with disabilities. Patient was provided information on their program and their contact number. Patient is not interested in gaining renovations in her bathroom but would like information on whether or not they could assist with things that are out of her reach (kitchen drawers, oven button and laundry machine buttons that her reacher cannot assist her with.) CSW left a message with center and requested return call. CSW is unsure if patient will need to meet certain financial eligibility requirements for their grant program. Patient is not willing to provide her personal monthly income. However, she informs CSW that she is not eligible for Full Adult Medicaid. CSW also provided education on personal care resources that include: Private pay agencies, Community [facility] and [employer] and In [place]. Patient is agreeable to CSW making referrals to both [facility] and In [place] and is aware of their wait list (8-12 months for [facility] and 2 years for In [employer].) Patient states that she would be able to pay for a caregiver but does not wish to pay more than $12.00 per hour. CSW encouraged patient to network in the community and see if she can find an aide that is not attached to an agency (most private pay agencies are $17.50 per hour at this time) who would be willing to provide her personal care services at an affordable price. Patient reports that she uses the Internet and will start her search.   CSW will place patient on wait list for [employer]. CSW will place patient on wait list for [facility]. CSW will place patient on wait list for In [place]. CSW will fax completed SCAT application by [id number].  Plan: CSW will route encounter to PCP. CSW completed referrals to all requested programs except [employer]. CSW unable to reach [employer] but will  try again. CSW will fax SCAT application by [id number]. CSW will await to hear back from [place]. CSW will follow up within 3 weeks.  [facility] CM Care Plan Problem One   Flowsheet Row Most Recent Value  Care Plan Problem One  Lack of community resouces and support  Role Documenting the Problem One  Clinical Social Worker  Care Plan for Problem One  Active  THN Long Term Goal (31-90 days)  Within the next 90 days, patient will be able to verbalize [employer] to assist with transportation, mobility, meal assistance and personal care resources as evidenced by patient's self report to [place] Term Goal Start Date  01/14/16  Interventions for Problem One Long Term Goal  CSW will complete home visit and will provide education on needed resources. CSW will make referrals as needed. CSW will provide handouts for patient to keep. CSW will provide ongoing review as needed.  THN CM Short Term Goal #1 (0-30 days)  Within the next 30 days, patient will schedule SCAT assessment appointment as evidenced by self report to CSW  [facility] CM Short Term Goal #1 Start Date  01/19/16  Interventions for Short Term Goal #1  CSW completed SCAT application and provided education on their program. CSW will fax completed application to program and coordinate as needed.  THN CM Short Term Goal #2 (0-30 days)  Within the next 30 days, patient will schedule her in home assessment with [employer] as evidenced by self report to CSW  [facility] CM Short Term Goal #2 Start Date  01/19/16  Interventions for Short Term Goal #2  CSW completed review on [employer] program and their wait list and completed referral to program. CSW will coordinate care as needed.     [name], BSW, MSW, [email or web address] Phone: [phone] Fax: [phone]

## 2016-01-19 NOTE — Patient Outreach (Signed)
[place] [facility]) Care Management [place] Telephone Outreach, Care Coordination 01/19/2016  Marie Thomas 16-Dec-1945 [id number]  Successful incoming telephone outreach for care coordination to Marie Thomas, medical assistant to Dr. Michell Heinrich: Lina Sar Culbrethis a 71 y.o.femalereferred to [place] by [facility] telephonic CM/ original PCP referral for in-home assessment of potential safety concerns and DME needs. 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 Patient is essentially wheelchair bound due to bilateral LE paralysis, and has specific and unique needs around home DME needs and possible initiation of home health [facility]) services for PT/OT.   Call today was placed earlier today to request order from Dr. Felipa Eth for [facility] PT/OT assistance for unique patient needs. During our phone call today, Marie Thomas confirmed that Dr. Felipa Eth was agreeable to placing [facility] order for PT and OT services, and that order would be placed today by Dr. Felipa Eth.  Plan:  Will outreach by telephone to patient to communicate plan for initiation of [place] services this week.   Marie Rack, RN, BSN, [employer] [facility] Care Management  [phone]

## 2016-01-20 ENCOUNTER — Other Ambulatory Visit: Payer: Self-pay | Admitting: Licensed Clinical Social Worker

## 2016-01-20 ENCOUNTER — Encounter: Payer: Self-pay | Admitting: Interventional Radiology

## 2016-01-20 NOTE — Patient Outreach (Signed)
[place] [facility]) Care Management  01/20/2016  Marie Thomas Sep 03, 1945 [id number]   Assessment- CSW successfully faxed completed SCAT application to SCAT office on 01/20/16.   Plan-CSW will follow up within two weeks with patient and coordinate with SCAT if needed.  [name], BSW, MSW, [email or web address] Phone: [phone] Fax: [phone]

## 2016-01-20 NOTE — Patient Outreach (Signed)
[place] [facility]) Care Management  01/20/2016  VAIL LUMBRA Apr 25, 1945 [id number]   Assessment- CSW has been unable to reach [place] in order to place [employer] referral. When CSW attempted outreach to [facility] yesterday, number was not working. CSW completed additional attempt today and was able to reach staff member who stated that [employer] moved locations yesterday which is why their phone line was not working. She is unable to transfer call to Senior Line at this time to make referral and wrote down message from [place] with [employer] who will contact CSW back.  Plan-CSW will await return call from [employer] in order to complete referral.  [name], BSW, MSW, [email or web address] Phone: [phone] Fax: [phone]

## 2016-01-21 ENCOUNTER — Other Ambulatory Visit: Payer: Self-pay | Admitting: *Deleted

## 2016-01-21 ENCOUNTER — Encounter: Payer: Self-pay | Admitting: *Deleted

## 2016-01-21 ENCOUNTER — Other Ambulatory Visit: Payer: Self-pay | Admitting: Licensed Clinical Social Worker

## 2016-01-21 NOTE — Patient Outreach (Signed)
[place] [facility]) Care Management  01/21/2016  ASMAA DERCOLE 1945/06/24 [id number]   Assessment- CSW received return call from [place] and was able to complete referral to program. [facility] is unable to state how long the wait is. Patient will be contacted by [employer] social worker within the next week to schedule home assessment.  Plan-CSW will follow up within the next two weeks.  [name], BSW, MSW, [email or web address] Phone: [phone] Fax: [phone]

## 2016-01-21 NOTE — Patient Outreach (Signed)
[place] [facility]) Care Management [place] Telephone Outreach 01/21/2016  Marie Thomas [birth year] [id number]  Successful telephone outreach to Marie Thomas, 71 y.o.femalereferred to [place] by [facility] telephonic Thomas/ original PCP referral for in-home assessment of potential safety concerns and DME needs. 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 Patient is essentially wheelchair bound due to bilateral LE paralysis, and has specific and unique needs around home DME needs and possible initiation of home health [place] [facility]) services for PT/OT.  HIPAA verified with patient by phone today.    Call was placed today to confirm with Marie Thomas that I had communicated with PCP Marie Thomas, who agreed to order [facility] PT/OT services for patient.  Informed Marie Thomas to expect a follow up call from [facility] agency to initiate services.  Marie Thomas verbalized understanding and denied questions about [place] services.  Difference between [facility] services and [place] [facility] Thomas services discussed with patient during phone call today.  Marie Thomas asked if Marie Thomas could set her up with a Life-Alert system in case of emergency at home, as she lives alone.  I encouraged patient to contact her insurance provider to start with to obtain coverage information on in-home medical alert system, and she agreed to do so.  Marie Thomas denies further questions, concerns, issues or problems today, and confirmed that she is actively working with Marie Thomas regarding [employer] options.  We agreed that I would contact patient by telephone next week for update, or that patient would contact me prior if she had questions or concerns arise.  I confirmed that patient has my direct contact information, the main office number for [facility], and the [facility] 24-hour nurse advice line phone  numbers.    Plan:   Marie Thomas will continue to take her OTC medications as they are prescribed and attend all scheduled provider appointments.  Marie Thomas will contact her insurance company to obtain information on coverage of in-home medical alert system.  Marie Thomas will communicate/ work with Marie Thomas PT/ OT services as ordered by Marie Thomas.  Marie Thomas will outreach to [place] to collaborate on options for patient to participate in a structured swimming program through the [facility].  Marie Thomas will increase the number of times she applies her leg braces from 2 times per week to at least 3 times per week, and will record the days and lengths of times she wears her leg braces.  Marie Thomas will consider placement of grab bars by her toilet.  [name] involvement in patient's care will continue with scheduled telephone outreach next week; telephone outreach sooner as indicated around care coordination efforts.    Marie Rack, RN, BSN, [employer] [facility] Care Management  [phone]

## 2016-01-22 ENCOUNTER — Other Ambulatory Visit: Payer: Self-pay | Admitting: Licensed Clinical Social Worker

## 2016-01-22 NOTE — Patient Outreach (Addendum)
[place] [facility]) Care Management  01/22/2016  Marie Thomas 02/09/1945 [id number]   Assessment-  CSW received incoming call from [place]. Marie Thomas reports that they have 1 more opening for their grant program. She states that there are some financial eligibility requirements for program. CSW informed Marie Thomas that she does not have patient's monthly income. Marie Thomas shares that there program will be ending soon and if patient is still interested in program then she will need to contact her. CSW completed outreach to patient. Patient answered. CSW provided update. Patient seems hesitant about program but states that she will discuss it with her brother. CSW provided contact number for [place] again. Patient states that she has questions about programs and their wait times. CSW informed her that [employer] wait time is almost a year as well as CHRP. CSW reminded patient that In [facility] is a 2 year wait. CSW educated patient on SCAT and their process and stated that there is no current wait for program. Patient appreciative of review of resources. Patient reports that she did contact a CNA who works for herself but was informed that she has no openings at this time but she will keep her in mind for services in the future. CSW encouraged patient to continue to network in the community in order to find a caregiver that fits her needs.   Plan-CSW will follow up within three weeks and continue to provide social work assistance.  Marie Thomas, BSW, MSW, [email or web address] Phone: [phone] Fax: [phone]

## 2016-01-25 ENCOUNTER — Other Ambulatory Visit: Payer: Self-pay | Admitting: *Deleted

## 2016-01-25 ENCOUNTER — Encounter: Payer: Self-pay | Admitting: *Deleted

## 2016-01-25 NOTE — Patient Outreach (Signed)
[place] [facility]) Care Management [place] Telephone Outreach, Care Coordination 01/25/2016  Marie Thomas 06-03-1945 [id number]  Unsuccessful outgoingtelephone outreach for care coordination to [place], medical assistant to Dr. Felipa Eth ([id number]: [name], 71 y.o.femalereferred to [place] by [facility] telephonic CM/ original PCP referral for in-home assessment of potential safety concerns and DME needs. 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 Patient is essentially wheelchair bound due to bilateral LE paralysis, and has specific and unique needs around home DME needs and possible initiation of home health [facility]) services for PT/OT.   Call was placed previously on 01/19/16 to request order from Dr. Felipa Eth for [facility] PT/OT assessment of/ assistance for unique patient needs. During phone call on 01/19/16, [place] had confirmed that Dr. Felipa Eth was agreeable to placing [facility] order for PT and OT services, and that order would be placed on 01/19/16 by Dr. Felipa Eth.  Call was placed today to obtain clarification and follow up on [facility] services order from 01/19/16, as patient reported earlier this morning that [facility] agency has not yet contacted her for services to begin.  Requested call back from [place] to clarify.  Plan:  Will await follow up call from [place], assistant to Dr. Felipa Eth, regarding order for [facility] PT/OT services, and will communicate plan to patient afterward.  [name], RN, BSN, [employer] [facility] Care Management  [phone]

## 2016-01-25 NOTE — Patient Outreach (Signed)
[place] [facility]) Care Management [place] Telephone Outreach 01/25/2016  Marie Thomas [birth year] [id number]   Successful telephone outreach to Marie Thomas, 71 y.o.femalereferred to [place] by [facility] telephonic CM/ original PCP referral for in-home assessment of potential safety concerns and DME needs. 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 Patient is essentially wheelchair bound due to bilateral LE paralysis, and has specific and unique needs around home DME needs and possible initiation of home health [facility]) services for PT/OT.  HIPAA verified with patient by phone today.    Call was placed today to inquire with Marie Thomas if she had heard yet from [facility] PT/OT services as ordered by Dr. Felipa Eth on 01/20/16.  Marie Thomas shared today that she has not yet heard from [facility] PT/OT services.  I communicated with patient that I would again reach out to Dr. Felipa Eth, and would contact her after receiving additional information regarding start of [facility] services.   Marie Thomas also shared that she has not yet had time to contact her insurance provider regarding coverage of medical alert system; however, she voiced numerous questions about a notification that she received this weekend about her insurance provider not covering anesthesia for a medical procedure she had in Spring of 2016.  I advised Marie Thomas to contact her insurance provider for additional information of their specific coverage plan, which she agreed to do.  Marie Thomas stated that she has been unable to increase the number of times that she is applying her leg braces, as we had discussed during [facility] CM initial home visit, "as it has just been too cold."  Marie Thomas stated that she would begin increasing this activity "as soon as it gets a little warmer."  Marie Thomas denies further  questions, concerns, issues, or problems today, and I confirmed that patient has my direct contact information, the main office number for [facility] CM, and the [facility] 24-hour nurse advice line phone numbers.    Plan:  Will again outreach by to Dr. Felipa Eth regarding plan for initiation of [facility] services for PT/OT, and will communicate follow up with patient as available.  Marie Thomas will continue to take her OTC medications as they are prescribed and attend all scheduled provider appointments.  Marie Thomas will contact her insurance company to obtain information on coverage of in-home medical alert system.  Marie Thomas will communicate/ work with [facility] PT/ OT services as ordered by Dr. Felipa Eth.  [name] will outreach to [place] to collaborate on options for patient to participate in a structured swimmingprogram through the [facility].  Marie Thomas will increase the number of times she applies her leg braces from 2 times per week to at least 3 times per week, and will record the days and lengths of times she wears her leg braces.  Marie Thomas will consider placement of grab bars by her toilet.  Marie Rack, RN, BSN, [employer] [facility] Care Management  [phone]

## 2016-01-28 ENCOUNTER — Other Ambulatory Visit: Payer: Self-pay | Admitting: *Deleted

## 2016-01-28 NOTE — Patient Outreach (Signed)
[facility]) Care Management Marie Thomas Telephone Outreach, Care Coordination 01/28/2016  Marie Thomas 1945/02/01 [id number]  Successful incomingtelephone outreach for care coordination from [place], medical assistant to Dr. Felipa Eth ([id number]: Marie Thomas,70 y.o.femalereferred to [place] by [facility] telephonic CM/ original PCP referral for in-home assessment of potential safety concerns and DME needs. 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 Patient is essentially wheelchair bound due to bilateral LE paralysis, and has specific and unique needs around home DME needs and possible initiation of home health [facility]) services for PT/OT.   Call was placed previously on 1/02/18to request order from Dr. Felipa Eth for [facility] PT/OT assessment of/ assistance for unique patient needs. During phone call on 01/19/16, [place] hadconfirmed that Dr. Felipa Eth was agreeable to placing [facility] order for PT and OT services, and that order would be placed on 1/02/18by Dr. Felipa Eth.  Call was placed earlier today to obtain clarification and follow up on [facility] services order from 01/19/16, as patient reported earlier this week that [facility] agency has not yet contacted her for services to begin.   During successful telephone outreach now, [place] confirmed that she has re-ordered [facility] services for PT/ OT through [place] today.  Plan:  Will follow up with scheduled telephone outreach to patient next week to ensure that [facility] PT/OT services have arrived.  [name], RN, BSN, [employer] [facility] Care Management  [phone]

## 2016-01-28 NOTE — Patient Outreach (Signed)
[place] [facility]) Care Management [place] telephone Outreach, Care Coordination 01/28/2016  Marie Thomas [birth year] [id number]  Unsuccessful outgoingtelephone outreach for care coordination to [place], medical assistant to Dr. Felipa Eth ([id number]: [name], 71 y.o.femalereferred to [place] by [facility] telephonic CM/ original PCP referral for in-home assessment of potential safety concerns and DME needs. 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 Patient is essentially wheelchair bound due to bilateral LE paralysis, and has specific and unique needs around home DME needs and possible initiation of home health [facility]) services for PT/OT.   Call was placed previously on 01/19/16 to request order from Dr. Felipa Eth for [facility] PT/OT assessment of/ assistance for unique patient needs. During phone call on 01/19/16, [place] had confirmed that Dr. Felipa Eth was agreeable to placing [facility] order for PT and OT services, and that order would be placed on 01/19/16 by Dr. Felipa Eth.  Call was placed 01/25/16 and again today to obtain clarification and follow up on [facility] services order from 01/19/16, as patient reported earlier this week that [facility] agency has not yet contacted her for services to begin.  Requested call back from [place] to clarify.  Plan:  Will await follow up call from [place], assistant to Dr. Felipa Eth, regarding order for [facility] PT/OT services, and will communicate plan to patient afterward.  [name], RN, BSN, [employer] [facility] Care Management  [phone]

## 2016-01-29 ENCOUNTER — Other Ambulatory Visit: Payer: Self-pay | Admitting: Licensed Clinical Social Worker

## 2016-01-29 NOTE — Patient Outreach (Signed)
[place] [facility]) Care Management  01/29/2016  CEASIA DEFRAIN 26-Sep-1945 [id number]  Assessment- CSW received incoming call from patient. She reports that [facility] has contacted her and schedule a home assessment for 02/11/16 at 11:00 am. She reports that they will be doing a home assessment instead of her going to their office so that they can evaluate if the SCAT bus will be able to go through the side entryway to the back of her house in order for her to use her ramp (which is at the back of her home not the front.) Patient is aware that there is a chance the SCAT bus will not be able to do this. Patient reports that she is doing well today and just wanted to update CSW. CSW questioned if patient has heard from [employer] and she declined. CSW made referral on 01/21/16. CSW will follow up within 2 weeks.  Plan-CSW will follow up within two weeks.  [name], BSW, MSW, [email or web address] Phone: [phone] Fax: [phone]

## 2016-02-02 ENCOUNTER — Other Ambulatory Visit: Payer: Self-pay | Admitting: *Deleted

## 2016-02-02 ENCOUNTER — Encounter: Payer: Self-pay | Admitting: *Deleted

## 2016-02-02 NOTE — Patient Outreach (Signed)
[place] [facility]) Care Management [place] Telephone Outreach 02/02/2016  Marie Thomas 06-17-1945 [id number]  Successful telephone outreach to Marie Thomas y.o.femalereferred to [place] by [name] referral for in-home assessment of potential safety concerns and DME needs. 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 HIPAA verified with patient by phone today.   Call was placed today to inquire with Marie Thomas if she had heard yet from [facility] PT/OT services as originally ordered by Dr. Felipa Eth on 01/20/16, and then re-ordered on 01/28/16. Marie Thomas shared today that she heard from "[name], PT" with [place] on Sunday 01/31/16 by phone.  Patient said that she "was caught off-guard" as she was not expecting a phone call over the weekend.  Marie Thomas reported that she did not know the status of [name] services, as the person she spoke to on Sunday 01/31/16 told her that she "did not know very much about helping with a wheelchair."  Marie Thomas confirmed that she was still interested in [facility] PT, but did not know when to expect to hear back about initiation of services for PT/OT.  Marie Thomas also reported that she has been able to increase the number of times that she is applying her leg braces, as we had discussed during [name] initial home visit, and had applied her leg braces two times over the last week.  Marie Thomas stated that she would continue trying to increase the number of times she applies her leg braces.  Marie Thomas stated that she had heard back from [facility] transportation services and that a [facility]AT  assessment visit was scheduled at her home on 02/11/16.   Marie Thomas denies further questions, concerns, issues, or problems today, and I confirmed that patient has my direct contact information, the main office number for [facility] Thomas, and the [facility] 24-hour nurse advice line phone numbers.    Plan:  Will again outreach to leadership at [place] agency regarding plan for initiation of [facility] services for PT/OT, and will communicate follow up with patient as available.  Marie Thomas will continue to take her OTC medications as they are prescribed and attend all scheduled provider appointments.  Marie Thomas will contact her insurance company to obtain information on coverage of in-home medical alert system.  Marie Thomas will communicate/ work with Marie Thomas PT/ OT services as ordered by Dr. Felipa Eth.  Marie Thomas will outreach to [place] to collaborate on options for patient to participate in a structured swimmingprogram through the [facility].  Marie Thomas will increase the number of times she applies her leg braces from 2 times per week to at least 3 times per week, and will record the days and lengths of times she wears her leg braces.  Marie Thomas will consider placement of grab bars by her toilet.  Marie Rack, RN, BSN, [employer] [facility] Care Management  [phone]

## 2016-02-05 ENCOUNTER — Other Ambulatory Visit: Payer: Self-pay | Admitting: *Deleted

## 2016-02-05 ENCOUNTER — Encounter: Payer: Self-pay | Admitting: *Deleted

## 2016-02-05 NOTE — Patient Outreach (Signed)
[place] [facility]) Care Management [place] Telephone Outreach x 2  02/05/2016  Marie Thomas Apr 02, 1945 [id number]  Initial call attempt unsuccessful; HIPAA compliant voice mail message left for patient, requesting call back.  Patient returned call within 30 minutes of original outreach.  Successful telephone outreach to Marie Thomas y.o.femalereferred to [place] by [facility] telephonic CM/ original PCP referral for in-home assessment of potential safety concerns and DME needs. 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  Call was placed earlier today from [facility] regarding follow up plan to address DME needs.  I contacted [facility]) regarding obtaining new DME for wheelchair and spoke with [name] about patient's unique needs for DME; see previous care coordination note from today.  Provided update/ information to patient based on care coordination call placed to [facility] today.  Provided patient with number for [facility] referrals department so she could contact them to clarify her current insurance provider.  Marie Thomas was appreciative of callback/ information, and verbalized plans to contact [facility] to clarify her current insurance provider.  Marie Thomas and I agreed that she would contact me by phone should she have issues or questions arise; otherwise, I will contact patient by telephone after [facility] PT services have started, currently scheduled for Monday 02/15/16.  Marie Thomas denies further questions, concerns, issues, or problems today, and I confirmed that patient has my direct contact information, the main office number for [facility]  CM, and the [facility] 24-hour nurse advice line phone numbers.   Plan:  Patient will outreach to [facility] agencyregarding clarification of her current insurance provider.  Marie Thomas will continue to take her OTC medications as they are prescribed and attend all scheduled provider appointments.  Marie Thomas will contact her insurance company to obtain information on coverage of in-home medical alert system.  Marie Thomas will communicate/ work with [facility] PT/ OT services as ordered by Dr. Felipa Eth.  [name] will outreach to [place] to collaborate on options for patient to participate in a structured swimmingprogram through the [facility].  Marie Thomas will increase the number of times she applies her leg braces from 2 times per week to at least 3 times per week, and will record the days and lengths of times she wears her leg braces.  Marie Thomas will consider placement of grab bars by her toilet.    [name], RN, BSN, [employer] [facility] Care Management  [phone]

## 2016-02-05 NOTE — Patient Outreach (Signed)
[place] [facility]) Care Management [place] Telephone Outreach 02/05/2016  ARMANDA VIBERT 1945-09-29 [id number]  Successful incoming telephone outreach from [place] y.o.femalereferred to [place] by [facility] telephonic CM/ original PCP referral for in-home assessment of potential safety concerns and DME needs. 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 HIPAA verified with patient by phone today.   Call was placed today from [facility] regarding follow up plan to address DME needs.  Today, Jametta reported that she had heard back from [facility] PT "Tharon Aquas" stating that they had scheduled patient's first appointment on Monday 02/15/16.  Also reported that she contacted [place] regarding obtaining new DME for wheelchair and stated that she had not heard back from them yet.  Patient has numerous questions about process for obtaining new wheelchair, as she reports she has received conflicting information about the process; patient states that she is confused about the process to take.  Lyndsea also again confirmed that she had heard back from [facility] transportation services and that a SCAT assessment visit was scheduled at her home on Thursday 02/11/16.  States that she also heard back from "Fraser Din" who is a [employer] at [employer], stating that she needed to come prior to 02/11/16 assessment; Asal stated that she expects this visit to occur on "Monday or Tuesday" of next week.    Alaira denies further questions, concerns, issues, or problems today, and I confirmed that patient has my direct contact information, the main office number  for [facility] CM, and the [facility] 24-hour nurse advice line phone numbers.   Plan:  Will outreach to [facility] agency regarding process for obtaining new DME/ wheelchair, and will communicate follow up with patient as available.  Sritha will continue to take her OTC medications as they are prescribed and attend all scheduled provider appointments.  Angelicamaria will contact her insurance company to obtain information on coverage of in-home medical alert system.  Maritssa will communicate/ work with [facility] PT/ OT services as ordered by Dr. Felipa Eth.  [name] will outreach to [place] to collaborate on options for patient to participate in a structured swimmingprogram through the [facility].  Takoda will increase the number of times she applies her leg braces from 2 times per week to at least 3 times per week, and will record the days and lengths of times she wears her leg braces.  Tonisha will consider placement of grab bars by her toilet.  [name], RN, BSN, [employer] [facility] Care Management  [phone]

## 2016-02-05 NOTE — Patient Outreach (Signed)
[place] [facility]) Care Management [place] Coordination 02/05/2016  Marie Thomas 1945/12/02 [id number]   Successful outgoing telephone outreach for care coordination [facility] with Marie Thomas) [phone]: [name], 71 y.o.femalereferred to [place] by [facility] telephonic CM/ original PCP referral for in-home assessment of potential safety concerns and DME needs. 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   Call today was placed to obtain information on possible [place] [facility] assistance for DME for wheelchair around unique patient needs and home layout, and process patient is to take to obtain new wheelchair to meet her specific and unique needs.  During phone call this morning, I explained unique patient situation/ needs to [place], and she confirmed that order for wheelchair had been received from Dr. Carlyle Lipa office on 01-24-16, for 18 x 16 lightweight manual wheelchair.  [name] stated that the insurance they had listed for patient DME was Edward Thomas, which is not currently in network for [facility] DME.  [name] confirmed that patient was contacted by [facility] staff regarding this issue/ her out-of-pocket costs. [name] confirmed that the order was cancelled/ put on hold at that time, as patient's listed insurance for DME [facility]) is not in network.  [name] also clarified that [facility] DME staff do not visit patient homes for home assessments for manual wheelchairs, but do visit patient homes for assessments for motorized wheelchairs.  [name] recommended that patient contact the referrals department to clarify if  she has another insurance provider for DME, at [phone], ext [phone].  [name] provided the number for the [facility] DME store on [name] as [phone], ext. [phone].   Plan:  Will contact patient to provide information received today during care coordination telephone outreach.    [name], RN, BSN, [employer] [facility] Care Management  [phone]

## 2016-02-10 ENCOUNTER — Other Ambulatory Visit: Payer: Self-pay | Admitting: Licensed Clinical Social Worker

## 2016-02-10 NOTE — Patient Outreach (Addendum)
[place] [facility]) Care Management  02/10/2016  Marie Thomas Nov 12, 1945 [id number]   Assessment- CSW completed outreach to patient. Patient answered. Patient reports that she had to go to a funeral yesterday for a good friend that passed unexpectedly. CSW expressed condolences and provide emotional support. Patient reports that she has a manual wheelchair waiting for her to pick up at [facility]. She reports that she will make monthly payments and total cost is $253.40. She reports that she can arrange for someone to take her there to pick wheelchair up. Patient reports that there is some confusion with [facility] appointment. She reports that someone was suppose to come by last week to look at her home but that this appointment was canceled due to the snow. She reports that her new appointment is tomorrow but has no one has contacted her to provide her with any clarification. CSW will contact [facility] and follow up. CSW questioned if she has heard from [employer] and she declined. CSW will contact [employer] and follow up as someone should have contacted her by now. Patient reports that PT will start on 02/15/16.  CSW completed call to SCAT and spoke to [place]. Kennyth Lose confirmed that the SCAT bus will pick patient up tomorrow between 9:50-10:20. Patient misunderstood and thought that they would be completing assessment at her residence. CSW completed call to patient to provide updates. Patient did not answer. CSW left detailed voice message with updates. CSW encouraged Kennyth Lose with SCAT to contact patient and provide updated information.  CSW completed call to [employer]. CSW left a message with the [employer] program director Oren Bracket requesting return call.   [facility] CM Care Plan Problem One   Flowsheet Row Most Recent Value  Care Plan Problem One  Lack of community resouces and support  Role Documenting the Problem One  Clinical Social Worker  Care Plan for Problem One  Active  THN Long Term  Goal (31-90 days)  Within the next 90 days, patient will be able to verbalize [employer] to assist with transportation, mobility, meal assistance and personal care resources as evidenced by patient's self report to [place] Term Goal Start Date  01/14/16  Interventions for Problem One Long Term Goal  CSW will complete home visit and will provide education on needed resources. CSW will make referrals as needed. CSW will provide handouts for patient to keep. CSW will provide ongoing review as needed.  THN CM Short Term Goal #1 (0-30 days)  Within the next 30 days, patient will schedule [facility]AT assessment appointment as evidenced by self report to CSW  [facility] CM Short Term Goal #1 Start Date  01/19/16  Interventions for Short Term Goal #1  CSW completed SCAT application and provided education on their program. CSW will fax completed application to program and coordinate as needed.  THN CM Short Term Goal #2 (0-30 days)  Within the next 30 days, patient will schedule her in home assessment with [employer] as evidenced by self report to CSW  [facility] CM Short Term Goal #2 Start Date  01/19/16  Interventions for Short Term Goal #2  [employer] has still not contacted patient. CSW completed call to [employer] Director [place] and requested call back.      Plan-CSW will follow up within two-three weeks and continue to provide social work support and services.  [name], BSW, MSW, [email or web address] Phone: [phone] Fax: [phone]

## 2016-02-11 ENCOUNTER — Other Ambulatory Visit: Payer: Self-pay | Admitting: Licensed Clinical Social Worker

## 2016-02-11 NOTE — Patient Outreach (Signed)
[place] [facility]) Care Management  02/11/2016  Marie Thomas Feb 09, 1945 [id number]   Assessment- CSW received incoming call from [employer]. CSW was informed that they are not sure why patient has not been contacted yet. However, they have left a message with Betsy Pries, clinical social worker at [employer] and requested that she contact patient and schedule in home assessment in order for patient to get on [employer] wait list. The [employer] wait list have over 200 people at this time and they have not been able to take anyone off the wait list in over three months. CSW has already informed patient that there will be a long wait for this resource.   Plan-CSW will continue to coordinate care as needed and provide social work assistance. CSW will follow up with patient within two weeks.  [name], BSW, MSW, [email or web address] Phone: [phone] Fax: [phone]

## 2016-02-15 DIAGNOSIS — Z85528 Personal history of other malignant neoplasm of kidney: Secondary | ICD-10-CM | POA: Diagnosis not present

## 2016-02-15 DIAGNOSIS — R2689 Other abnormalities of gait and mobility: Secondary | ICD-10-CM | POA: Diagnosis not present

## 2016-02-15 DIAGNOSIS — Z993 Dependence on wheelchair: Secondary | ICD-10-CM | POA: Diagnosis not present

## 2016-02-15 DIAGNOSIS — E559 Vitamin D deficiency, unspecified: Secondary | ICD-10-CM | POA: Diagnosis not present

## 2016-02-15 DIAGNOSIS — E7800 Pure hypercholesterolemia, unspecified: Secondary | ICD-10-CM | POA: Diagnosis not present

## 2016-02-15 DIAGNOSIS — B91 Sequelae of poliomyelitis: Secondary | ICD-10-CM | POA: Diagnosis not present

## 2016-02-15 DIAGNOSIS — R202 Paresthesia of skin: Secondary | ICD-10-CM | POA: Diagnosis not present

## 2016-02-15 DIAGNOSIS — M1991 Primary osteoarthritis, unspecified site: Secondary | ICD-10-CM | POA: Diagnosis not present

## 2016-02-15 DIAGNOSIS — M62838 Other muscle spasm: Secondary | ICD-10-CM | POA: Diagnosis not present

## 2016-02-15 DIAGNOSIS — I10 Essential (primary) hypertension: Secondary | ICD-10-CM | POA: Diagnosis not present

## 2016-02-15 DIAGNOSIS — M6281 Muscle weakness (generalized): Secondary | ICD-10-CM | POA: Diagnosis not present

## 2016-02-16 ENCOUNTER — Other Ambulatory Visit: Payer: Self-pay | Admitting: *Deleted

## 2016-02-16 ENCOUNTER — Encounter: Payer: Self-pay | Admitting: *Deleted

## 2016-02-16 DIAGNOSIS — M62838 Other muscle spasm: Secondary | ICD-10-CM | POA: Diagnosis not present

## 2016-02-16 DIAGNOSIS — Z85528 Personal history of other malignant neoplasm of kidney: Secondary | ICD-10-CM | POA: Diagnosis not present

## 2016-02-16 DIAGNOSIS — M1991 Primary osteoarthritis, unspecified site: Secondary | ICD-10-CM | POA: Diagnosis not present

## 2016-02-16 DIAGNOSIS — E559 Vitamin D deficiency, unspecified: Secondary | ICD-10-CM | POA: Diagnosis not present

## 2016-02-16 DIAGNOSIS — R202 Paresthesia of skin: Secondary | ICD-10-CM | POA: Diagnosis not present

## 2016-02-16 DIAGNOSIS — R2689 Other abnormalities of gait and mobility: Secondary | ICD-10-CM | POA: Diagnosis not present

## 2016-02-16 DIAGNOSIS — M6281 Muscle weakness (generalized): Secondary | ICD-10-CM | POA: Diagnosis not present

## 2016-02-16 DIAGNOSIS — B91 Sequelae of poliomyelitis: Secondary | ICD-10-CM | POA: Diagnosis not present

## 2016-02-16 DIAGNOSIS — Z993 Dependence on wheelchair: Secondary | ICD-10-CM | POA: Diagnosis not present

## 2016-02-16 DIAGNOSIS — I10 Essential (primary) hypertension: Secondary | ICD-10-CM | POA: Diagnosis not present

## 2016-02-16 DIAGNOSIS — E7800 Pure hypercholesterolemia, unspecified: Secondary | ICD-10-CM | POA: Diagnosis not present

## 2016-02-16 NOTE — Patient Outreach (Signed)
[place] [facility]) Care Management [place] Telephone Outreach 02/16/2016  BRAELI PURVES [birth year] [id number]   Unsuccessful telephone outreach to [name] y.o.femalereferred to [place] by [facility] telephonic CM/ original PCP referral for in-home assessment of potential safety concerns and DME needs. 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  HIPAA compliant voice mail message left for patient, requesting return call back.  Plan:  Will re-attempt THN Community CM telephone outreach tomorrow if I do not hear back from patient first.  [name], RN, BSN, [place] Coordinator [facility] Care Management  [phone]

## 2016-02-17 ENCOUNTER — Encounter: Payer: Self-pay | Admitting: *Deleted

## 2016-02-17 ENCOUNTER — Other Ambulatory Visit: Payer: Self-pay | Admitting: *Deleted

## 2016-02-17 NOTE — Patient Outreach (Signed)
[name]) Care Management [place] Telephone Outreach 02/17/2016  Marie Thomas 1945-12-25 [id number]  Successful telephone outreach to Marie Thomas y.o.femalereferred to [place] by [facility] telephonic CM/ original PCP referral for in-home assessment of potential safety concerns and DME needs. 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  HIPAA/ identity verified with patient during phone call today.  Call today was to obtain update from patient on status of [place] services, wheelchair, community resources which Marie Thomas, [facility] CSW is assisting patient with.  Today, Marie Thomas reports that "everything is in place and going smoothly."  -- HH PT and OT involved in patient assessment/ evaluation of DME and functional needs; patient stated that she was able to connect with [place] to correct the issues she had regarding her insurance provider, and states that "it is all straightened out now."  -- Manual wheelchair waiting for patient pick up from [facility] DME; patient reports that [facility] PT, who also works with [facility], may be bringing wheelchair to patient at time of next home visit.  -- Patient reports that she has successfully increased the number of times she is wearing her leg braces, and states this has been going well.  -- [employer] assessment completed; patient stated that she has been approved for [employer], but acknowledges that there is a waiting list.  -- Waiting to hear from [place]; acknowledges that Marie Thomas is working with [employer], and that evaluation is pending.  Marie Thomas denies further questions, concerns,  issues, or problems today, and I confirmed that patient has my direct contact information, the main office number for [facility] CM, and the [facility] 24-hour nurse advice line phone numbers. We discussed today that patient may be ready for discharge form [place] RN CM program, as thus far, she has almost met all of her previously established [facility] RN CM goals.  We scheduled [facility] RN CM discharge visit for next month; patient will contact me directly should she need assistance or have additional needs/ concerns prior to next scheduled visit.  Plan:  Marie Thomas will continue to take her OTC medications as they are prescribed and attend all scheduled provider appointments.  Marie Thomas will contact her insurance company to obtain information on coverage of in-home medical alert system.  Marie Thomas will communicate/ work with [facility] PT/ OT services as ordered by Dr. Felipa Eth.  [name] will outreach to [place] to collaborate on options for patient to participate in a structured swimmingprogram through the [facility].  Marie Thomas will continue to increase the number of times she applies her leg braces from 2 times per week to at least 3 times per week, and will record the days and lengths of times she wears her leg braces.  Marie Thomas will consider placement of grab bars by her toilet.  [facility] RN CM discharge home visit scheduled today for next month.   Marie Rack, RN, BSN, [employer] [facility] Care Management  [phone]

## 2016-02-22 ENCOUNTER — Other Ambulatory Visit: Payer: Self-pay | Admitting: Licensed Clinical Social Worker

## 2016-02-22 NOTE — Patient Outreach (Signed)
[facility]) Care Management  02/22/2016  Marie Thomas 07-07-1945 [id number]  Assessment- CSW completed outreach to patient on 02/22/16 and patient answered. Patient reports that she is doing very well. She reports that [employer] contacted her and they completed a home assessment and approved her for their program. She is now on the wait list for [employer] and understands that the wait list has over 200 patients at this time and could be up to a year wait until she is off the wait list. She states that she someone from [place] visited her residence and looked at the driveway beside her home to determine if their Lucianne Lei could pick her up in the back of her home where she has a ramp. She states that she has not heard back. She reports that she did not go to the SCAT office and complete an evaluation. CSW contacted Floydene Flock with SCAT services and left a voice message requesting a return call back in regards to patient's application status. CSW will await to hear back from [employer]AT. CSW questioned if patient was able to obtain her manual wheelchair. She reports that either her physical therapist or occupational therapist will be picking up her wheelchair for her within the next few weeks. Patient was questioned about considering a ramp to be built at the front of her residence. CSW educated her on both [employer] and [employer] and [employer]. Patient would not qualify for the [employer] because she does not own her home. CSW educated her on the [employer]ampin' Up program with [employer] and [employer]. Patient has her reservations in regards to having a ramp built but states that she would like for this information to be mailed to her. CSW questioned if she could complete home visit this week to follow up on each resource but patient stated that she did not need a home visit at this time. CSW will complete next home visit in two weeks to follow up on  resources.  Plan-CSW will await to hear back from SCAT. CSW will mail out information in regards to [place], [place], MSW, [email or web address] Phone: [phone] Fax: [phone]

## 2016-02-23 ENCOUNTER — Other Ambulatory Visit: Payer: Self-pay | Admitting: Licensed Clinical Social Worker

## 2016-02-23 NOTE — Patient Outreach (Signed)
[facility] [facility]) Care Management  02/23/2016  Marie Thomas Jun 03, 1945 [id number]   Assessment- CSW received return call from [place]. CSW was informed that they did do a home evaluation and that it was determined that the [place] will not be able to travel behind her home on the side entrance in order to get to the ramp at the back of her residence. They stated that patient is welcome to schedule appointment in order to become eligible for services but she would have to be picked up at the front of the house at the curb without any given assistance. CSW informed them that this will not benefit patient.  CSW completed call to patient but was unable to reach her. CSW left a HIPPA compliant voice message with the updates listed above. CSW insisted that she strongly consider contacting [facility] and [employer] for a ramp to be built at the front of her home. Ramp resources will be sent out today.  Plan-CSW will follow up within two weeks.  [name], BSW, MSW, [email or web address] Phone: [phone] Fax: [phone]

## 2016-02-26 DIAGNOSIS — R269 Unspecified abnormalities of gait and mobility: Secondary | ICD-10-CM | POA: Diagnosis not present

## 2016-02-26 DIAGNOSIS — M1991 Primary osteoarthritis, unspecified site: Secondary | ICD-10-CM | POA: Diagnosis not present

## 2016-02-26 DIAGNOSIS — A809 Acute poliomyelitis, unspecified: Secondary | ICD-10-CM | POA: Diagnosis not present

## 2016-02-26 DIAGNOSIS — R202 Paresthesia of skin: Secondary | ICD-10-CM | POA: Diagnosis not present

## 2016-02-26 DIAGNOSIS — M6281 Muscle weakness (generalized): Secondary | ICD-10-CM | POA: Diagnosis not present

## 2016-02-26 DIAGNOSIS — E7800 Pure hypercholesterolemia, unspecified: Secondary | ICD-10-CM | POA: Diagnosis not present

## 2016-02-26 DIAGNOSIS — M62838 Other muscle spasm: Secondary | ICD-10-CM | POA: Diagnosis not present

## 2016-02-26 DIAGNOSIS — B91 Sequelae of poliomyelitis: Secondary | ICD-10-CM | POA: Diagnosis not present

## 2016-02-26 DIAGNOSIS — Z993 Dependence on wheelchair: Secondary | ICD-10-CM | POA: Diagnosis not present

## 2016-02-26 DIAGNOSIS — E559 Vitamin D deficiency, unspecified: Secondary | ICD-10-CM | POA: Diagnosis not present

## 2016-02-26 DIAGNOSIS — M810 Age-related osteoporosis without current pathological fracture: Secondary | ICD-10-CM | POA: Diagnosis not present

## 2016-02-26 DIAGNOSIS — I10 Essential (primary) hypertension: Secondary | ICD-10-CM | POA: Diagnosis not present

## 2016-02-26 DIAGNOSIS — Z85528 Personal history of other malignant neoplasm of kidney: Secondary | ICD-10-CM | POA: Diagnosis not present

## 2016-02-26 DIAGNOSIS — R2689 Other abnormalities of gait and mobility: Secondary | ICD-10-CM | POA: Diagnosis not present

## 2016-02-26 NOTE — Patient Outreach (Signed)
[facility]) Care Management  02/26/2016  Marie Thomas 01/29/1945 [id number]   Request received from [name], LCSW to mail patient resources for ramps in the home.   [name]  [facility] Care Management Assistant

## 2016-03-03 ENCOUNTER — Other Ambulatory Visit: Payer: Self-pay | Admitting: Licensed Clinical Social Worker

## 2016-03-03 NOTE — Patient Outreach (Signed)
[facility]) Care Management  03/03/2016  MAEGHAN GRAPE May 01, 1945 [id number]   Assessment-CSW completed outreach attempt today. CSW unable to reach patient successfully. CSW left a HIPPA compliant voice message encouraging patient to return call once available.  Plan-CSW will await return call or complete an additional outreach if needed within one week.  [name], BSW, MSW, [email or web address] Phone: [phone] Fax: [phone]

## 2016-03-07 ENCOUNTER — Other Ambulatory Visit: Payer: Self-pay | Admitting: Licensed Clinical Social Worker

## 2016-03-07 NOTE — Patient Outreach (Signed)
[facility]) Care Management  03/07/2016  Marie Thomas 1945/02/21 [id number]   Assessment- CSW completed outreach to patient and she answered. She confirms that she received the information on ramps in the mail that [place] sent her. Patient is agreeable to home visit tomorrow to review all resources. She is hesitant at first to allow CSW to complete home visit but then states that it would be helpful for her to do so.  Plan-CSW will complete home visit tomorrow.  [name], BSW, MSW, [email or web address] Phone: [phone] Fax: [phone]

## 2016-03-08 ENCOUNTER — Other Ambulatory Visit: Payer: Self-pay | Admitting: Licensed Clinical Social Worker

## 2016-03-08 ENCOUNTER — Other Ambulatory Visit: Payer: Self-pay | Admitting: *Deleted

## 2016-03-08 ENCOUNTER — Encounter: Payer: Self-pay | Admitting: *Deleted

## 2016-03-08 NOTE — Patient Outreach (Signed)
[facility]) Care Management  [facility] Social Work  03/08/2016  Marie Thomas 01-Feb-1945 [id number]    Encounter Medications:  Outpatient Encounter Prescriptions as of 03/08/2016  Medication Sig Note  . acetaminophen (TYLENOL) 500 MG tablet Take 500 mg by mouth as needed for mild pain.    . Cholecalciferol (VITAMIN D) 2000 UNITS tablet Take 2,000 Units by mouth daily.   Marland Kitchen docusate sodium (COLACE) 100 MG capsule Take 1 capsule (100 mg total) by mouth every 12 (twelve) hours. (Patient taking differently: Take 100 mg by mouth 2 (two) times daily as needed for moderate constipation. ) 01/13/2016: Taking as needed  . feeding supplement, ENSURE ENLIVE, (ENSURE ENLIVE) LIQD Take 237 mLs by mouth 2 (two) times daily between meals. 03/08/2016: Occasionally uses   . fluconazole (DIFLUCAN) 150 MG tablet Take 1 tablet (150 mg total) by mouth once. (Patient not taking: Reported on 01/13/2016) 01/13/2016: Completed this medication in 2016  . HYDROcodone-acetaminophen (NORCO) 5-325 MG per tablet Take 1-2 tablets by mouth every 4 (four) hours as needed for moderate pain. (Patient not taking: Reported on 01/13/2016) 01/13/2016: Patient completed this medication in 2016  . HYDROcodone-acetaminophen (NORCO/VICODIN) 5-325 MG per tablet Take 1-2 tablets by mouth every 6 (six) hours as needed for moderate pain or severe pain. (Patient not taking: Reported on 01/13/2016) 01/13/2016: Patient completed this medication in 2016   Facility-Administered Encounter Medications as of 03/08/2016  Medication  . gentamicin (GARAMYCIN) 260 mg in dextrose 5 % 100 mL IVPB    Functional Status:  In your present state of health, do you have any difficulty performing the following activities: 01/01/2016  Hearing? N  Vision? N  Difficulty concentrating or making decisions? N  Walking or climbing stairs? Y  Dressing or bathing? N  Doing errands, shopping? N  Preparing Food and eating ? N  Using the Toilet? N   In the past six months, have you accidently leaked urine? N  Do you have problems with loss of bowel control? N  Managing your Medications? N  Managing your Finances? N  Housekeeping or managing your Housekeeping? Y  Some recent data might be hidden    Fall/Depression Screening:  PHQ 2/9 Scores 03/08/2016 02/17/2016 01/19/2016 12/30/2015  PHQ - 2 Score 0 0 0 0    Assessment: CSW completed joint home visit with [facility] RNCM on 03/08/16. Patient is doing very well. She is participating in PT and OT. She reports that her Home Health Occupational therapist went and picked up her manual wheelchair for her so that she did not have to pay a $75 fee to get it delivered. She states that she is going to start doing daily exercises on her own that her PT encouraged her to do. She has made a lot of progress with putting on leg braces.   Patient reports that she received a call from [facility] yesterday and they wanted to ensure that she still wanted to be on the wait list and she confirmed that she did. Patient expresses some confusion in regards to the different wait list she is on for various programs. CSW educated patient on In Home [employer] (2 year wait but free service, [facility] (1 year wait and sliding scale fee) and [employer] (1 year wait but free service.) Patient still had handouts on all information that CSW previously provided so that she can look back on it to review if ever needed. Patient was elgible for SCAT but cannot use service which  is why CSW strongly encouraged patient to consider gaining a ramp at the front of her residence (she has one in the back) with the [place] and [employer]. However, patient is unsure if she wishes to gain a ramp at this time but was educated on their process if she were to change her mind in the future. Patient continues to drive herself to appointments but would benefit with having a ramp at the front of her home in order to have SCAT  services.  Patient is interested in socialization opportunities. CSW completed review of senior centers in the area. She is interested in aquatics but would need a lift in the pool to assist her with getting in and out. CSW contacted the [facility] and encouraged them to return call to patient. She contacted CSW back later in the day and stated that they do have a lift in the pool but are not able to assist her with getting wheelchair out of her trunk when she arrives. Patient would need to have an aide to assist with this or see if a volunteer could help. [facility] RNCM is working on seeing if patient can attend the [facility] and have her needs be met appropriately.   Plan: CSW will follow up within two-three weeks to evaluate if there are any further social work needs.  [facility] CM Care Plan Problem One   Flowsheet Row Most Recent Value  Care Plan Problem One  Lack of community resouces and support  Role Documenting the Problem One  Clinical Social Worker  Care Plan for Problem One  Active  THN Long Term Goal (31-90 days)  Within the next 90 days, patient will be able to verbalize [employer] to assist with transportation, mobility, meal assistance and personal care resources as evidenced by patient's self report to [place] Term Goal Start Date  01/14/16  Interventions for Problem One Long Term Goal  CSW will complete home visit and will provide education on needed resources. CSW will make referrals as needed. CSW will provide handouts for patient to keep. CSW will provide ongoing review as needed.  THN CM Short Term Goal #1 (0-30 days)  Within the next 30 days, patient will schedule SCAT assessment appointment as evidenced by self report to CSW  [facility] CM Short Term Goal #1 Start Date  01/19/16  [facility] CM Short Term Goal #1 Met Date  03/08/16  Interventions for Short Term Goal #1  Goal met, However, patient is not able to use services.  [facility] CM Short Term Goal #2 (0-30  days)  Within the next 30 days, patient will schedule her in home assessment with [employer] as evidenced by self report to CSW  [facility] CM Short Term Goal #2 Start Date  01/19/16  [facility] CM Short Term Goal #2 Met Date  03/08/16  Interventions for Short Term Goal #2  Goal met. She is now on the wait list for program.      [name], BSW, MSW, [email or web address] Phone: [phone] Fax: [phone]

## 2016-03-08 NOTE — Patient Outreach (Signed)
[place] [facility]) Care Management  [place], Routine Home Visit  03/08/2016  Marie Thomas 1945/11/29 [id number]  Marie Thomas is an 71 y.o. female referred to [place] by [facility] telephonic CM/ original PCP referral for in-home assessment of potential safety concerns and DME needs. 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 Patient is essentially wheelchair bound due to bilateral LE paralysis, and has specific and unique needs around home DME needs and community resource needs.  HIPAA/ identity verified with patient in person during home visit today.  Today's visit was a joint visit with [facility] CSW [name], who is also active in patient's care.  Today, Marie Thomas reports that she is "doing great."  Marie Thomas has her new wheelchair present in her home, and states that she "is getting used to it."  -- HH PT and OT previously involved in patient assessment/ evaluation of DME and functional needs is now completed; patient stated that she found [place] assistance helpful in learning strengthening exercises and navigating use of her new wheelchair.  States that she is still interested in options available to her around swimming; call was placed to [place] to inquire about options; Marie Thomas contacted [facility]; voice mail messages were left with both, requesting call back.    -- Reports that she has continued successfully increasing the number of times she is wearing her leg braces, and states this has been going well; verbalizes plans to continue wearing leg braces more often.  -- Mobile Meals assessment completed; patient stated that she has been approved for [employer], but acknowledges that there is a waiting list.  -- Discussed transportation options with [facility] CSW  Marie Thomas.  -- Medications: Marie Thomas reports no changes in her regular OTC medications, and states that she is taking all as prescribed.   Marie Thomas denies further questions, concerns, issues, or problems today, and I confirmed that patient has my direct contact information, the main office number for [facility] CM, and the [facility] 24-hour nurse advice line phone numbers. We again discussed today that patient may soon be ready for discharge from [place] [employer] program, as thus far, she has almost met all of her previously established [facility] RN CM goals, and patient agrees that she is almost ready for [facility] discharge.  We agreed to contact patient again after information about possible resources for swimming were obtained.  Subjective:  "I've made a lot of progress, and have my new wheelchair.  I hope there are options available for me to start swimming again."  Objective:    BP (!) 158/76   Pulse 92   Resp 18   SpO2 94%    Review of Systems  Constitutional: Negative.   Respiratory: Negative.  Negative for sputum production, shortness of breath and wheezing.   Cardiovascular: Negative.   Gastrointestinal: Negative for abdominal pain and nausea.  Genitourinary: Negative for dysuria and urgency.  Musculoskeletal: Negative for falls and joint pain.  Neurological: Negative.  Negative for weakness.  Psychiatric/Behavioral: Negative for depression. The patient is not nervous/anxious.     Physical Exam  Constitutional: She is oriented to person, place, and time. She appears well-developed and well-nourished.  Cardiovascular: Normal rate, regular rhythm, normal heart sounds and intact distal pulses.   Respiratory: Effort normal and breath sounds normal. No respiratory distress. She has no wheezes. She  has no rales.  GI: Soft. Bowel sounds are normal.  Musculoskeletal: She exhibits no edema.  Neurological: She is alert and oriented to person, place, and time.  Skin: Skin is warm and dry.  Psychiatric: She  has a normal mood and affect. Her behavior is normal. Judgment and thought content normal.   Assessment:  Marie Thomas has a unique living situation/ needs around her challenges in navigating her home/ overall life, which requires significant personal effort.  Alayja has taken steps to increase her strength and become more active in an effort to prevent falls and remain independent. Marie Thomas would like to have a place to swim, as she has previously enjoyed swimming and found it helpful in increasing her strength and overall state of health. Marie Thomas has actively participated in [facility] CSW outreach for resources with housekeeping/ laundry/ meals/ transportation.  Marie Thomas has actively participated with home health physical/ occupational therapy to increase her strength and endurance, and has obtained a new wheelchair.  Marie Thomas is committed to maintaining as much independence as possible around her stated challenges.  Plan:   Marie Thomas will continue to take her OTC medications as they are prescribed and attend all scheduled provider appointments.  [name] will collaborate with [facility] RN on options available for patient to participate in a structured swimming program through the [facility], and will update patient accordingly.  Marie Thomas will continue increasing the number of times she applies her leg braces from 2 times per week to at least 3 times per week.  [place] involvement in patient's care will continue with telephone outreach after [place] collaboration is completed.   THN CM Care Plan Problem One   Flowsheet Row Most Recent Value  Care Plan Problem One  Safety concerns related to current housing layout and being wheelchair bound  Role Documenting the Problem One  Care Management [place] for Problem One  Active  THN Long Term Goal (31-90 days)  Within the next 60 days, patient will be able to verbalize options for assistance with maintaining her independence, as evidenced  by patient reporting during [place] outreach  [place] Term Goal Start Date  01/13/16  Interventions for Problem One Long Term Goal  Utilizing teachback method, provided patient with positive reinforcement for actively participating in [facility] services for PT/ OT, and THN CSW and RN CM,  placed call to [place] regarding options for swimming that might be available to patient  THN CM Short Term Goal #1 (0-30 days)  Within the next 14 days, patient will meet with [place] RN CM for initial in-home visit for safety and DME needs evaluation, as evidenced by successful completion of THN CM in-home visit  THN CM Short Term Goal #1 Start Date  01/01/16  [facility] CM Short Term Goal #1 Met Date  01/13/16  Interventions for Short Term Goal #1  Scheduled initial THN Community CM in-home visit with patient today and explained THN CM program to patient  THN CM Short Term Goal #2 (0-30 days)  Within the next 30 days, patient will discuss her needs for assistance with [facility] CSW, as evidenced by patient reporting during [facility] RN CM outreach and collaboration with [facility] CSW  [facility] CM Short Term Goal #2 Start Date  01/13/16  [facility] CM Short Term Goal #2 Met Date  01/21/16  Interventions for Short Term Goal #2  Utilizing teachback method, discussed role and expertise of [facility] CSW in connecting patient to various  options for in-home assistance with care needs   THN CM Short Term Goal #3 (0-30 days)  Over the next 30 days, patient will consider options for additional DME needs/ grab bar in bathroom area, as evidenced by patient reporting during [facility] RN CM outreach  [facility] CM Short Term Goal #3 Start Date  02/17/16  [facility] CM Short Term Goal #3 Met Date  03/08/16  Interventions for Short [place] Goal #3  Utilizing teachback method, reiterated DME options available to patient to promote patient safety,  provided positive reinforcement for working with [facility] OT and for obtaining DME/ new wheelchair  THN CM Short Term Goal #4 (0-30 days)  Over  the next 14 days, patient will contact her insurance company for information about coverage of in-home medical alert system, as evidenced by patient reporting during [place] outreach  [facility] CM Short Term Goal #4 Start Date  02/17/16  [facility] CM Short Term Goal #4 Met Date  03/08/16  Interventions for Short Term Goal #4  Utilizing teachback method, again discussed with/ encouraged patient to contact insurance company to determine specific information about possible coverage of in-home medical alert system    [facility] CM Care Plan Problem Two   Flowsheet Row Most Recent Value  Care Plan Problem Two  Potential for deconditioning related to patient's paraplegia and decreased mobility  Role Documenting the Problem Two  Care Management Coordinator  Care Plan for Problem Two  Not Active  Interventions for Problem Two Long Term Goal   Utilizing teachback method, reiterated options for patient to get more structured exercise and build strength and endurance  THN Long Term Goal (31-90) days  Over the next 31 days, patient will be able to verbalize options to get more structured exercise on a regular basis, as evidenced by patient reporting during [facility] RN CM outreach  [facility] Long Term Goal Start Date  01/13/16  THN Long Term Goal Met Date  02/17/16  THN CM Short Term Goal #1 (0-30 days)  Over the next 2 weeks, patient will apply her leg braces at least 3 times per week, and will keep a record of the days she applies the braces and the length of time she wears the braces, as evidenced by review of patient records/ reporting during [place] CM outreach  [facility] CM Short Term Goal #1 Start Date  01/25/16  [facility] CM Short Term Goal #1 Met Date   02/17/16  Interventions for Short Term Goal #2   Utilizing teachback method, again discussed value of gradually increasing the number of days she applies her leg braces to incorporate into her personal routine,  provided positive reinforcement for progress patient has made  thus far     [name], RN, BSN, [employer] Coordinator [facility] Care Management  [phone]

## 2016-03-09 ENCOUNTER — Other Ambulatory Visit: Payer: Self-pay | Admitting: *Deleted

## 2016-03-09 ENCOUNTER — Other Ambulatory Visit: Payer: Self-pay | Admitting: Licensed Clinical Social Worker

## 2016-03-09 ENCOUNTER — Encounter: Payer: Self-pay | Admitting: *Deleted

## 2016-03-09 NOTE — Patient Outreach (Signed)
[place] [facility]) Care Management [place] Telephone Outreach/ Case closure  03/09/2016  JAKHIA BUXTON 30-May-1945 [id number]  Successful telephone outreach to [name] y.o.femalereferred to [place] by [facility] telephonic CM/ original PCP referral for in-home assessment of potential safety concerns and DME needs. 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 Patient is essentially wheelchair bound due to bilateral LE paralysis, and has specific and unique needs around home DME needs and community resource needs. HIPAA/ identity verified with patient during phone call today.  Call was placed to today to update patient that I had successfully connected with Vanita Ingles, [facility] RN regarding patient's request to explore resource options available to her around swimming/ exercise, which patient has previously voiced interest in.  I let patient know that Jackelyn Poling would be contacting her soon, and provided Debbie's phone number to patient.  Jahniya denies further questions, concerns, issues, or problems today, and we both agreed that patient is now ready for discharge from [place], as she has successfully met her previously established goals.  I confirmed that patient has my direct contact information, the main office number for [facility] CM, and the [facility] 24-hour nurse advice line phone numbers should needs arise in the future.   Plan:  I will close patient's [facility] RN CM case and notify [facility] CSW and patient's PCP of same.   [name], RN, BSN, [employer] [facility] Care Management  [phone]

## 2016-03-09 NOTE — Patient Outreach (Signed)
[facility]) Care Management [place] Telephone Outreach, Care Coordination  03/09/2016  Marie Thomas 09-23-1945 [id number]  Successful telephone outreach to Marie Thomas, [facility] RN re: Marie Thomas, 71 y.o. female referred to [place] by [facility] telephonic CM/ original PCP referral for in-home assessment of potential safety concerns and DME needs. 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 Patient is essentially wheelchair bound due to bilateral LE paralysis, and has specific and unique needs around home DME needs and community resource needs.   Call was placed to today to explore resource options available to patient around swimming/ exercise, which patient has voiced interest in.  Today, Marie Thomas confirms that patient is a good candidate for provider referral exercise program which is 12-weeks long at the [facility]; we discussed patient's unique needs, and Marie Thomas agreed to contact patient to provide her with information about program.  Plan:  I will contact patient and will provide information from [place] to her.  Marie Rack, RN, BSN, [employer] [facility] Care Management  [phone]

## 2016-03-09 NOTE — Patient Outreach (Signed)
  [facility]) Care Management  03/09/2016  Marie Thomas [birth year] [id number]  Assessment- CSW received notification from [place]. She will be closing patient's case. CSW informed THN RNCM that [facility] will not be able to assist patient getting in and out of car but that they do have a lift in the pool. However, patient would have to be the one to navigate pool lift herself. She contacted [place] that works with [employer]. Jackelyn Poling is very positive that she will be able to meet patient's needs with the [place] swimming/exercise program.  Plan-CSW will follow up within 2 weeks and then evaluate if there are any further social work needs at that time.  [name], BSW, MSW, [email or web address] Phone: [phone] Fax: [phone]

## 2016-03-11 ENCOUNTER — Other Ambulatory Visit: Payer: Self-pay | Admitting: Licensed Clinical Social Worker

## 2016-03-11 ENCOUNTER — Encounter: Payer: Self-pay | Admitting: Licensed Clinical Social Worker

## 2016-03-11 NOTE — Patient Outreach (Signed)
[place] [facility]) Care Management  03/11/2016  Marie Thomas Jan 02, 1946 [id number]   Assessment- CSW completed outreach call to patient and she answered. She is very excited about getting involved with the [employer] program. [facility] RNCM and CSW have been in contact with [facility] RN Vanita Ingles. CSW reviewed all community resources again (including those that she is on the wait list for.) CSW provided contact number for [facility] RN Vanita Ingles. CSW also provided contact information to [place] and she has agreed to follow up on [facility] program. Patient has no further social work needs at this time but was strongly encouraged to consider gaining a ramp in the front of her residence in the future. Patient will keep handout on program and will use it if ever needed.  Plan-CSW will complete case closure at this time. CSW will notify [place] Management Assistant and PCP.  [facility] CM Care Plan Problem One   Flowsheet Row Most Recent Value  Care Plan Problem One  Lack of community resouces and support  Role Documenting the Problem One  Clinical Social Worker  Care Plan for Problem One  Active  THN Long Term Goal (31-90 days)  Within the next 90 days, patient will be able to verbalize [employer] to assist with transportation, mobility, meal assistance and personal care resources as evidenced by patient's self report to [place] Term Goal Start Date  01/14/16  [facility] Long Term Goal Met Date  03/11/16  Interventions for Problem One Long Term Goal  Goal met and patient is now on many wait list for resources.  THN CM Short Term Goal #1 (0-30 days)  Within the next 30 days, patient will schedule SCAT assessment appointment as evidenced by self report to CSW  [facility] CM Short Term Goal #1 Start Date  01/19/16  [facility] CM Short Term Goal #1 Met Date  03/08/16  Interventions for Short Term Goal #1  Goal met, However, patient is not able to use services.  THN CM Short Term  Goal #2 (0-30 days)  Within the next 30 days, patient will schedule her in home assessment with [employer] as evidenced by self report to CSW  [facility] CM Short Term Goal #2 Start Date  01/19/16  [facility] CM Short Term Goal #2 Met Date  03/08/16  Interventions for Short Term Goal #2  Goal met. She is now on the wait list for program.      [name], BSW, MSW, [email or web address] Phone: [phone] Fax: [phone]

## 2016-03-25 DIAGNOSIS — R269 Unspecified abnormalities of gait and mobility: Secondary | ICD-10-CM | POA: Diagnosis not present

## 2016-03-25 DIAGNOSIS — M810 Age-related osteoporosis without current pathological fracture: Secondary | ICD-10-CM | POA: Diagnosis not present

## 2016-03-25 DIAGNOSIS — A809 Acute poliomyelitis, unspecified: Secondary | ICD-10-CM | POA: Diagnosis not present

## 2016-04-25 DIAGNOSIS — A809 Acute poliomyelitis, unspecified: Secondary | ICD-10-CM | POA: Diagnosis not present

## 2016-04-25 DIAGNOSIS — M810 Age-related osteoporosis without current pathological fracture: Secondary | ICD-10-CM | POA: Diagnosis not present

## 2016-04-25 DIAGNOSIS — R269 Unspecified abnormalities of gait and mobility: Secondary | ICD-10-CM | POA: Diagnosis not present

## 2016-05-05 ENCOUNTER — Other Ambulatory Visit: Payer: Self-pay | Admitting: Geriatric Medicine

## 2016-05-05 DIAGNOSIS — H25813 Combined forms of age-related cataract, bilateral: Secondary | ICD-10-CM | POA: Diagnosis not present

## 2016-05-05 DIAGNOSIS — H524 Presbyopia: Secondary | ICD-10-CM | POA: Diagnosis not present

## 2016-05-05 DIAGNOSIS — Z1231 Encounter for screening mammogram for malignant neoplasm of breast: Secondary | ICD-10-CM

## 2016-05-25 DIAGNOSIS — M810 Age-related osteoporosis without current pathological fracture: Secondary | ICD-10-CM | POA: Diagnosis not present

## 2016-05-25 DIAGNOSIS — R269 Unspecified abnormalities of gait and mobility: Secondary | ICD-10-CM | POA: Diagnosis not present

## 2016-05-25 DIAGNOSIS — A809 Acute poliomyelitis, unspecified: Secondary | ICD-10-CM | POA: Diagnosis not present

## 2016-06-06 DIAGNOSIS — H2511 Age-related nuclear cataract, right eye: Secondary | ICD-10-CM | POA: Diagnosis not present

## 2016-06-06 DIAGNOSIS — H2512 Age-related nuclear cataract, left eye: Secondary | ICD-10-CM | POA: Diagnosis not present

## 2016-06-17 ENCOUNTER — Ambulatory Visit
Admission: RE | Admit: 2016-06-17 | Discharge: 2016-06-17 | Disposition: A | Discharge status: Home / Self Care | Payer: PPO | Source: Ambulatory Visit | Attending: Geriatric Medicine | Admitting: Geriatric Medicine

## 2016-06-17 DIAGNOSIS — Z1231 Encounter for screening mammogram for malignant neoplasm of breast: Secondary | ICD-10-CM | POA: Diagnosis not present

## 2016-06-25 DIAGNOSIS — M810 Age-related osteoporosis without current pathological fracture: Secondary | ICD-10-CM | POA: Diagnosis not present

## 2016-06-25 DIAGNOSIS — A809 Acute poliomyelitis, unspecified: Secondary | ICD-10-CM | POA: Diagnosis not present

## 2016-06-25 DIAGNOSIS — R269 Unspecified abnormalities of gait and mobility: Secondary | ICD-10-CM | POA: Diagnosis not present

## 2016-07-18 DIAGNOSIS — H2511 Age-related nuclear cataract, right eye: Secondary | ICD-10-CM | POA: Diagnosis not present

## 2016-07-18 DIAGNOSIS — H25811 Combined forms of age-related cataract, right eye: Secondary | ICD-10-CM | POA: Diagnosis not present

## 2016-07-25 DIAGNOSIS — R269 Unspecified abnormalities of gait and mobility: Secondary | ICD-10-CM | POA: Diagnosis not present

## 2016-07-25 DIAGNOSIS — A809 Acute poliomyelitis, unspecified: Secondary | ICD-10-CM | POA: Diagnosis not present

## 2016-07-25 DIAGNOSIS — M810 Age-related osteoporosis without current pathological fracture: Secondary | ICD-10-CM | POA: Diagnosis not present

## 2016-08-15 DIAGNOSIS — H2512 Age-related nuclear cataract, left eye: Secondary | ICD-10-CM | POA: Diagnosis not present

## 2016-08-15 DIAGNOSIS — H25812 Combined forms of age-related cataract, left eye: Secondary | ICD-10-CM | POA: Diagnosis not present

## 2016-08-25 DIAGNOSIS — R269 Unspecified abnormalities of gait and mobility: Secondary | ICD-10-CM | POA: Diagnosis not present

## 2016-08-25 DIAGNOSIS — A809 Acute poliomyelitis, unspecified: Secondary | ICD-10-CM | POA: Diagnosis not present

## 2016-08-25 DIAGNOSIS — M810 Age-related osteoporosis without current pathological fracture: Secondary | ICD-10-CM | POA: Diagnosis not present

## 2016-09-25 DIAGNOSIS — M810 Age-related osteoporosis without current pathological fracture: Secondary | ICD-10-CM | POA: Diagnosis not present

## 2016-09-25 DIAGNOSIS — R269 Unspecified abnormalities of gait and mobility: Secondary | ICD-10-CM | POA: Diagnosis not present

## 2016-09-25 DIAGNOSIS — A809 Acute poliomyelitis, unspecified: Secondary | ICD-10-CM | POA: Diagnosis not present

## 2016-10-25 DIAGNOSIS — M810 Age-related osteoporosis without current pathological fracture: Secondary | ICD-10-CM | POA: Diagnosis not present

## 2016-10-25 DIAGNOSIS — R269 Unspecified abnormalities of gait and mobility: Secondary | ICD-10-CM | POA: Diagnosis not present

## 2016-10-25 DIAGNOSIS — A809 Acute poliomyelitis, unspecified: Secondary | ICD-10-CM | POA: Diagnosis not present

## 2016-11-25 DIAGNOSIS — R269 Unspecified abnormalities of gait and mobility: Secondary | ICD-10-CM | POA: Diagnosis not present

## 2016-11-25 DIAGNOSIS — M810 Age-related osteoporosis without current pathological fracture: Secondary | ICD-10-CM | POA: Diagnosis not present

## 2016-11-25 DIAGNOSIS — A809 Acute poliomyelitis, unspecified: Secondary | ICD-10-CM | POA: Diagnosis not present

## 2016-12-25 DIAGNOSIS — R269 Unspecified abnormalities of gait and mobility: Secondary | ICD-10-CM | POA: Diagnosis not present

## 2016-12-25 DIAGNOSIS — A809 Acute poliomyelitis, unspecified: Secondary | ICD-10-CM | POA: Diagnosis not present

## 2016-12-25 DIAGNOSIS — M810 Age-related osteoporosis without current pathological fracture: Secondary | ICD-10-CM | POA: Diagnosis not present

## 2017-01-23 ENCOUNTER — Other Ambulatory Visit: Payer: Self-pay | Admitting: Geriatric Medicine

## 2017-01-23 DIAGNOSIS — Z1231 Encounter for screening mammogram for malignant neoplasm of breast: Secondary | ICD-10-CM

## 2017-01-25 DIAGNOSIS — M810 Age-related osteoporosis without current pathological fracture: Secondary | ICD-10-CM | POA: Diagnosis not present

## 2017-01-25 DIAGNOSIS — R269 Unspecified abnormalities of gait and mobility: Secondary | ICD-10-CM | POA: Diagnosis not present

## 2017-01-25 DIAGNOSIS — A809 Acute poliomyelitis, unspecified: Secondary | ICD-10-CM | POA: Diagnosis not present

## 2017-02-01 ENCOUNTER — Other Ambulatory Visit: Payer: Self-pay | Admitting: *Deleted

## 2017-02-01 NOTE — Patient Outreach (Signed)
[place] [facility]) Care Management [place] Telephone Outreach Care Coordination  02/01/2017  RONIKA KELSON April 29, 1945 [id number]  Successful telephone outreach to "Tiffany" RN with [place] [phone]) re: Alayla, Dethlefs y.o.femalepreviously active with [place] CM for in-home assessment of potential safety concerns and DME needs/ community resource needs.   Received voicemail message from [place] requesting call back; returned her call this morning; Tiffany requested information on [facility] CM services; explained that [place] was previously active with patient and closed case in February 2018; Tiffany stated that she has been following patient for in-home assistance, as patient had been on waiting list with social services for this assistance for almost 2 years.  Tiffany reported that patient has recently developed a "small area concerning for pressure ulcer" on hip, and thought THN CM services could provide regular wound care for patient in her home; explained difference between [facility] CM services and [facility] services, and Tiffany confirmed that she had already reached out to patient's PCP to inform of patient's status, and would request home health services as well.    Tiffany declined further patient needs at this time, stating that she is currently providing care coordination for patient.  [name], RN, BSN, [employer] [facility] Care Management  [phone]

## 2017-02-22 DIAGNOSIS — Z0000 Encounter for general adult medical examination without abnormal findings: Secondary | ICD-10-CM | POA: Diagnosis not present

## 2017-02-22 DIAGNOSIS — Z23 Encounter for immunization: Secondary | ICD-10-CM | POA: Diagnosis not present

## 2017-02-22 DIAGNOSIS — E7800 Pure hypercholesterolemia, unspecified: Secondary | ICD-10-CM | POA: Diagnosis not present

## 2017-02-22 DIAGNOSIS — G8220 Paraplegia, unspecified: Secondary | ICD-10-CM | POA: Diagnosis not present

## 2017-02-22 DIAGNOSIS — Z1389 Encounter for screening for other disorder: Secondary | ICD-10-CM | POA: Diagnosis not present

## 2017-02-22 DIAGNOSIS — S80812A Abrasion, left lower leg, initial encounter: Secondary | ICD-10-CM | POA: Diagnosis not present

## 2017-02-22 DIAGNOSIS — M810 Age-related osteoporosis without current pathological fracture: Secondary | ICD-10-CM | POA: Diagnosis not present

## 2017-02-22 DIAGNOSIS — A809 Acute poliomyelitis, unspecified: Secondary | ICD-10-CM | POA: Diagnosis not present

## 2017-02-25 DIAGNOSIS — A809 Acute poliomyelitis, unspecified: Secondary | ICD-10-CM | POA: Diagnosis not present

## 2017-02-25 DIAGNOSIS — R269 Unspecified abnormalities of gait and mobility: Secondary | ICD-10-CM | POA: Diagnosis not present

## 2017-02-25 DIAGNOSIS — M810 Age-related osteoporosis without current pathological fracture: Secondary | ICD-10-CM | POA: Diagnosis not present

## 2017-04-19 ENCOUNTER — Telehealth: Payer: Self-pay

## 2017-04-19 DIAGNOSIS — M810 Age-related osteoporosis without current pathological fracture: Secondary | ICD-10-CM | POA: Diagnosis not present

## 2017-04-19 NOTE — Telephone Encounter (Signed)
Spoke to Marie Thomas about the PREP and she has not been able to join until now d/t the weather and other personal things.  I will look into how to coordinate w/her and her wheelchair and getting into the building/class.

## 2017-06-21 ENCOUNTER — Ambulatory Visit: Payer: PPO

## 2017-06-26 ENCOUNTER — Telehealth: Payer: Self-pay

## 2017-06-26 NOTE — Telephone Encounter (Signed)
Left a VM for Ms. Bonaventura about the PREP at [place] and for her to call me back if still interested in participating.

## 2017-06-30 DIAGNOSIS — Z803 Family history of malignant neoplasm of breast: Secondary | ICD-10-CM | POA: Diagnosis not present

## 2017-06-30 DIAGNOSIS — Z1231 Encounter for screening mammogram for malignant neoplasm of breast: Secondary | ICD-10-CM | POA: Diagnosis not present

## 2017-09-20 ENCOUNTER — Telehealth: Payer: Self-pay

## 2017-09-20 NOTE — Telephone Encounter (Signed)
Left a VM to see if Marie Thomas would still be interested in participating in the PREP at [place].

## 2018-10-03 DIAGNOSIS — Z0000 Encounter for general adult medical examination without abnormal findings: Secondary | ICD-10-CM | POA: Diagnosis not present

## 2018-10-03 DIAGNOSIS — E7800 Pure hypercholesterolemia, unspecified: Secondary | ICD-10-CM | POA: Diagnosis not present

## 2018-10-03 DIAGNOSIS — G8220 Paraplegia, unspecified: Secondary | ICD-10-CM | POA: Diagnosis not present

## 2018-10-03 DIAGNOSIS — R030 Elevated blood-pressure reading, without diagnosis of hypertension: Secondary | ICD-10-CM | POA: Diagnosis not present

## 2018-10-03 DIAGNOSIS — Z23 Encounter for immunization: Secondary | ICD-10-CM | POA: Diagnosis not present

## 2018-10-03 DIAGNOSIS — Z79899 Other long term (current) drug therapy: Secondary | ICD-10-CM | POA: Diagnosis not present

## 2018-11-09 DIAGNOSIS — Z803 Family history of malignant neoplasm of breast: Secondary | ICD-10-CM | POA: Diagnosis not present

## 2018-11-09 DIAGNOSIS — Z1231 Encounter for screening mammogram for malignant neoplasm of breast: Secondary | ICD-10-CM | POA: Diagnosis not present

## 2019-04-24 DIAGNOSIS — A809 Acute poliomyelitis, unspecified: Secondary | ICD-10-CM | POA: Diagnosis not present

## 2019-04-24 DIAGNOSIS — R269 Unspecified abnormalities of gait and mobility: Secondary | ICD-10-CM | POA: Diagnosis not present

## 2019-04-24 DIAGNOSIS — Z1389 Encounter for screening for other disorder: Secondary | ICD-10-CM | POA: Diagnosis not present

## 2019-04-24 DIAGNOSIS — E559 Vitamin D deficiency, unspecified: Secondary | ICD-10-CM | POA: Diagnosis not present

## 2019-04-24 DIAGNOSIS — G8220 Paraplegia, unspecified: Secondary | ICD-10-CM | POA: Diagnosis not present

## 2019-04-24 DIAGNOSIS — Z79899 Other long term (current) drug therapy: Secondary | ICD-10-CM | POA: Diagnosis not present

## 2019-04-24 DIAGNOSIS — M810 Age-related osteoporosis without current pathological fracture: Secondary | ICD-10-CM | POA: Diagnosis not present

## 2019-04-24 DIAGNOSIS — Z0000 Encounter for general adult medical examination without abnormal findings: Secondary | ICD-10-CM | POA: Diagnosis not present

## 2019-04-24 DIAGNOSIS — E7800 Pure hypercholesterolemia, unspecified: Secondary | ICD-10-CM | POA: Diagnosis not present

## 2019-04-25 ENCOUNTER — Ambulatory Visit: Payer: PPO | Attending: Internal Medicine

## 2019-04-25 DIAGNOSIS — Z23 Encounter for immunization: Secondary | ICD-10-CM

## 2019-04-25 NOTE — Progress Notes (Signed)
   Covid-19 Vaccination Clinic  Name:  DANARA BIGLEY    MRN: [id number] DOB: 1945-07-10  04/25/2019  Ms. Lierman was observed post Covid-19 immunization for 15 minutes without incident. She was provided with Vaccine Information Sheet and instruction to access the V-Safe system.   Ms. Sadoff was instructed to call 911 with any severe reactions post vaccine: Marland Kitchen Difficulty breathing  . Swelling of face and throat  . A fast heartbeat  . A bad rash all over body  . Dizziness and weakness   Immunizations Administered    Name Date Dose VIS Date Route   Pfizer COVID-19 Vaccine 04/25/2019 11:36 AM 0.3 mL 12/28/2018 Intramuscular   Manufacturer: [place]   Lot: [id number]   [place]: [id number]

## 2019-05-20 ENCOUNTER — Ambulatory Visit: Payer: PPO

## 2019-05-23 ENCOUNTER — Ambulatory Visit: Payer: PPO | Attending: Internal Medicine

## 2019-05-23 DIAGNOSIS — Z23 Encounter for immunization: Secondary | ICD-10-CM

## 2019-05-23 NOTE — Progress Notes (Signed)
   Covid-19 Vaccination Clinic  Name:  Marie Thomas    MRN: [id number] DOB: Jun 15, 1945  05/23/2019  [name] was observed post Covid-19 immunization for 15 minutes without incident. She was provided with Vaccine Information Sheet and instruction to access the V-Safe system.   [name] was instructed to call 911 with any severe reactions post vaccine: Marland Kitchen Difficulty breathing  . Swelling of face and throat  . A fast heartbeat  . A bad rash all over body  . Dizziness and weakness   Immunizations Administered    Name Date Dose VIS Date Route   Pfizer COVID-19 Vaccine 05/23/2019  8:54 AM 0.3 mL 03/13/2018 Intramuscular   Manufacturer: [place]   Lot: [id number]   [place]: [id number]

## 2019-09-09 DIAGNOSIS — A809 Acute poliomyelitis, unspecified: Secondary | ICD-10-CM | POA: Diagnosis not present

## 2019-11-11 DIAGNOSIS — Z1231 Encounter for screening mammogram for malignant neoplasm of breast: Secondary | ICD-10-CM | POA: Diagnosis not present

## 2019-12-04 DIAGNOSIS — Z961 Presence of intraocular lens: Secondary | ICD-10-CM | POA: Diagnosis not present

## 2020-01-25 DIAGNOSIS — Z993 Dependence on wheelchair: Secondary | ICD-10-CM | POA: Diagnosis not present

## 2020-01-25 DIAGNOSIS — M810 Age-related osteoporosis without current pathological fracture: Secondary | ICD-10-CM | POA: Diagnosis not present

## 2020-01-25 DIAGNOSIS — G8220 Paraplegia, unspecified: Secondary | ICD-10-CM | POA: Diagnosis not present

## 2020-01-25 DIAGNOSIS — B91 Sequelae of poliomyelitis: Secondary | ICD-10-CM | POA: Diagnosis not present

## 2020-01-25 DIAGNOSIS — E7800 Pure hypercholesterolemia, unspecified: Secondary | ICD-10-CM | POA: Diagnosis not present

## 2020-01-25 DIAGNOSIS — E559 Vitamin D deficiency, unspecified: Secondary | ICD-10-CM | POA: Diagnosis not present

## 2020-02-18 DIAGNOSIS — E7800 Pure hypercholesterolemia, unspecified: Secondary | ICD-10-CM | POA: Diagnosis not present

## 2020-02-18 DIAGNOSIS — E559 Vitamin D deficiency, unspecified: Secondary | ICD-10-CM | POA: Diagnosis not present

## 2020-02-18 DIAGNOSIS — B91 Sequelae of poliomyelitis: Secondary | ICD-10-CM | POA: Diagnosis not present

## 2020-02-18 DIAGNOSIS — M810 Age-related osteoporosis without current pathological fracture: Secondary | ICD-10-CM | POA: Diagnosis not present

## 2020-02-18 DIAGNOSIS — Z993 Dependence on wheelchair: Secondary | ICD-10-CM | POA: Diagnosis not present

## 2020-02-18 DIAGNOSIS — G8220 Paraplegia, unspecified: Secondary | ICD-10-CM | POA: Diagnosis not present

## 2020-02-25 DIAGNOSIS — A809 Acute poliomyelitis, unspecified: Secondary | ICD-10-CM | POA: Diagnosis not present

## 2020-03-17 DIAGNOSIS — B91 Sequelae of poliomyelitis: Secondary | ICD-10-CM | POA: Diagnosis not present

## 2020-03-17 DIAGNOSIS — E559 Vitamin D deficiency, unspecified: Secondary | ICD-10-CM | POA: Diagnosis not present

## 2020-03-17 DIAGNOSIS — E7800 Pure hypercholesterolemia, unspecified: Secondary | ICD-10-CM | POA: Diagnosis not present

## 2020-03-17 DIAGNOSIS — Z993 Dependence on wheelchair: Secondary | ICD-10-CM | POA: Diagnosis not present

## 2020-03-17 DIAGNOSIS — G8220 Paraplegia, unspecified: Secondary | ICD-10-CM | POA: Diagnosis not present

## 2020-03-17 DIAGNOSIS — M810 Age-related osteoporosis without current pathological fracture: Secondary | ICD-10-CM | POA: Diagnosis not present

## 2020-05-18 DIAGNOSIS — Z0000 Encounter for general adult medical examination without abnormal findings: Secondary | ICD-10-CM | POA: Diagnosis not present

## 2020-05-18 DIAGNOSIS — G8220 Paraplegia, unspecified: Secondary | ICD-10-CM | POA: Diagnosis not present

## 2020-05-18 DIAGNOSIS — R030 Elevated blood-pressure reading, without diagnosis of hypertension: Secondary | ICD-10-CM | POA: Diagnosis not present

## 2020-05-18 DIAGNOSIS — E559 Vitamin D deficiency, unspecified: Secondary | ICD-10-CM | POA: Diagnosis not present

## 2020-05-18 DIAGNOSIS — Z79899 Other long term (current) drug therapy: Secondary | ICD-10-CM | POA: Diagnosis not present

## 2020-05-18 DIAGNOSIS — Z1389 Encounter for screening for other disorder: Secondary | ICD-10-CM | POA: Diagnosis not present

## 2020-05-18 DIAGNOSIS — E7800 Pure hypercholesterolemia, unspecified: Secondary | ICD-10-CM | POA: Diagnosis not present

## 2020-05-18 DIAGNOSIS — R269 Unspecified abnormalities of gait and mobility: Secondary | ICD-10-CM | POA: Diagnosis not present

## 2020-05-18 DIAGNOSIS — M810 Age-related osteoporosis without current pathological fracture: Secondary | ICD-10-CM | POA: Diagnosis not present

## 2020-05-18 DIAGNOSIS — Z8612 Personal history of poliomyelitis: Secondary | ICD-10-CM | POA: Diagnosis not present

## 2020-05-19 ENCOUNTER — Other Ambulatory Visit: Payer: Self-pay | Admitting: Geriatric Medicine

## 2020-05-19 DIAGNOSIS — M810 Age-related osteoporosis without current pathological fracture: Secondary | ICD-10-CM

## 2020-06-12 ENCOUNTER — Other Ambulatory Visit: Payer: Self-pay | Admitting: Geriatric Medicine

## 2020-06-12 DIAGNOSIS — Z1231 Encounter for screening mammogram for malignant neoplasm of breast: Secondary | ICD-10-CM

## 2020-07-13 DIAGNOSIS — R269 Unspecified abnormalities of gait and mobility: Secondary | ICD-10-CM | POA: Diagnosis not present

## 2020-07-13 DIAGNOSIS — Z8612 Personal history of poliomyelitis: Secondary | ICD-10-CM | POA: Diagnosis not present

## 2020-07-13 DIAGNOSIS — R030 Elevated blood-pressure reading, without diagnosis of hypertension: Secondary | ICD-10-CM | POA: Diagnosis not present

## 2020-11-30 ENCOUNTER — Other Ambulatory Visit: Payer: PPO

## 2020-11-30 ENCOUNTER — Ambulatory Visit: Payer: PPO

## 2020-12-15 DIAGNOSIS — H524 Presbyopia: Secondary | ICD-10-CM | POA: Diagnosis not present

## 2020-12-15 DIAGNOSIS — H1132 Conjunctival hemorrhage, left eye: Secondary | ICD-10-CM | POA: Diagnosis not present

## 2020-12-15 DIAGNOSIS — Z961 Presence of intraocular lens: Secondary | ICD-10-CM | POA: Diagnosis not present

## 2020-12-15 DIAGNOSIS — H26493 Other secondary cataract, bilateral: Secondary | ICD-10-CM | POA: Diagnosis not present

## 2020-12-29 DIAGNOSIS — Z1231 Encounter for screening mammogram for malignant neoplasm of breast: Secondary | ICD-10-CM | POA: Diagnosis not present

## 2021-04-19 DIAGNOSIS — M810 Age-related osteoporosis without current pathological fracture: Secondary | ICD-10-CM | POA: Diagnosis not present

## 2021-04-19 DIAGNOSIS — A809 Acute poliomyelitis, unspecified: Secondary | ICD-10-CM | POA: Diagnosis not present

## 2021-04-19 DIAGNOSIS — I10 Essential (primary) hypertension: Secondary | ICD-10-CM | POA: Diagnosis not present

## 2021-04-19 DIAGNOSIS — G8220 Paraplegia, unspecified: Secondary | ICD-10-CM | POA: Diagnosis not present

## 2021-05-11 DIAGNOSIS — I10 Essential (primary) hypertension: Secondary | ICD-10-CM | POA: Diagnosis not present

## 2021-05-11 DIAGNOSIS — M810 Age-related osteoporosis without current pathological fracture: Secondary | ICD-10-CM | POA: Diagnosis not present

## 2021-05-11 DIAGNOSIS — R54 Age-related physical debility: Secondary | ICD-10-CM | POA: Diagnosis not present

## 2021-05-12 DIAGNOSIS — M810 Age-related osteoporosis without current pathological fracture: Secondary | ICD-10-CM | POA: Diagnosis not present

## 2021-05-17 DIAGNOSIS — G43109 Migraine with aura, not intractable, without status migrainosus: Secondary | ICD-10-CM | POA: Diagnosis not present

## 2021-05-17 DIAGNOSIS — M810 Age-related osteoporosis without current pathological fracture: Secondary | ICD-10-CM | POA: Diagnosis not present

## 2021-05-17 DIAGNOSIS — Z87891 Personal history of nicotine dependence: Secondary | ICD-10-CM | POA: Diagnosis not present

## 2021-05-17 DIAGNOSIS — Z90710 Acquired absence of both cervix and uterus: Secondary | ICD-10-CM | POA: Diagnosis not present

## 2021-05-17 DIAGNOSIS — Z993 Dependence on wheelchair: Secondary | ICD-10-CM | POA: Diagnosis not present

## 2021-05-17 DIAGNOSIS — A809 Acute poliomyelitis, unspecified: Secondary | ICD-10-CM | POA: Diagnosis not present

## 2021-05-17 DIAGNOSIS — Z9181 History of falling: Secondary | ICD-10-CM | POA: Diagnosis not present

## 2021-05-17 DIAGNOSIS — I10 Essential (primary) hypertension: Secondary | ICD-10-CM | POA: Diagnosis not present

## 2021-05-17 DIAGNOSIS — G8220 Paraplegia, unspecified: Secondary | ICD-10-CM | POA: Diagnosis not present

## 2021-05-19 DIAGNOSIS — A809 Acute poliomyelitis, unspecified: Secondary | ICD-10-CM | POA: Diagnosis not present

## 2021-05-19 DIAGNOSIS — G8220 Paraplegia, unspecified: Secondary | ICD-10-CM | POA: Diagnosis not present

## 2021-05-19 DIAGNOSIS — M810 Age-related osteoporosis without current pathological fracture: Secondary | ICD-10-CM | POA: Diagnosis not present

## 2021-05-19 DIAGNOSIS — Z993 Dependence on wheelchair: Secondary | ICD-10-CM | POA: Diagnosis not present

## 2021-05-19 DIAGNOSIS — R54 Age-related physical debility: Secondary | ICD-10-CM | POA: Diagnosis not present

## 2021-05-27 DIAGNOSIS — Z9181 History of falling: Secondary | ICD-10-CM | POA: Diagnosis not present

## 2021-05-27 DIAGNOSIS — Z90710 Acquired absence of both cervix and uterus: Secondary | ICD-10-CM | POA: Diagnosis not present

## 2021-05-27 DIAGNOSIS — A809 Acute poliomyelitis, unspecified: Secondary | ICD-10-CM | POA: Diagnosis not present

## 2021-05-27 DIAGNOSIS — M810 Age-related osteoporosis without current pathological fracture: Secondary | ICD-10-CM | POA: Diagnosis not present

## 2021-05-27 DIAGNOSIS — I10 Essential (primary) hypertension: Secondary | ICD-10-CM | POA: Diagnosis not present

## 2021-05-27 DIAGNOSIS — G43109 Migraine with aura, not intractable, without status migrainosus: Secondary | ICD-10-CM | POA: Diagnosis not present

## 2021-05-27 DIAGNOSIS — Z87891 Personal history of nicotine dependence: Secondary | ICD-10-CM | POA: Diagnosis not present

## 2021-05-27 DIAGNOSIS — G8220 Paraplegia, unspecified: Secondary | ICD-10-CM | POA: Diagnosis not present

## 2021-06-03 DIAGNOSIS — M810 Age-related osteoporosis without current pathological fracture: Secondary | ICD-10-CM | POA: Diagnosis not present

## 2021-06-03 DIAGNOSIS — R54 Age-related physical debility: Secondary | ICD-10-CM | POA: Diagnosis not present

## 2021-06-03 DIAGNOSIS — I10 Essential (primary) hypertension: Secondary | ICD-10-CM | POA: Diagnosis not present

## 2021-06-04 DIAGNOSIS — Z8639 Personal history of other endocrine, nutritional and metabolic disease: Secondary | ICD-10-CM | POA: Diagnosis not present

## 2021-06-04 DIAGNOSIS — M810 Age-related osteoporosis without current pathological fracture: Secondary | ICD-10-CM | POA: Diagnosis not present

## 2021-06-04 DIAGNOSIS — I10 Essential (primary) hypertension: Secondary | ICD-10-CM | POA: Diagnosis not present

## 2021-06-10 DIAGNOSIS — G8220 Paraplegia, unspecified: Secondary | ICD-10-CM | POA: Diagnosis not present

## 2021-06-10 DIAGNOSIS — Z90710 Acquired absence of both cervix and uterus: Secondary | ICD-10-CM | POA: Diagnosis not present

## 2021-06-10 DIAGNOSIS — G43109 Migraine with aura, not intractable, without status migrainosus: Secondary | ICD-10-CM | POA: Diagnosis not present

## 2021-06-10 DIAGNOSIS — M810 Age-related osteoporosis without current pathological fracture: Secondary | ICD-10-CM | POA: Diagnosis not present

## 2021-06-10 DIAGNOSIS — A809 Acute poliomyelitis, unspecified: Secondary | ICD-10-CM | POA: Diagnosis not present

## 2021-06-10 DIAGNOSIS — Z9181 History of falling: Secondary | ICD-10-CM | POA: Diagnosis not present

## 2021-06-10 DIAGNOSIS — I10 Essential (primary) hypertension: Secondary | ICD-10-CM | POA: Diagnosis not present

## 2021-06-10 DIAGNOSIS — Z87891 Personal history of nicotine dependence: Secondary | ICD-10-CM | POA: Diagnosis not present

## 2021-06-11 DIAGNOSIS — I10 Essential (primary) hypertension: Secondary | ICD-10-CM | POA: Diagnosis not present

## 2021-06-17 DIAGNOSIS — G43109 Migraine with aura, not intractable, without status migrainosus: Secondary | ICD-10-CM | POA: Diagnosis not present

## 2021-06-17 DIAGNOSIS — I10 Essential (primary) hypertension: Secondary | ICD-10-CM | POA: Diagnosis not present

## 2021-06-17 DIAGNOSIS — Z87891 Personal history of nicotine dependence: Secondary | ICD-10-CM | POA: Diagnosis not present

## 2021-06-17 DIAGNOSIS — Z90710 Acquired absence of both cervix and uterus: Secondary | ICD-10-CM | POA: Diagnosis not present

## 2021-06-17 DIAGNOSIS — M810 Age-related osteoporosis without current pathological fracture: Secondary | ICD-10-CM | POA: Diagnosis not present

## 2021-06-17 DIAGNOSIS — G8220 Paraplegia, unspecified: Secondary | ICD-10-CM | POA: Diagnosis not present

## 2021-06-17 DIAGNOSIS — A809 Acute poliomyelitis, unspecified: Secondary | ICD-10-CM | POA: Diagnosis not present

## 2021-06-17 DIAGNOSIS — Z9181 History of falling: Secondary | ICD-10-CM | POA: Diagnosis not present

## 2021-07-15 DIAGNOSIS — E7800 Pure hypercholesterolemia, unspecified: Secondary | ICD-10-CM | POA: Diagnosis not present

## 2021-07-15 DIAGNOSIS — R54 Age-related physical debility: Secondary | ICD-10-CM | POA: Diagnosis not present

## 2021-07-15 DIAGNOSIS — Z8639 Personal history of other endocrine, nutritional and metabolic disease: Secondary | ICD-10-CM | POA: Diagnosis not present

## 2021-07-15 DIAGNOSIS — I10 Essential (primary) hypertension: Secondary | ICD-10-CM | POA: Diagnosis not present

## 2021-07-16 DIAGNOSIS — I10 Essential (primary) hypertension: Secondary | ICD-10-CM | POA: Diagnosis not present

## 2021-07-16 DIAGNOSIS — R54 Age-related physical debility: Secondary | ICD-10-CM | POA: Diagnosis not present

## 2021-07-16 DIAGNOSIS — M810 Age-related osteoporosis without current pathological fracture: Secondary | ICD-10-CM | POA: Diagnosis not present

## 2021-08-05 DIAGNOSIS — Z9181 History of falling: Secondary | ICD-10-CM | POA: Diagnosis not present

## 2021-08-05 DIAGNOSIS — Z87891 Personal history of nicotine dependence: Secondary | ICD-10-CM | POA: Diagnosis not present

## 2021-08-05 DIAGNOSIS — G43109 Migraine with aura, not intractable, without status migrainosus: Secondary | ICD-10-CM | POA: Diagnosis not present

## 2021-08-05 DIAGNOSIS — Z90710 Acquired absence of both cervix and uterus: Secondary | ICD-10-CM | POA: Diagnosis not present

## 2021-08-05 DIAGNOSIS — G8220 Paraplegia, unspecified: Secondary | ICD-10-CM | POA: Diagnosis not present

## 2021-08-05 DIAGNOSIS — M810 Age-related osteoporosis without current pathological fracture: Secondary | ICD-10-CM | POA: Diagnosis not present

## 2021-08-05 DIAGNOSIS — Z993 Dependence on wheelchair: Secondary | ICD-10-CM | POA: Diagnosis not present

## 2021-08-05 DIAGNOSIS — A809 Acute poliomyelitis, unspecified: Secondary | ICD-10-CM | POA: Diagnosis not present

## 2021-08-05 DIAGNOSIS — I10 Essential (primary) hypertension: Secondary | ICD-10-CM | POA: Diagnosis not present

## 2021-08-13 DIAGNOSIS — M810 Age-related osteoporosis without current pathological fracture: Secondary | ICD-10-CM | POA: Diagnosis not present

## 2021-08-13 DIAGNOSIS — R54 Age-related physical debility: Secondary | ICD-10-CM | POA: Diagnosis not present

## 2021-08-13 DIAGNOSIS — I10 Essential (primary) hypertension: Secondary | ICD-10-CM | POA: Diagnosis not present

## 2021-08-13 DIAGNOSIS — E7800 Pure hypercholesterolemia, unspecified: Secondary | ICD-10-CM | POA: Diagnosis not present

## 2021-08-19 DIAGNOSIS — M810 Age-related osteoporosis without current pathological fracture: Secondary | ICD-10-CM | POA: Diagnosis not present

## 2021-08-19 DIAGNOSIS — Z993 Dependence on wheelchair: Secondary | ICD-10-CM | POA: Diagnosis not present

## 2021-08-19 DIAGNOSIS — I10 Essential (primary) hypertension: Secondary | ICD-10-CM | POA: Diagnosis not present

## 2021-08-19 DIAGNOSIS — G8220 Paraplegia, unspecified: Secondary | ICD-10-CM | POA: Diagnosis not present

## 2021-08-19 DIAGNOSIS — G43109 Migraine with aura, not intractable, without status migrainosus: Secondary | ICD-10-CM | POA: Diagnosis not present

## 2021-08-19 DIAGNOSIS — Z87891 Personal history of nicotine dependence: Secondary | ICD-10-CM | POA: Diagnosis not present

## 2021-08-19 DIAGNOSIS — Z9181 History of falling: Secondary | ICD-10-CM | POA: Diagnosis not present

## 2021-08-19 DIAGNOSIS — Z90710 Acquired absence of both cervix and uterus: Secondary | ICD-10-CM | POA: Diagnosis not present

## 2021-08-19 DIAGNOSIS — A809 Acute poliomyelitis, unspecified: Secondary | ICD-10-CM | POA: Diagnosis not present

## 2021-09-08 DIAGNOSIS — E871 Hypo-osmolality and hyponatremia: Secondary | ICD-10-CM | POA: Diagnosis not present

## 2021-09-08 DIAGNOSIS — Z8639 Personal history of other endocrine, nutritional and metabolic disease: Secondary | ICD-10-CM | POA: Diagnosis not present

## 2021-09-08 DIAGNOSIS — M810 Age-related osteoporosis without current pathological fracture: Secondary | ICD-10-CM | POA: Diagnosis not present

## 2021-09-08 DIAGNOSIS — E7801 Familial hypercholesterolemia: Secondary | ICD-10-CM | POA: Diagnosis not present

## 2021-09-08 DIAGNOSIS — I10 Essential (primary) hypertension: Secondary | ICD-10-CM | POA: Diagnosis not present

## 2021-09-16 DIAGNOSIS — E7800 Pure hypercholesterolemia, unspecified: Secondary | ICD-10-CM | POA: Diagnosis not present

## 2021-09-16 DIAGNOSIS — M810 Age-related osteoporosis without current pathological fracture: Secondary | ICD-10-CM | POA: Diagnosis not present

## 2021-09-16 DIAGNOSIS — I10 Essential (primary) hypertension: Secondary | ICD-10-CM | POA: Diagnosis not present

## 2021-09-16 DIAGNOSIS — R54 Age-related physical debility: Secondary | ICD-10-CM | POA: Diagnosis not present

## 2021-09-23 DIAGNOSIS — G8220 Paraplegia, unspecified: Secondary | ICD-10-CM | POA: Diagnosis not present

## 2021-09-23 DIAGNOSIS — M810 Age-related osteoporosis without current pathological fracture: Secondary | ICD-10-CM | POA: Diagnosis not present

## 2021-09-23 DIAGNOSIS — A809 Acute poliomyelitis, unspecified: Secondary | ICD-10-CM | POA: Diagnosis not present

## 2021-09-27 DIAGNOSIS — Z993 Dependence on wheelchair: Secondary | ICD-10-CM | POA: Diagnosis not present

## 2021-09-27 DIAGNOSIS — A809 Acute poliomyelitis, unspecified: Secondary | ICD-10-CM | POA: Diagnosis not present

## 2021-09-27 DIAGNOSIS — G43109 Migraine with aura, not intractable, without status migrainosus: Secondary | ICD-10-CM | POA: Diagnosis not present

## 2021-09-27 DIAGNOSIS — Z90710 Acquired absence of both cervix and uterus: Secondary | ICD-10-CM | POA: Diagnosis not present

## 2021-09-27 DIAGNOSIS — M810 Age-related osteoporosis without current pathological fracture: Secondary | ICD-10-CM | POA: Diagnosis not present

## 2021-09-27 DIAGNOSIS — G8220 Paraplegia, unspecified: Secondary | ICD-10-CM | POA: Diagnosis not present

## 2021-09-27 DIAGNOSIS — Z87891 Personal history of nicotine dependence: Secondary | ICD-10-CM | POA: Diagnosis not present

## 2021-09-27 DIAGNOSIS — I10 Essential (primary) hypertension: Secondary | ICD-10-CM | POA: Diagnosis not present

## 2021-09-27 DIAGNOSIS — Z9181 History of falling: Secondary | ICD-10-CM | POA: Diagnosis not present

## 2021-10-13 DIAGNOSIS — E7800 Pure hypercholesterolemia, unspecified: Secondary | ICD-10-CM | POA: Diagnosis not present

## 2021-10-13 DIAGNOSIS — I10 Essential (primary) hypertension: Secondary | ICD-10-CM | POA: Diagnosis not present

## 2021-10-13 DIAGNOSIS — Z8639 Personal history of other endocrine, nutritional and metabolic disease: Secondary | ICD-10-CM | POA: Diagnosis not present

## 2021-10-15 DIAGNOSIS — I10 Essential (primary) hypertension: Secondary | ICD-10-CM | POA: Diagnosis not present

## 2021-10-15 DIAGNOSIS — R54 Age-related physical debility: Secondary | ICD-10-CM | POA: Diagnosis not present

## 2021-10-15 DIAGNOSIS — M810 Age-related osteoporosis without current pathological fracture: Secondary | ICD-10-CM | POA: Diagnosis not present

## 2021-10-15 DIAGNOSIS — E7800 Pure hypercholesterolemia, unspecified: Secondary | ICD-10-CM | POA: Diagnosis not present

## 2021-10-21 DIAGNOSIS — G8220 Paraplegia, unspecified: Secondary | ICD-10-CM | POA: Diagnosis not present

## 2021-10-21 DIAGNOSIS — Z90710 Acquired absence of both cervix and uterus: Secondary | ICD-10-CM | POA: Diagnosis not present

## 2021-10-21 DIAGNOSIS — M810 Age-related osteoporosis without current pathological fracture: Secondary | ICD-10-CM | POA: Diagnosis not present

## 2021-10-21 DIAGNOSIS — I10 Essential (primary) hypertension: Secondary | ICD-10-CM | POA: Diagnosis not present

## 2021-10-21 DIAGNOSIS — Z9181 History of falling: Secondary | ICD-10-CM | POA: Diagnosis not present

## 2021-10-21 DIAGNOSIS — A809 Acute poliomyelitis, unspecified: Secondary | ICD-10-CM | POA: Diagnosis not present

## 2021-10-21 DIAGNOSIS — G43109 Migraine with aura, not intractable, without status migrainosus: Secondary | ICD-10-CM | POA: Diagnosis not present

## 2021-10-21 DIAGNOSIS — Z993 Dependence on wheelchair: Secondary | ICD-10-CM | POA: Diagnosis not present

## 2021-10-21 DIAGNOSIS — Z87891 Personal history of nicotine dependence: Secondary | ICD-10-CM | POA: Diagnosis not present

## 2021-10-28 DIAGNOSIS — Z87891 Personal history of nicotine dependence: Secondary | ICD-10-CM | POA: Diagnosis not present

## 2021-10-28 DIAGNOSIS — I10 Essential (primary) hypertension: Secondary | ICD-10-CM | POA: Diagnosis not present

## 2021-10-28 DIAGNOSIS — A809 Acute poliomyelitis, unspecified: Secondary | ICD-10-CM | POA: Diagnosis not present

## 2021-10-28 DIAGNOSIS — G8220 Paraplegia, unspecified: Secondary | ICD-10-CM | POA: Diagnosis not present

## 2021-10-28 DIAGNOSIS — G43109 Migraine with aura, not intractable, without status migrainosus: Secondary | ICD-10-CM | POA: Diagnosis not present

## 2021-10-28 DIAGNOSIS — Z993 Dependence on wheelchair: Secondary | ICD-10-CM | POA: Diagnosis not present

## 2021-10-28 DIAGNOSIS — Z90710 Acquired absence of both cervix and uterus: Secondary | ICD-10-CM | POA: Diagnosis not present

## 2021-10-28 DIAGNOSIS — Z9181 History of falling: Secondary | ICD-10-CM | POA: Diagnosis not present

## 2021-10-28 DIAGNOSIS — M810 Age-related osteoporosis without current pathological fracture: Secondary | ICD-10-CM | POA: Diagnosis not present

## 2021-11-04 DIAGNOSIS — I10 Essential (primary) hypertension: Secondary | ICD-10-CM | POA: Diagnosis not present

## 2021-11-04 DIAGNOSIS — M810 Age-related osteoporosis without current pathological fracture: Secondary | ICD-10-CM | POA: Diagnosis not present

## 2021-11-04 DIAGNOSIS — R54 Age-related physical debility: Secondary | ICD-10-CM | POA: Diagnosis not present

## 2021-11-04 DIAGNOSIS — E7800 Pure hypercholesterolemia, unspecified: Secondary | ICD-10-CM | POA: Diagnosis not present

## 2021-12-03 DIAGNOSIS — M810 Age-related osteoporosis without current pathological fracture: Secondary | ICD-10-CM | POA: Diagnosis not present

## 2021-12-03 DIAGNOSIS — E7800 Pure hypercholesterolemia, unspecified: Secondary | ICD-10-CM | POA: Diagnosis not present

## 2021-12-03 DIAGNOSIS — R54 Age-related physical debility: Secondary | ICD-10-CM | POA: Diagnosis not present

## 2021-12-03 DIAGNOSIS — I10 Essential (primary) hypertension: Secondary | ICD-10-CM | POA: Diagnosis not present

## 2021-12-08 DIAGNOSIS — I10 Essential (primary) hypertension: Secondary | ICD-10-CM | POA: Diagnosis not present

## 2021-12-08 DIAGNOSIS — R54 Age-related physical debility: Secondary | ICD-10-CM | POA: Diagnosis not present

## 2021-12-08 DIAGNOSIS — Z8639 Personal history of other endocrine, nutritional and metabolic disease: Secondary | ICD-10-CM | POA: Diagnosis not present

## 2021-12-28 DIAGNOSIS — H524 Presbyopia: Secondary | ICD-10-CM | POA: Diagnosis not present

## 2021-12-28 DIAGNOSIS — H26493 Other secondary cataract, bilateral: Secondary | ICD-10-CM | POA: Diagnosis not present

## 2021-12-28 DIAGNOSIS — Z961 Presence of intraocular lens: Secondary | ICD-10-CM | POA: Diagnosis not present

## 2022-01-04 DIAGNOSIS — E7800 Pure hypercholesterolemia, unspecified: Secondary | ICD-10-CM | POA: Diagnosis not present

## 2022-01-04 DIAGNOSIS — M810 Age-related osteoporosis without current pathological fracture: Secondary | ICD-10-CM | POA: Diagnosis not present

## 2022-01-04 DIAGNOSIS — R54 Age-related physical debility: Secondary | ICD-10-CM | POA: Diagnosis not present

## 2022-01-04 DIAGNOSIS — I10 Essential (primary) hypertension: Secondary | ICD-10-CM | POA: Diagnosis not present

## 2022-01-05 DIAGNOSIS — R54 Age-related physical debility: Secondary | ICD-10-CM | POA: Diagnosis not present

## 2022-01-05 DIAGNOSIS — G8220 Paraplegia, unspecified: Secondary | ICD-10-CM | POA: Diagnosis not present

## 2022-01-05 DIAGNOSIS — I10 Essential (primary) hypertension: Secondary | ICD-10-CM | POA: Diagnosis not present

## 2022-01-05 DIAGNOSIS — Z8639 Personal history of other endocrine, nutritional and metabolic disease: Secondary | ICD-10-CM | POA: Diagnosis not present

## 2022-01-05 DIAGNOSIS — M810 Age-related osteoporosis without current pathological fracture: Secondary | ICD-10-CM | POA: Diagnosis not present

## 2023-02-01 ENCOUNTER — Encounter: Payer: Self-pay | Admitting: Internal Medicine

## 2023-02-01 ENCOUNTER — Other Ambulatory Visit: Payer: Self-pay | Admitting: Internal Medicine

## 2023-02-01 DIAGNOSIS — M810 Age-related osteoporosis without current pathological fracture: Secondary | ICD-10-CM

## 2023-02-01 DIAGNOSIS — Z780 Asymptomatic menopausal state: Secondary | ICD-10-CM

## 2023-09-25 ENCOUNTER — Other Ambulatory Visit (HOSPITAL_COMMUNITY): Payer: Self-pay

## 2023-09-25 ENCOUNTER — Other Ambulatory Visit: Payer: Self-pay

## 2023-10-05 ENCOUNTER — Other Ambulatory Visit (HOSPITAL_COMMUNITY): Payer: Self-pay

## 2023-10-05 ENCOUNTER — Other Ambulatory Visit: Payer: Self-pay

## 2023-10-05 MED ORDER — LISINOPRIL 20 MG PO TABS
20.0000 mg | ORAL_TABLET | Freq: Every day | ORAL | 1 refills | Status: AC
  Filled 2023-10-05: qty 90, 90d supply, fill #0
  Filled 2023-12-28: qty 90, 90d supply, fill #1

## 2023-11-30 ENCOUNTER — Other Ambulatory Visit

## 2023-12-28 ENCOUNTER — Other Ambulatory Visit (HOSPITAL_COMMUNITY): Payer: Self-pay

## 2024-02-06 ENCOUNTER — Encounter (HOSPITAL_COMMUNITY): Payer: Self-pay

## 2024-02-06 ENCOUNTER — Emergency Department: Payer: Self-pay

## 2024-02-06 ENCOUNTER — Other Ambulatory Visit: Payer: Self-pay

## 2024-02-06 ENCOUNTER — Observation Stay (HOSPITAL_COMMUNITY)
Admission: EM | Admit: 2024-02-06 | Discharge: 2024-02-14 | Disposition: A | Discharge status: Skilled Nursing Facility | Attending: Emergency Medicine | Admitting: Emergency Medicine

## 2024-02-06 DIAGNOSIS — Y929 Unspecified place or not applicable: Secondary | ICD-10-CM | POA: Insufficient documentation

## 2024-02-06 DIAGNOSIS — S72452A Displaced supracondylar fracture without intracondylar extension of lower end of left femur, initial encounter for closed fracture: Secondary | ICD-10-CM | POA: Insufficient documentation

## 2024-02-06 DIAGNOSIS — W050XXA Fall from non-moving wheelchair, initial encounter: Secondary | ICD-10-CM | POA: Insufficient documentation

## 2024-02-06 DIAGNOSIS — Z8669 Personal history of other diseases of the nervous system and sense organs: Secondary | ICD-10-CM | POA: Insufficient documentation

## 2024-02-06 DIAGNOSIS — M25061 Hemarthrosis, right knee: Secondary | ICD-10-CM | POA: Diagnosis not present

## 2024-02-06 DIAGNOSIS — E876 Hypokalemia: Secondary | ICD-10-CM | POA: Diagnosis not present

## 2024-02-06 DIAGNOSIS — M25062 Hemarthrosis, left knee: Secondary | ICD-10-CM | POA: Insufficient documentation

## 2024-02-06 DIAGNOSIS — I10 Essential (primary) hypertension: Secondary | ICD-10-CM | POA: Insufficient documentation

## 2024-02-06 DIAGNOSIS — S72409A Unspecified fracture of lower end of unspecified femur, initial encounter for closed fracture: Principal | ICD-10-CM

## 2024-02-06 DIAGNOSIS — S72451A Displaced supracondylar fracture without intracondylar extension of lower end of right femur, initial encounter for closed fracture: Principal | ICD-10-CM | POA: Insufficient documentation

## 2024-02-06 DIAGNOSIS — Z79899 Other long term (current) drug therapy: Secondary | ICD-10-CM | POA: Insufficient documentation

## 2024-02-06 DIAGNOSIS — S7290XA Unspecified fracture of unspecified femur, initial encounter for closed fracture: Secondary | ICD-10-CM | POA: Diagnosis present

## 2024-02-06 MED ORDER — OXYCODONE HCL 5 MG PO TABS
5.0000 mg | ORAL_TABLET | Freq: Once | ORAL | Status: AC
  Administered 2024-02-06 – 2024-02-07 (×2): 5 mg via ORAL
  Filled 2024-02-06: qty 1

## 2024-02-06 MED ORDER — ACETAMINOPHEN 325 MG PO TABS
650.0000 mg | ORAL_TABLET | Freq: Once | ORAL | Status: AC
  Administered 2024-02-06: 650 mg via ORAL
  Filled 2024-02-06: qty 2

## 2024-02-06 NOTE — ED Triage Notes (Signed)
 Pt arrived via PT[facility] from niece's car. Pt is wheelchair bound at baseline. Pt stood up from wheelchair and niece was helping pt pull pants up and pt fell on knees bilat. Pt was seen at a hospital in [facility] and was dx w/bilat femur fractures. Pt lives by herself and takes care of herself, but unable to right now with the femur fractures.

## 2024-02-06 NOTE — ED Provider Notes (Signed)
 " [facility] Provider Note   CSN: [id number] Arrival date & time: 02/06/24  2219     Patient presents with: femur fractures  and placement issue   Marie Thomas is a 79 y.o. female with PMHx headaches, HTN, poliomyelitis w/ paraplegia of BL LE, anemia who presents to ED concerned for pain control. Patient stating that she had a mechanical fall yesterday while attempting to put her jeans on and fell onto BL knees. Patient was seen at another facility in [place], [place] and was diagnosed with BL distal femur fractures. Patient was placed in BL knee immobilizers and states that the other facility did not want to perform surgery on patient since she is not ambulatory at baseline. Patient was prescribed 5mg  oxycodone  which is not helping with pain. Also taking Tylenol  and Advil  Patient stating that pain is so severe that she cannot be complaint with outpatient follow up appointment's d/t the pain with movement and getting into a car.   Patient denies other complaints today such as fever, chest pain, SOB, vomiting.   {Add pertinent medical, surgical, social history, OB history to HPI:32947} HPI     Prior to Admission medications  Medication Sig Start Date End Date Taking? Authorizing Provider  acetaminophen  (TYLENOL ) 500 MG tablet Take 500 mg by mouth as needed for mild pain.     [provider]  Cholecalciferol  (VITAMIN D) 2000 UNITS tablet Take 2,000 Units by mouth daily.    [provider]  docusate sodium  (COLACE) 100 MG capsule Take 1 capsule (100 mg total) by mouth every 12 (twelve) hours. Patient taking differently: Take 100 mg by mouth 2 (two) times daily as needed for moderate constipation.  11/13/13   Freddi Hamilton, MD  feeding supplement, ENSURE ENLIVE, (ENSURE ENLIVE) LIQD Take 237 mLs by mouth 2 (two) times daily between meals. 04/25/14   Hongalgi, Anand D, MD  fluconazole  (DIFLUCAN ) 150 MG tablet Take 1 tablet (150 mg  total) by mouth once. Patient not taking: Reported on 01/13/2016 06/05/14   Matilda Senior, MD  HYDROcodone -acetaminophen  (NORCO) 5-325 MG per tablet Take 1-2 tablets by mouth every 4 (four) hours as needed for moderate pain. Patient not taking: Reported on 01/13/2016 06/05/14   Matilda Senior, MD  HYDROcodone -acetaminophen  (NORCO/VICODIN) 5-325 MG per tablet Take 1-2 tablets by mouth every 6 (six) hours as needed for moderate pain or severe pain. Patient not taking: Reported on 01/13/2016 04/25/14   Judeth Trenda BIRCH, MD  lisinopril  (ZESTRIL ) 20 MG tablet Take 1 tablet (20 mg total) by mouth daily. 10/05/23       Allergies: Patient has no known allergies.    Review of Systems  Updated Vital Signs BP (!) 176/88   Pulse 83   Temp 98.4 F (36.9 C) (Oral)   Resp 17   SpO2 91%   Physical Exam  (all labs ordered are listed, but only abnormal results are displayed) Labs Reviewed - No data to display  EKG: None  Radiology: No results found.  {Document cardiac monitor, telemetry assessment procedure when appropriate:32947} Procedures   Medications Ordered in the ED - No data to display    {Click here for ABCD2, HEART and other calculators REFRESH Note before signing:1}                              Medical Decision Making Risk OTC drugs. Prescription drug management.   This patient presents to  the ED for concern of knee pain, this involves an extensive number of treatment options, and is a complaint that carries with it a high risk of complications and morbidity.  The differential diagnosis includes hemarthrosis, gout, septic joint, fracture, tendonitis, muscle strain, bursitis, compartment syndrome   Co morbidities that complicate the patient evaluation  headaches, HTN, poliomyelitis w/ paraplegia of BL LE, anemia   Additional history obtained:  Additional history obtained from CD containing xray images from outpatient facility   Problem List / ED Course /  Critical interventions / Medication management  *** I Ordered, and personally interpreted labs.  The pertinent results include:  *** I ordered imaging studies including ***. I independently visualized and interpreted imaging. I agree with the radiologist interpretation. Shared results with patient. I requested consultation with the ***,  and discussed lab and imaging findings as well as pertinent plan - they recommend: *** I have reviewed the patients home medicines and have made adjustments as needed The patient has been appropriately medically screened and/or stabilized in the ED. I have low suspicion for any other emergent medical condition which would require further screening, evaluation or treatment in the ED or require inpatient management. At time of discharge the patient is hemodynamically stable and in no acute distress. I have discussed work-up results and diagnosis with patient and answered all questions. Patient is agreeable with discharge plan. We discussed strict return precautions for returning to the emergency department and they verbalized understanding.     Social Determinants of Health:  geriatric   {Document critical care time when appropriate  Document review of labs and clinical decision tools ie CHADS2VASC2, etc  Document your independent review of radiology images and any outside records  Document your discussion with family members, caretakers and with consultants  Document social determinants of health affecting pt's care  Document your decision making why or why not admission, treatments were needed:32947:::1}   Final diagnoses:  None    ED Discharge Orders     None        "

## 2024-02-07 ENCOUNTER — Emergency Department (HOSPITAL_COMMUNITY)

## 2024-02-07 DIAGNOSIS — S7290XA Unspecified fracture of unspecified femur, initial encounter for closed fracture: Secondary | ICD-10-CM | POA: Diagnosis present

## 2024-02-07 LAB — CBC WITH DIFFERENTIAL/PLATELET
Abs Immature Granulocytes: 0.04 K/uL (ref 0.00–0.07)
Basophils Absolute: 0 K/uL (ref 0.0–0.1)
Basophils Relative: 0 %
Eosinophils Absolute: 0.1 K/uL (ref 0.0–0.5)
Eosinophils Relative: 1 %
HCT: 44.9 % (ref 36.0–46.0)
Hemoglobin: 14.6 g/dL (ref 12.0–15.0)
Immature Granulocytes: 0 %
Lymphocytes Relative: 14 %
Lymphs Abs: 1.4 K/uL (ref 0.7–4.0)
MCH: 30 pg (ref 26.0–34.0)
MCHC: 32.5 g/dL (ref 30.0–36.0)
MCV: 92.4 fL (ref 80.0–100.0)
Monocytes Absolute: 0.9 K/uL (ref 0.1–1.0)
Monocytes Relative: 9 %
Neutro Abs: 7.2 K/uL (ref 1.7–7.7)
Neutrophils Relative %: 76 %
Platelets: 207 K/uL (ref 150–400)
RBC: 4.86 MIL/uL (ref 3.87–5.11)
RDW: 15.1 % (ref 11.5–15.5)
WBC: 9.6 K/uL (ref 4.0–10.5)
nRBC: 0 % (ref 0.0–0.2)

## 2024-02-07 LAB — I-STAT CHEM 8, ED
BUN: 15 mg/dL (ref 8–23)
Calcium, Ion: 1.1 mmol/L — ABNORMAL LOW (ref 1.15–1.40)
Chloride: 106 mmol/L (ref 98–111)
Creatinine, Ser: 0.6 mg/dL (ref 0.44–1.00)
Glucose, Bld: 184 mg/dL — ABNORMAL HIGH (ref 70–99)
HCT: 43 % (ref 36.0–46.0)
Hemoglobin: 14.6 g/dL (ref 12.0–15.0)
Potassium: 3.2 mmol/L — ABNORMAL LOW (ref 3.5–5.1)
Sodium: 143 mmol/L (ref 135–145)
TCO2: 22 mmol/L (ref 22–32)

## 2024-02-07 LAB — BASIC METABOLIC PANEL WITH GFR
Anion gap: 12 (ref 5–15)
BUN: 15 mg/dL (ref 8–23)
CO2: 26 mmol/L (ref 22–32)
Calcium: 8.9 mg/dL (ref 8.9–10.3)
Chloride: 105 mmol/L (ref 98–111)
Creatinine, Ser: 0.53 mg/dL (ref 0.44–1.00)
GFR, Estimated: >60 mL/min
Glucose, Bld: 149 mg/dL — ABNORMAL HIGH (ref 70–99)
Potassium: 3.4 mmol/L — ABNORMAL LOW (ref 3.5–5.1)
Sodium: 143 mmol/L (ref 135–145)

## 2024-02-07 MED ORDER — ACETAMINOPHEN 325 MG PO TABS
650.0000 mg | ORAL_TABLET | Freq: Four times a day (QID) | ORAL | Status: DC | PRN
  Administered 2024-02-07: 650 mg via ORAL
  Filled 2024-02-07: qty 2

## 2024-02-07 MED ORDER — ONDANSETRON HCL 4 MG/2ML IJ SOLN: 4.0000 mg | Freq: Once | INTRAMUSCULAR | Status: DC

## 2024-02-07 MED ORDER — NALOXONE HCL 0.4 MG/ML IJ SOLN: 0.4000 mg | INTRAMUSCULAR | Status: DC | PRN

## 2024-02-07 MED ORDER — MORPHINE SULFATE (PF) 4 MG/ML IV SOLN: 4.0000 mg | Freq: Once | INTRAVENOUS | Status: DC

## 2024-02-07 MED ORDER — SODIUM CHLORIDE 0.9 % IV SOLN: INTRAVENOUS | Status: DC

## 2024-02-07 MED ORDER — ACETAMINOPHEN 500 MG PO TABS
1000.0000 mg | ORAL_TABLET | Freq: Three times a day (TID) | ORAL | Status: DC
  Administered 2024-02-07 – 2024-02-14 (×20): 1000 mg via ORAL
  Filled 2024-02-07 (×21): qty 2

## 2024-02-07 MED ORDER — POTASSIUM CHLORIDE CRYS ER 20 MEQ PO TBCR
40.0000 meq | EXTENDED_RELEASE_TABLET | Freq: Once | ORAL | Status: AC
  Administered 2024-02-07: 40 meq via ORAL
  Filled 2024-02-07: qty 2

## 2024-02-07 MED ORDER — IBUPROFEN 200 MG PO TABS
400.0000 mg | ORAL_TABLET | Freq: Four times a day (QID) | ORAL | Status: DC | PRN
  Administered 2024-02-07 – 2024-02-12 (×5): 400 mg via ORAL
  Filled 2024-02-07 (×5): qty 2

## 2024-02-07 MED ORDER — OXYCODONE HCL 5 MG PO TABS
10.0000 mg | ORAL_TABLET | Freq: Four times a day (QID) | ORAL | Status: DC | PRN
  Administered 2024-02-07 – 2024-02-10 (×8): 10 mg via ORAL
  Filled 2024-02-07 (×8): qty 2

## 2024-02-07 MED ORDER — OXYCODONE HCL 5 MG PO TABS: 10.0000 mg | ORAL_TABLET | Freq: Four times a day (QID) | ORAL | Status: DC | PRN

## 2024-02-07 MED ORDER — HYDRALAZINE HCL 20 MG/ML IJ SOLN
5.0000 mg | INTRAMUSCULAR | Status: DC | PRN
  Filled 2024-02-07: qty 1

## 2024-02-07 MED ORDER — PANTOPRAZOLE SODIUM 40 MG PO TBEC
40.0000 mg | DELAYED_RELEASE_TABLET | Freq: Every day | ORAL | Status: DC
  Administered 2024-02-07 – 2024-02-14 (×8): 40 mg via ORAL
  Filled 2024-02-07 (×8): qty 1

## 2024-02-07 MED ORDER — OXYCODONE HCL 5 MG PO TABS
5.0000 mg | ORAL_TABLET | Freq: Four times a day (QID) | ORAL | Status: DC | PRN
  Administered 2024-02-07: 5 mg via ORAL
  Filled 2024-02-07 (×2): qty 1

## 2024-02-07 MED ORDER — LISINOPRIL 20 MG PO TABS
20.0000 mg | ORAL_TABLET | Freq: Every day | ORAL | Status: DC
  Administered 2024-02-07 – 2024-02-14 (×8): 20 mg via ORAL
  Filled 2024-02-07 (×8): qty 1

## 2024-02-07 MED ORDER — ACETAMINOPHEN 650 MG RE SUPP: 650.0000 mg | Freq: Four times a day (QID) | RECTAL | Status: DC | PRN

## 2024-02-07 MED ORDER — MORPHINE SULFATE (PF) 2 MG/ML IV SOLN: 1.0000 mg | INTRAVENOUS | Status: DC | PRN

## 2024-02-07 NOTE — Plan of Care (Signed)
  Problem: Clinical Measurements: Goal: Respiratory complications will improve Outcome: Progressing Goal: Cardiovascular complication will be avoided Outcome: Progressing   Problem: Activity: Goal: Risk for activity intolerance will decrease Outcome: Progressing   Problem: Nutrition: Goal: Adequate nutrition will be maintained Outcome: Progressing   Problem: Coping: Goal: Level of anxiety will decrease Outcome: Progressing   Problem: Pain Managment: Goal: General experience of comfort will improve and/or be controlled Outcome: Progressing

## 2024-02-07 NOTE — Consult Note (Signed)
 Reason for Consult:Bilateral distal femur fxs Referring Physician: Vernal Pokhrel Time called: 0730 Time at bedside: 0834   Marie Thomas is an 79 y.o. female.  HPI: Kaydence fell on Monday while trying to transfer. She had immediate bilateral knee pain and was taken to [facility] where x-rays showed bilateral distal femur fxs. Orthopedics recommended non-operative treatment with KI and NWB and she was discharged home but was brought to [facility] with undertreated pain and orthopedic surgery was consulted here. She is paraplegic but can use her legs in braces to help transfer. She lives alone.  Past Medical History:  Diagnosis Date   H/O steroid therapy    Steroid use -last used 1 week ago   Headache    migraines-have occurred less since retired '99.   Hypertension    Neuromuscular disorder (HCC)    polio- no mobility lower extremities bilaterally   Pain at rest    abdominal pain, right side- intermittentdx. 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-wears braces bilaterally, no weight bearing at present, wheelchair bound   Polio     Past Surgical History:  Procedure Laterality Date   ABDOMINAL HYSTERECTOMY     CYSTOSCOPY W/ URETERAL STENT PLACEMENT Right 06/05/2014   Procedure: CYSTOSCOPY WITH STENT EXCHANGE , RETROGRADE PYLEOGRAM;  Surgeon: Garnette Shack, MD;  Location: [facility];  Service: Urology;  Laterality: Right;   CYSTOSCOPY WITH STENT PLACEMENT Right 04/28/2014   Procedure: CYSTOSCOPY WITH RIGHT RETROGRADE AND STENT PLACEMENT;  Surgeon: Garnette Shack, MD;  Location: [facility];  Service: Urology;  Laterality: Right;   IR GENERIC HISTORICAL  02/20/2014   IR RADIOLOGIST EVAL & MGMT 02/20/2014 Wilkie Lent, MD [facility] INTERV RAD   IR GENERIC HISTORICAL  03/20/2014   IR RADIOLOGIST EVAL & MGMT 03/20/2014 Wilkie Lent, MD [facility] INTERV RAD   LEG TENDON SURGERY     age 69 or 79 yrs old    Family History  Problem Relation Age of Onset    Heart disease Brother    Prostate cancer Brother    Breast cancer Mother     Social History:  reports that she has never smoked. She has never used smokeless tobacco. She reports current alcohol use. She reports that she does not use drugs.  Allergies: Allergies[1]  Medications: I have reviewed the patient's current medications.  Results for orders placed or performed during the hospital encounter of 02/06/24 (from the past 48 hours)  I-stat chem 8, ED (not at [facility], [facility] or [facility])     Status: Abnormal   Collection Time: 02/07/24 12:48 AM  Result Value Ref Range   Sodium 143 135 - 145 mmol/L   Potassium 3.2 (L) 3.5 - 5.1 mmol/L   Chloride 106 98 - 111 mmol/L   BUN 15 8 - 23 mg/dL   Creatinine, Ser 9.39 0.44 - 1.00 mg/dL   Glucose, Bld 815 (H) 70 - 99 mg/dL    Comment: Glucose reference range applies only to samples taken after fasting for at least 8 hours.   Calcium, Ion 1.10 (L) 1.15 - 1.40 mmol/L   TCO2 22 22 - 32 mmol/L   Hemoglobin 14.6 12.0 - 15.0 g/dL   HCT 56.9 63.9 - 53.9 %  Basic metabolic panel     Status: Abnormal   Collection Time: 02/07/24  4:30 AM  Result Value Ref Range   Sodium 143 135 - 145 mmol/L   Potassium 3.4 (L) 3.5 - 5.1 mmol/L   Chloride  105 98 - 111 mmol/L   CO2 26 22 - 32 mmol/L   Glucose, Bld 149 (H) 70 - 99 mg/dL    Comment: Glucose reference range applies only to samples taken after fasting for at least 8 hours.   BUN 15 8 - 23 mg/dL   Creatinine, Ser 9.46 0.44 - 1.00 mg/dL   Calcium 8.9 8.9 - 89.6 mg/dL   GFR, Estimated >39 >39 mL/min    Comment: (NOTE) Calculated using the CKD-EPI Creatinine Equation (2021)    Anion gap 12 5 - 15    Comment: Performed at [facility], [address] [place], [place] [address]  CBC with Differential     Status: None   Collection Time: 02/07/24  4:30 AM  Result Value Ref Range   WBC 9.6 4.0 - 10.5 K/uL   RBC 4.86 3.87 - 5.11 MIL/uL   Hemoglobin 14.6 12.0 - 15.0 g/dL   HCT 55.0 63.9 - 53.9 %   MCV 92.4  80.0 - 100.0 fL   MCH 30.0 26.0 - 34.0 pg   MCHC 32.5 30.0 - 36.0 g/dL   RDW 84.8 88.4 - 84.4 %   Platelets 207 150 - 400 K/uL   nRBC 0.0 0.0 - 0.2 %   Neutrophils Relative % 76 %   Neutro Abs 7.2 1.7 - 7.7 K/uL   Lymphocytes Relative 14 %   Lymphs Abs 1.4 0.7 - 4.0 K/uL   Monocytes Relative 9 %   Monocytes Absolute 0.9 0.1 - 1.0 K/uL   Eosinophils Relative 1 %   Eosinophils Absolute 0.1 0.0 - 0.5 K/uL   Basophils Relative 0 %   Basophils Absolute 0.0 0.0 - 0.1 K/uL   Immature Granulocytes 0 %   Abs Immature Granulocytes 0.04 0.00 - 0.07 K/uL    Comment: Performed at [facility], [address] [place], [place] [address]    DG Femur Min 2 Views Right Result Date: 02/07/2024 EXAM: 2 VIEW(S) XRAY OF THE RIGHT FEMUR 02/07/2024 02:24:00 AM COMPARISON: None available. CLINICAL HISTORY: Fall fall fall FINDINGS: BONES AND JOINTS: Acute impacted, transverse fracture of the distal right femoral metaphysis. Moderate suprapatellar joint effusion with lipohemarthrosis. SOFT TISSUES: Unremarkable. IMPRESSION: 1. Acute impacted transverse fracture of the distal right femoral metaphysis. 2. Moderate suprapatellar joint effusion with lipohemarthrosis. Electronically signed by: Pinkie Pebbles MD 02/07/2024 02:29 AM EST RP Workstation: [id number]   DG Femur Min 2 Views Left Result Date: 02/07/2024 EXAM: 2 Views XRAY OF THE LEFT Femur 02/07/2024 02:24:00 AM COMPARISON: None available. CLINICAL HISTORY: Fall FINDINGS: BONES AND JOINTS: Acute impacted, transverse fracture of the distal femoral metaphysis, mildly comminuted. Moderate suprapatellar joint effusion with lipohemarthrosis. SOFT TISSUES: Unremarkable. IMPRESSION: 1. Acute impacted, mildly comminuted transverse fracture of the distal femoral metaphysis. 2. Moderate suprapatellar joint effusion with lipohemarthrosis. Electronically signed by: Pinkie Pebbles MD 02/07/2024 02:27 AM EST RP Workstation: [id number]   DG Tibia/Fibula Left Result  Date: 02/07/2024 EXAM: 2 VIEW(S) Xray of the left tibia and fibula 02/07/2024 02:21:00 AM COMPARISON: None available. CLINICAL HISTORY: Fall FINDINGS: BONES AND JOINTS: No acute fracture. No malalignment. SOFT TISSUES: Unremarkable. IMPRESSION: 1. No significant abnormality. Electronically signed by: Pinkie Pebbles MD 02/07/2024 02:25 AM EST RP Workstation: [id number]   DG Tibia/Fibula Right Result Date: 02/07/2024 EXAM: 2 VIEW(S) Xray of the right tibia and fibula 02/07/2024 02:21:00 AM COMPARISON: None available. CLINICAL HISTORY: Fall FINDINGS: BONES AND JOINTS: No acute fracture. No malalignment. SOFT TISSUES: Unremarkable. IMPRESSION: 1. No significant abnormality. Electronically signed  by: Pinkie Pebbles MD 02/07/2024 02:24 AM EST RP Workstation: [id number]    Review of Systems  HENT:  Negative for ear discharge, ear pain, hearing loss and tinnitus.   Eyes:  Negative for photophobia and pain.  Respiratory:  Negative for cough and shortness of breath.   Cardiovascular:  Negative for chest pain.  Gastrointestinal:  Negative for abdominal pain, nausea and vomiting.  Genitourinary:  Negative for dysuria, flank pain, frequency and urgency.  Musculoskeletal:  Positive for arthralgias (Bilateral knees). Negative for back pain, myalgias and neck pain.  Neurological:  Negative for dizziness and headaches.  Hematological:  Does not bruise/bleed easily.  Psychiatric/Behavioral:  The patient is not nervous/anxious.    Blood pressure (!) 155/86, pulse 93, temperature 98.2 F (36.8 C), temperature source Temporal, resp. rate 15, SpO2 99%. Physical Exam Constitutional:      General: She is not in acute distress.    Appearance: She is well-developed. She is not diaphoretic.  HENT:     Head: Normocephalic and atraumatic.  Eyes:     General: No scleral icterus.       Right eye: No discharge.        Left eye: No discharge.     Conjunctiva/sclera: Conjunctivae normal.  Cardiovascular:     Rate  and Rhythm: Normal rate and regular rhythm.  Pulmonary:     Effort: Pulmonary effort is normal. No respiratory distress.  Musculoskeletal:     Cervical back: Normal range of motion.     Comments: BLE No traumatic wounds, ecchymosis, or rash  KI in place bilaterally  No ankle effusion  Sens DPN, SPN, TN intact  Motor EHL, ext, flex, evers 0-1/5  DP 2+, PT 1+, No significant edema  Skin:    General: Skin is warm and dry.  Neurological:     Mental Status: She is alert.  Psychiatric:        Mood and Affect: Mood normal.        Behavior: Behavior normal.     Assessment/Plan: Bilateral distal femur fxs -- Agree with non-operative management with KI's and NWB BLE. PT/OT to see if she is safe to d/c home with new restrictions. F/u with Dr. Jerri in 2-3 weeks.    Ozell DOROTHA Ned, PA-C Orthopedic Surgery [phone] 02/07/2024, 9:04 AM     [1] No Known Allergies

## 2024-02-07 NOTE — ED Notes (Signed)
 Ccmd called pt on monitor

## 2024-02-07 NOTE — Progress Notes (Deleted)
 1/21- PT/OT eval pending

## 2024-02-07 NOTE — H&P (Signed)
 " History and Physical    Marie Thomas FMW:[id number] DOB: 07/25/1945 DOA: 02/06/2024  PCP: Ileen Rosaline NOVAK, NP  Patient coming from: Home  Chief Complaint: Bilateral femur fractures  HPI: Marie Thomas is a 79 y.o. female with medical history significant of hypertension, poliomyelitis, paraplegia, wheelchair dependent, renal angiomyolipoma status post cryoablation in 2016 presenting to the ED due to concern for pain control.  Patient had a recent mechanical fall on 1/20.  She stood up from her wheelchair and family was helping her pull up her pants when she fell on her knees.  She was seen at another facility in [place], [place] and was diagnosed with bilateral distal femur fractures.  She was placed in bilateral knee immobilizers and reportedly told by the other facility that she was not a candidate for surgery due to not being ambulatory at baseline in the setting of history of poliomyelitis and paraplegia.  She was prescribed 5 mg of oxycodone  which did not help with pain.  Also taking Tylenol  and Advil .  Patient has no other complaints.  Denies fevers, cough, shortness of breath, nausea, vomiting, abdominal pain, diarrhea, or any urinary symptoms.  She does not take any blood thinners.  ED Course: Blood pressure 176/88 on arrival but subsequently improved with pain management.  Remainder of vital signs stable.  CBC unremarkable.  BMP notable for potassium 3.4.  X-ray of left femur showing acute impacted mildly comminuted transverse fracture of the distal femoral metaphysis and moderate suprapatellar joint effusion with lipohemarthrosis.  X-ray of right femur showing acute impacted transverse fracture of the distal right femoral metaphysis and moderate suprapatellar joint effusion with lipohemarthrosis.  X-rays of bilateral tibia/fibula negative for fractures.  Patient was given Tylenol  and oxycodone .  Orthopedics consulted (Dr. Jerri).   Review of Systems:  Review of Systems  All other  systems reviewed and are negative.   Past Medical History:  Diagnosis Date   H/O steroid therapy    Steroid use -last used 1 week ago   Headache    migraines-have occurred less since retired '99.   Hypertension    Neuromuscular disorder (HCC)    polio- no mobility lower extremities bilaterally   Pain at rest    abdominal pain, right side- intermittentdx. 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-wears braces bilaterally, no weight bearing at present, wheelchair bound   Polio     Past Surgical History:  Procedure Laterality Date   ABDOMINAL HYSTERECTOMY     CYSTOSCOPY W/ URETERAL STENT PLACEMENT Right 06/05/2014   Procedure: CYSTOSCOPY WITH STENT EXCHANGE , RETROGRADE PYLEOGRAM;  Surgeon: Garnette Shack, MD;  Location: [facility];  Service: Urology;  Laterality: Right;   CYSTOSCOPY WITH STENT PLACEMENT Right 04/28/2014   Procedure: CYSTOSCOPY WITH RIGHT RETROGRADE AND STENT PLACEMENT;  Surgeon: Garnette Shack, MD;  Location: [facility];  Service: Urology;  Laterality: Right;   IR GENERIC HISTORICAL  02/20/2014   IR RADIOLOGIST EVAL & MGMT 02/20/2014 Wilkie Lent, MD [facility]   IR GENERIC HISTORICAL  03/20/2014   IR RADIOLOGIST EVAL & MGMT 03/20/2014 Wilkie Lent, MD [facility]   LEG TENDON SURGERY     age 85 or 79 yrs old     reports that she has never smoked. She has never used smokeless tobacco. She reports current alcohol use. She reports that she does not use drugs.  Allergies[1]  Family History  Problem Relation Age of Onset   Heart disease  Brother    Prostate cancer Brother    Breast cancer Mother     Prior to Admission medications  Medication Sig Start Date End Date Taking? Authorizing Provider  acetaminophen  (TYLENOL ) 500 MG tablet Take 1,000 mg by mouth daily as needed for mild pain (pain score 1-3).   Yes [provider]  Cholecalciferol  (VITAMIN D) 2000 UNITS tablet Take 2,000 Units by mouth daily.    Yes [provider]  Cyanocobalamin (VITAMIN B12 PO) Take 1 tablet by mouth daily.   Yes [provider]  lisinopril  (ZESTRIL ) 20 MG tablet Take 1 tablet (20 mg total) by mouth daily. 10/05/23  Yes   oxyCODONE  (OXY IR/ROXICODONE ) 5 MG immediate release tablet Take 5 mg by mouth every 6 (six) hours as needed. 02/06/24  Yes [provider]  TURMERIC PO Take 1 capsule by mouth daily.   Yes [provider]    Physical Exam: Vitals:   02/06/24 2220 02/06/24 2228 02/07/24 0435  BP:  (!) 176/88 (!) 141/76  Pulse:  83 94  Resp:  17 18  Temp:  98.4 F (36.9 C) 97.9 F (36.6 C)  TempSrc:  Oral Oral  SpO2: 94% 91% 95%    Physical Exam Vitals reviewed.  Constitutional:      General: She is not in acute distress. HENT:     Head: Normocephalic and atraumatic.  Eyes:     Extraocular Movements: Extraocular movements intact.  Cardiovascular:     Rate and Rhythm: Normal rate and regular rhythm.     Heart sounds: Normal heart sounds.  Pulmonary:     Effort: Pulmonary effort is normal. No respiratory distress.     Breath sounds: Normal breath sounds.  Abdominal:     General: Bowel sounds are normal.     Palpations: Abdomen is soft.     Tenderness: There is no abdominal tenderness. There is no guarding.  Musculoskeletal:     Cervical back: Normal range of motion.     Comments: Bilateral knee immobilizers in place Dorsalis pedis pulse palpable and strong bilaterally  Skin:    General: Skin is warm and dry.  Neurological:     General: No focal deficit present.     Mental Status: She is alert and oriented to person, place, and time.     Labs on Admission: I have personally reviewed following labs and imaging studies  CBC: Recent Labs  Lab 02/07/24 0048 02/07/24 0430  WBC  --  9.6  NEUTROABS  --  7.2  HGB 14.6 14.6  HCT 43.0 44.9  MCV  --  92.4  PLT  --  207   Basic Metabolic Panel: Recent Labs  Lab 02/07/24 0048  NA 143  K 3.2*  CL 106   GLUCOSE 184*  BUN 15  CREATININE 0.60   GFR: CrCl cannot be calculated (Unknown ideal weight.). Liver Function Tests: No results for input(s): AST, ALT, ALKPHOS, BILITOT, PROT, ALBUMIN in the last 168 hours. No results for input(s): LIPASE, AMYLASE in the last 168 hours. No results for input(s): AMMONIA in the last 168 hours. Coagulation Profile: No results for input(s): INR, PROTIME in the last 168 hours. Cardiac Enzymes: No results for input(s): CKTOTAL, CKMB, CKMBINDEX, TROPONINI in the last 168 hours. BNP (last 3 results) No results for input(s): PROBNP in the last 8760 hours. HbA1C: No results for input(s): HGBA1C in the last 72 hours. CBG: No results for input(s): GLUCAP in the last 168 hours. Lipid Profile: No results for input(s):  CHOL, HDL, LDLCALC, TRIG, CHOLHDL, LDLDIRECT in the last 72 hours. Thyroid Function Tests: No results for input(s): TSH, T4TOTAL, FREET4, T3FREE, THYROIDAB in the last 72 hours. Anemia Panel: No results for input(s): VITAMINB12, FOLATE, FERRITIN, TIBC, IRON, RETICCTPCT in the last 72 hours. Urine analysis:    Component Value Date/Time   COLORURINE RED (A) 11/13/2013 0509   APPEARANCEUR TURBID (A) 11/13/2013 0509   LABSPEC 1.017 11/13/2013 0509   PHURINE 5.0 11/13/2013 0509   GLUCOSEU NEGATIVE 11/13/2013 0509   HGBUR LARGE (A) 11/13/2013 0509   BILIRUBINUR MODERATE (A) 11/13/2013 0509   KETONESUR 15 (A) 11/13/2013 0509   PROTEINUR 100 (A) 11/13/2013 0509   UROBILINOGEN 0.2 11/13/2013 0509   NITRITE NEGATIVE 11/13/2013 0509   LEUKOCYTESUR SMALL (A) 11/13/2013 0509    Radiological Exams on Admission: DG Femur Min 2 Views Right Result Date: 02/07/2024 EXAM: 2 VIEW(S) XRAY OF THE RIGHT FEMUR 02/07/2024 02:24:00 AM COMPARISON: None available. CLINICAL HISTORY: Fall fall fall FINDINGS: BONES AND JOINTS: Acute impacted, transverse fracture of the distal right femoral  metaphysis. Moderate suprapatellar joint effusion with lipohemarthrosis. SOFT TISSUES: Unremarkable. IMPRESSION: 1. Acute impacted transverse fracture of the distal right femoral metaphysis. 2. Moderate suprapatellar joint effusion with lipohemarthrosis. Electronically signed by: Pinkie Pebbles MD 02/07/2024 02:29 AM EST RP Workstation: [id number]   DG Femur Min 2 Views Left Result Date: 02/07/2024 EXAM: 2 Views XRAY OF THE LEFT Femur 02/07/2024 02:24:00 AM COMPARISON: None available. CLINICAL HISTORY: Fall FINDINGS: BONES AND JOINTS: Acute impacted, transverse fracture of the distal femoral metaphysis, mildly comminuted. Moderate suprapatellar joint effusion with lipohemarthrosis. SOFT TISSUES: Unremarkable. IMPRESSION: 1. Acute impacted, mildly comminuted transverse fracture of the distal femoral metaphysis. 2. Moderate suprapatellar joint effusion with lipohemarthrosis. Electronically signed by: Pinkie Pebbles MD 02/07/2024 02:27 AM EST RP Workstation: [id number]   DG Tibia/Fibula Left Result Date: 02/07/2024 EXAM: 2 VIEW(S) Xray of the left tibia and fibula 02/07/2024 02:21:00 AM COMPARISON: None available. CLINICAL HISTORY: Fall FINDINGS: BONES AND JOINTS: No acute fracture. No malalignment. SOFT TISSUES: Unremarkable. IMPRESSION: 1. No significant abnormality. Electronically signed by: Pinkie Pebbles MD 02/07/2024 02:25 AM EST RP Workstation: [id number]   DG Tibia/Fibula Right Result Date: 02/07/2024 EXAM: 2 VIEW(S) Xray of the right tibia and fibula 02/07/2024 02:21:00 AM COMPARISON: None available. CLINICAL HISTORY: Fall FINDINGS: BONES AND JOINTS: No acute fracture. No malalignment. SOFT TISSUES: Unremarkable. IMPRESSION: 1. No significant abnormality. Electronically signed by: Pinkie Pebbles MD 02/07/2024 02:24 AM EST RP Workstation: [id number]    Assessment and Plan  Bilateral distal femur fractures Bilateral moderate suprapatellar joint effusion with lipohemarthrosis Secondary to  a mechanical fall 2 days ago.  Reportedly seen at an outside facility at the time of her injury and was told that she was not a candidate for surgery due to not being ambulatory at baseline in the setting of history of poliomyelitis and paraplegia.  Orthopedics at [facility] consulted.  Keep n.p.o. for now except sips with meds, maintenance IV fluid, and continue pain management.  EKG ordered for preop evaluation in case orthopedics decides to pursue surgery.  Not on anticoagulation.  Hemoglobin stable, monitor H&H.  Mild hypokalemia Replace potassium and monitor labs.  Hypertension SBP now improved to 140s.  Continue pain management.  IV hydralazine  PRN SBP >160.   DVT prophylaxis: SCDs Code Status: Full Code (discussed with the patient) Level of care: Telemetry bed Admission status: It is my clinical opinion that referral for OBSERVATION is reasonable and necessary in this patient based on the above information provided.  The aforementioned taken together are felt to place the patient at high risk for further clinical deterioration. However, it is anticipated that the patient may be medically stable for discharge from the hospital within 24 to 48 hours.  Editha Ram MD [facility]  If 7PM-7AM, please contact night-coverage [email or web address]  02/07/2024, 5:02 AM       [1] No Known Allergies  "

## 2024-02-07 NOTE — Evaluation (Signed)
 Physical Therapy Evaluation Patient Details Name: Marie Thomas MRN: [id number] DOB: 04-26-1945 Today's Date: 02/07/2024  History of Present Illness  Pt is 79 year old presented to [facility] on  02/06/24 for bilateral femur fx's. Pt had been seen at another hospital and placed in knee immobilizers and dc'd home. Pain not controlled so came to ED.  PMH - polio, paraparesis, HTN.  Clinical Impression  Pt admitted with above diagnosis and presents to PT with functional limitations due to deficits listed below (See PT problem list). Pt needs skilled PT to maximize independence and safety. Prior to admission lives alone and is independent at w/c level and able to stand pivot with LE braces in place. Pt with polio at 9 months with resultant lifelong LE paraparesis. Pt has been independent throughout her life. Pt now with BLE fx and is in bil knee immobilizers and is NWB. Pt will have to transition to lateral scoot or sliding board transfer. Pain is better today and fairly controlled at rest but pt able to tolerate only minimal movement of her LE's due to pain when trying to come to EOB. Pt is excellent problem solver and expect she will make excellent progress as her pain improves allowing her to tolerate more movement. Patient will benefit from continued inpatient follow up therapy, <3 hours/day to help her eventually return home independently.         If plan is discharge home, recommend the following: Two people to help with walking and/or transfers;A lot of help with bathing/dressing/bathroom;Assist for transportation;Help with stairs or ramp for entrance;Assistance with cooking/housework   Can travel by private vehicle   No    Equipment Recommendations Hoyer lift  Recommendations for Other Services       Functional Status Assessment Patient has had a recent decline in their functional status and demonstrates the ability to make significant improvements in function in a reasonable and predictable  amount of time.     Precautions / Restrictions Precautions Precautions: Fall Required Braces or Orthoses: Knee Immobilizer - Right;Knee Immobilizer - Left Knee Immobilizer - Right: On at all times Knee Immobilizer - Left: On at all times Restrictions Weight Bearing Restrictions Per Provider Order: Yes RLE Weight Bearing Per Provider Order: Non weight bearing LLE Weight Bearing Per Provider Order: Non weight bearing      Mobility  Bed Mobility Overal bed mobility: Needs Assistance Bed Mobility:  (Supine to long sit)           General bed mobility comments: Min assist for supine to long sit. Unable to bring legs to EOB due to pain    Transfers                        Ambulation/Gait                  Stairs            Wheelchair Mobility     Tilt Bed    Modified Rankin (Stroke Patients Only)       Balance                                             Pertinent Vitals/Pain Pain Assessment Pain Assessment: Faces Faces Pain Scale: Hurts even more Pain Location: Bil knees and thighs Pain Descriptors / Indicators: Grimacing, Guarding Pain Intervention(s): Limited activity within  patient's tolerance, Monitored during session, Repositioned    Home Living Family/patient expects to be discharged to:: Private residence Living Arrangements: Alone Available Help at Discharge: Family;Available PRN/intermittently Type of Home: House Home Access: Ramped entrance       Home Layout: One level Home Equipment: Wheelchair - manual (sliding board) Additional Comments: Has BLE braces    Prior Function Prior Level of Function : Independent/Modified Independent             Mobility Comments: Modified independent at W/C level. Performed pivot transfer with leg braces on ADLs Comments: Modified I. Transfers in/out of sitting in tub on her own.     Extremity/Trunk Assessment   Upper Extremity Assessment Upper Extremity  Assessment: Defer to OT evaluation    Lower Extremity Assessment Lower Extremity Assessment: RLE deficits/detail;LLE deficits/detail RLE Deficits / Details: In knee immobilizer. Tolerated very little movement due to pain. LLE Deficits / Details: In knee immobilizer. Tolerated very little movement due to pain.       Communication   Communication Communication: No apparent difficulties    Cognition Arousal: Alert Behavior During Therapy: WFL for tasks assessed/performed   PT - Cognitive impairments: No apparent impairments                         Following commands: Intact       Cueing       General Comments      Exercises     Assessment/Plan    PT Assessment Patient needs continued PT services  PT Problem List Decreased strength;Decreased range of motion;Decreased balance;Decreased mobility;Decreased activity tolerance;Pain       PT Treatment Interventions DME instruction;Functional mobility training;Therapeutic activities;Therapeutic exercise;Balance training;Patient/family education;Wheelchair mobility training    PT Goals (Current goals can be found in the Care Plan section)  Acute Rehab PT Goals Patient Stated Goal: return home independently PT Goal Formulation: With patient Time For Goal Achievement: 02/21/24 Potential to Achieve Goals: Good    Frequency Min 2X/week     Co-evaluation               AM-PAC PT 6 Clicks Mobility  Outcome Measure Help needed turning from your back to your side while in a flat bed without using bedrails?: A Lot Help needed moving from lying on your back to sitting on the side of a flat bed without using bedrails?: Total Help needed moving to and from a bed to a chair (including a wheelchair)?: Total Help needed standing up from a chair using your arms (e.g., wheelchair or bedside chair)?: Total Help needed to walk in hospital room?: Total Help needed climbing 3-5 steps with a railing? : Total 6 Click  Score: 7    End of Session   Activity Tolerance: Patient limited by pain Patient left: in bed;with call bell/phone within reach   PT Visit Diagnosis: Other abnormalities of gait and mobility (R26.89);History of falling (Z91.81);Pain Pain - Right/Left:  (bilateral) Pain - part of body: Knee;Leg    Time: 8861-8771 PT Time Calculation (min) (ACUTE ONLY): 50 min   Charges:   PT Evaluation $PT Eval Moderate Complexity: 1 Mod PT Treatments $Therapeutic Activity: 23-37 mins PT General Charges $$ ACUTE PT VISIT: 1 Visit         [facility] PT Acute Rehabilitation Services Office [phone]   [facility] ORN [facility] 02/07/2024, 1:36 PM

## 2024-02-07 NOTE — Progress Notes (Signed)
 Same day note  Marie Thomas is a 79 y.o. female with medical history significant of hypertension, poliomyelitis, paraplegia, wheelchair dependent, renal angiomyolipoma status post cryoablation in 2016 presented hospital with mechanical fall on 02/06/2024 while standing up from the wheelchair.  Patient was diagnosed of having bilateral distal femur fracture and was put on knee immobilizers and was told by the other facility that she was not a good candidate for surgery due to being paraplegic with history of Polly myelitis.  She was prescribed oxycodone  but did not help so was brought into the hospital.  In the ED patient was hypotensive.  Potassium was low at 3.4.  X-ray of left femur showing acute impacted mildly comminuted transverse fracture of the distal femoral metaphysis and moderate suprapatellar joint effusion with lipohemarthrosis.  X-ray of right femur showing acute impacted transverse fracture of the distal right femoral metaphysis and moderate suprapatellar joint effusion with lipohemarthrosis.  X-rays of bilateral tibia/fibula negative for fractures.  Patient was given Tylenol  and oxycodone .  Orthopedics consulted (Dr. Jerri) and patient was out of hospital for further evaluation and treatment. Patient seen and examined at bedside.  Patient was admitted to the hospital for ongoing pain.  At the time of my evaluation, patient complains of lower extremity pain.  Physical examination reveals bilateral lower extremity immobilizers.  Laboratory data and imaging was reviewed  Assessment and Plan.  Bilateral distal femur fractures Bilateral moderate suprapatellar joint effusion with lipohemarthrosis Status post mechanical fall 2 days back.  Orthopedics has been consulted at this time.   Not on anticoagulation.  Continue pain control.  Follow orthopedic recommendations   Mild hypokalemia Replenish potassium.   Essential hypertension Continue pain management.  As needed hydralazine  for  SBP more than 160.  No Charge  Signed,  Vernal Anselm Alstrom, MD [facility]

## 2024-02-07 NOTE — Hospital Course (Signed)
 [name]

## 2024-02-08 DIAGNOSIS — S7290XA Unspecified fracture of unspecified femur, initial encounter for closed fracture: Secondary | ICD-10-CM | POA: Diagnosis not present

## 2024-02-08 LAB — BASIC METABOLIC PANEL WITH GFR
Anion gap: 11 (ref 5–15)
BUN: 20 mg/dL (ref 8–23)
CO2: 24 mmol/L (ref 22–32)
Calcium: 9.1 mg/dL (ref 8.9–10.3)
Chloride: 107 mmol/L (ref 98–111)
Creatinine, Ser: 0.79 mg/dL (ref 0.44–1.00)
GFR, Estimated: >60 mL/min
Glucose, Bld: 121 mg/dL — ABNORMAL HIGH (ref 70–99)
Potassium: 3.7 mmol/L (ref 3.5–5.1)
Sodium: 142 mmol/L (ref 135–145)

## 2024-02-08 LAB — CBC
HCT: 42.2 % (ref 36.0–46.0)
Hemoglobin: 13.5 g/dL (ref 12.0–15.0)
MCH: 29.8 pg (ref 26.0–34.0)
MCHC: 32 g/dL (ref 30.0–36.0)
MCV: 93.2 fL (ref 80.0–100.0)
Platelets: 201 K/uL (ref 150–400)
RBC: 4.53 MIL/uL (ref 3.87–5.11)
RDW: 15.5 % (ref 11.5–15.5)
WBC: 8.6 K/uL (ref 4.0–10.5)
nRBC: 0 % (ref 0.0–0.2)

## 2024-02-08 NOTE — Progress Notes (Signed)
 " PROGRESS NOTE  Marie Thomas FMW:[id number] DOB: 1945-08-15 DOA: 02/06/2024 PCP: Ileen Rosaline NOVAK, NP   LOS: 0 days   Brief narrative:  Marie Thomas is a 79 y.o. female with medical history significant of hypertension, poliomyelitis, paraplegia, wheelchair dependent, renal angiomyolipoma status post cryoablation in 2016 presented hospital with mechanical fall on 02/06/2024 while standing up from the wheelchair.  Patient was diagnosed of having bilateral distal femur fracture and was put on knee immobilizers and was told by the other facility that she was not a good candidate for surgery due to being paraplegic with history of Polly myelitis.  She was prescribed oxycodone  but did not help so was brought into the hospital.  In the ED patient was hypotensive.  Potassium was low at 3.4.  X-ray of left femur showing acute impacted mildly comminuted transverse fracture of the distal femoral metaphysis and moderate suprapatellar joint effusion with lipohemarthrosis.  X-ray of right femur showing acute impacted transverse fracture of the distal right femoral metaphysis and moderate suprapatellar joint effusion with lipohemarthrosis.  X-rays of bilateral tibia/fibula negative for fractures.  Patient was given Tylenol  and oxycodone .  Orthopedics consulted (Dr. Jerri) and patient was out of hospital for further evaluation and treatment.     Assessment/Plan: Principal Problem:   Femur fracture (HCC)  Bilateral distal femur fractures Bilateral moderate suprapatellar joint effusion with lipohemarthrosis Status post mechanical fall 2 days back.  Orthopedics was consulted and at this time plan is immobilization and no surgical intervention.  No weightbearing advised.  PT OT has been consulted.   Mild hypokalemia Improved after replacement.  Latest potassium of 3.7   Essential hypertension Blood pressure seems to be stable at this time.  History of paraplegia from polio on wheelchair as outpatient.   Was able to transfer on her own at home.  Currently with bilateral fractures seems like she will be having difficulty.  PT OT has been consulted.  DVT prophylaxis: SCDs Start: 02/07/24 0531   Disposition: Likely to skilled nursing facility.  Patient wishes to be considered for home if possible and wishes to have PT OT discussed with the patient's niece for what she would need at home.  Status is: Observation The patient will require care spanning > 2 midnights and should be moved to inpatient because: Possible rehab placement    Code Status:     Code Status: Full Code  Family Communication: None at bedside  Consultants: Orthopedics  Procedures: Bilateral lower extremity immobilizer  Anti-infectives:  None  Anti-infectives (From admission, onward)    None        Subjective: Today, patient was seen and examined at bedside.  Patient states that she wishes to go home if possible.  Denies overt pain at this time.  Denies any shortness of breath dyspnea chest pain palpitation.  Objective: Vitals:   02/08/24 0820 02/08/24 0832  BP: 121/62 116/61  Pulse: 90 85  Resp: 18 18  Temp: 98.2 F (36.8 C) 98.2 F (36.8 C)  SpO2: 90% 90%   No intake or output data in the 24 hours ending 02/08/24 1200 There were no vitals filed for this visit. There is no height or weight on file to calculate BMI.   Physical Exam:  GENERAL: Patient is alert awake and oriented. Not in obvious distress. HENT: No scleral pallor or icterus. Pupils equally reactive to light. Oral mucosa is moist NECK: is supple, no gross swelling noted. CHEST: Diminished breath sounds bilaterally. CVS: S1 and S2 heard, no  murmur. Regular rate and rhythm.  ABDOMEN: Soft, non-tender, bowel sounds are present. EXTREMITIES: No edema. CNS: Cranial nerves are intact.  Bilateral lower extremity on immobilizer, history of paraplegia from polio. SKIN: warm and dry without rashes.  Data Review: I have personally  reviewed the following laboratory data and studies,  CBC: Recent Labs  Lab 02/07/24 0048 02/07/24 0430 02/08/24 0453  WBC  --  9.6 8.6  NEUTROABS  --  7.2  --   HGB 14.6 14.6 13.5  HCT 43.0 44.9 42.2  MCV  --  92.4 93.2  PLT  --  207 201   Basic Metabolic Panel: Recent Labs  Lab 02/07/24 0048 02/07/24 0430 02/08/24 0453  NA 143 143 142  K 3.2* 3.4* 3.7  CL 106 105 107  CO2  --  26 24  GLUCOSE 184* 149* 121*  BUN 15 15 20   CREATININE 0.60 0.53 0.79  CALCIUM  --  8.9 9.1   Liver Function Tests: No results for input(s): AST, ALT, ALKPHOS, BILITOT, PROT, ALBUMIN in the last 168 hours. No results for input(s): LIPASE, AMYLASE in the last 168 hours. No results for input(s): AMMONIA in the last 168 hours. Cardiac Enzymes: No results for input(s): CKTOTAL, CKMB, CKMBINDEX, TROPONINI in the last 168 hours. BNP (last 3 results) No results for input(s): BNP in the last 8760 hours.  ProBNP (last 3 results) No results for input(s): PROBNP in the last 8760 hours.  CBG: No results for input(s): GLUCAP in the last 168 hours. No results found for this or any previous visit (from the past 240 hours).   Studies: DG Femur Min 2 Views Right Result Date: 02/07/2024 EXAM: 2 VIEW(S) XRAY OF THE RIGHT FEMUR 02/07/2024 02:24:00 AM COMPARISON: None available. CLINICAL HISTORY: Fall fall fall FINDINGS: BONES AND JOINTS: Acute impacted, transverse fracture of the distal right femoral metaphysis. Moderate suprapatellar joint effusion with lipohemarthrosis. SOFT TISSUES: Unremarkable. IMPRESSION: 1. Acute impacted transverse fracture of the distal right femoral metaphysis. 2. Moderate suprapatellar joint effusion with lipohemarthrosis. Electronically signed by: Pinkie Pebbles MD 02/07/2024 02:29 AM EST RP Workstation: [id number]   DG Femur Min 2 Views Left Result Date: 02/07/2024 EXAM: 2 Views XRAY OF THE LEFT Femur 02/07/2024 02:24:00 AM COMPARISON: None  available. CLINICAL HISTORY: Fall FINDINGS: BONES AND JOINTS: Acute impacted, transverse fracture of the distal femoral metaphysis, mildly comminuted. Moderate suprapatellar joint effusion with lipohemarthrosis. SOFT TISSUES: Unremarkable. IMPRESSION: 1. Acute impacted, mildly comminuted transverse fracture of the distal femoral metaphysis. 2. Moderate suprapatellar joint effusion with lipohemarthrosis. Electronically signed by: Pinkie Pebbles MD 02/07/2024 02:27 AM EST RP Workstation: [id number]   DG Tibia/Fibula Left Result Date: 02/07/2024 EXAM: 2 VIEW(S) Xray of the left tibia and fibula 02/07/2024 02:21:00 AM COMPARISON: None available. CLINICAL HISTORY: Fall FINDINGS: BONES AND JOINTS: No acute fracture. No malalignment. SOFT TISSUES: Unremarkable. IMPRESSION: 1. No significant abnormality. Electronically signed by: Pinkie Pebbles MD 02/07/2024 02:25 AM EST RP Workstation: [id number]   DG Tibia/Fibula Right Result Date: 02/07/2024 EXAM: 2 VIEW(S) Xray of the right tibia and fibula 02/07/2024 02:21:00 AM COMPARISON: None available. CLINICAL HISTORY: Fall FINDINGS: BONES AND JOINTS: No acute fracture. No malalignment. SOFT TISSUES: Unremarkable. IMPRESSION: 1. No significant abnormality. Electronically signed by: Pinkie Pebbles MD 02/07/2024 02:24 AM EST RP Workstation: [id number]      Vernal Alstrom, MD  [facility] 02/08/2024  If 7PM-7AM, please contact night-coverage             "

## 2024-02-08 NOTE — Evaluation (Signed)
 Occupational Therapy Evaluation Patient Details Name: Marie Thomas MRN: [id number] DOB: 10-19-1945 Today's Date: 02/08/2024   History of Present Illness   Pt is 79 year old presented to [facility] on  02/06/24 for bilateral femur fx's. Pt had been seen at another hospital and placed in knee immobilizers and dc'd home. Pain not controlled so came to ED.  PMH - polio, paraparesis, HTN.     Clinical Impressions Pt is from home where she was modified independent. She was functioning at [facility] level from contracting Polio when she was 20 months old) doing her own transfers and donning leg braces. Tub transfers for bathing mod I typically. Today she presents with BLE in knee immobilizers, pain limiting - but motivated and does better when movement is slow and planned. OT opened up KI for RN to perform skin check at max/total A, Pt is able to perform long sitting from supine in bed at min A/CGA and max A +2 for simulated AP transfer as she scooted up the flat bed - heavy use of bed pad. Pt able to complete UB ADL with set up at bed level, and max to total A for LB ADL at this time. Recommend continued OT acutely as well as post-acute at rehab of <3 hours daily.  Pt will need drop arm BSC, OT next session will continue to work on AP transfers or begin lateral scoot transfers. In addition to LB ADL with compensatory strategies/AE     If plan is discharge home, recommend the following:   Two people to help with walking and/or transfers;A lot of help with bathing/dressing/bathroom;Help with stairs or ramp for entrance;Assist for transportation     Functional Status Assessment   Patient has had a recent decline in their functional status and demonstrates the ability to make significant improvements in function in a reasonable and predictable amount of time.     Equipment Recommendations   None recommended by OT (defer to next venue)     Recommendations for Other Services   PT consult      Precautions/Restrictions   Precautions Precautions: Fall Recall of Precautions/Restrictions: Intact Required Braces or Orthoses: Knee Immobilizer - Right;Knee Immobilizer - Left Knee Immobilizer - Right: On at all times Knee Immobilizer - Left: On at all times Restrictions Weight Bearing Restrictions Per Provider Order: Yes RLE Weight Bearing Per Provider Order: Non weight bearing LLE Weight Bearing Per Provider Order: Non weight bearing     Mobility Bed Mobility Overal bed mobility: Needs Assistance Bed Mobility:  (Supine to long sit)           General bed mobility comments: Min assist for supine to long sit. Unable to bring legs to EOB due to pain    Transfers Overall transfer level: Needs assistance Equipment used: 2 person hand held assist Transfers: Bed to chair/wheelchair/BSC         Anterior-Posterior transfers: Max assist, +2 physical assistance, +2 safety/equipment   General transfer comment: heavy use of bed pad to assist, Pt requires SLOW transfer and - simulated through HOB flat and scooting up the bed      Balance                                           ADL either performed or assessed with clinical judgement   ADL Overall ADL's : Needs assistance/impaired Eating/Feeding: Modified independent;Sitting   Grooming:  Set up;Wash/dry face;Wash/dry hands;Sitting   Upper Body Bathing: Moderate assistance Upper Body Bathing Details (indicate cue type and reason): for back Lower Body Bathing: Maximal assistance;Bed level   Upper Body Dressing : Set up;Sitting   Lower Body Dressing: Total assistance;Bed level   Toilet Transfer: Maximal assistance;+2 for physical assistance;+2 for safety/equipment;Anterior/posterior Toilet Transfer Details (indicate cue type and reason): simulated through moving up the bed Toileting- Clothing Manipulation and Hygiene: Maximal assistance;Bed level;+2 for physical assistance Toileting - Clothing  Manipulation Details (indicate cue type and reason): rolling at bed level for peri care     Functional mobility during ADLs: Maximal assistance;+2 for physical assistance;+2 for safety/equipment (AP or lateral transfers with bed pad) General ADL Comments: decreased access to LB for ADL, decreased ability to transfer, pain impacting function     Vision Patient Visual Report: No change from baseline       Perception         Praxis         Pertinent Vitals/Pain Pain Assessment Pain Assessment: Faces Faces Pain Scale: Hurts even more Pain Location: Bil knees and thighs Pain Descriptors / Indicators: Grimacing, Guarding Pain Intervention(s): Monitored during session, Repositioned     Extremity/Trunk Assessment Upper Extremity Assessment Upper Extremity Assessment: Overall WFL for tasks assessed   Lower Extremity Assessment Lower Extremity Assessment: Defer to PT evaluation RLE Deficits / Details: In knee immobilizer. Tolerated very little movement due to pain. LLE Deficits / Details: In knee immobilizer. Tolerated very little movement due to pain.       Communication Communication Communication: No apparent difficulties Factors Affecting Communication: Other (comment) (verbose)   Cognition Arousal: Alert Behavior During Therapy: WFL for tasks assessed/performed Cognition: Cognition impaired     Awareness: Online awareness impaired Memory impairment (select all impairments): Short-term memory (needed repeated information frequently)   Executive functioning impairment (select all impairments): Reasoning (mild) OT - Cognition Comments: Pt is pleasant/cooperative and appreciates therapy. She needs education and time for ideas to settle. She is particular and likes to direct care. Teamwork approach works best. During session she did need education/information repeated several times, and mild levels of anxiety present surrounding pain and movement                  Following commands: Intact       Cueing  General Comments   Cueing Techniques: Verbal cues  VSS on RA   Exercises     Shoulder Instructions      Home Living Family/patient expects to be discharged to:: Private residence Living Arrangements: Alone Available Help at Discharge: Family;Available PRN/intermittently Type of Home: House Home Access: Ramped entrance     Home Layout: One level     Bathroom Shower/Tub: Chief Strategy Officer: Standard Bathroom Accessibility: Yes   Home Equipment: Wheelchair - manual (sliding board)   Additional Comments: Has BLE braces      Prior Functioning/Environment Prior Level of Function : Independent/Modified Independent             Mobility Comments: Modified independent at W/C level. Performed pivot transfer with leg braces on ADLs Comments: Modified I. Transfers in/out of sitting in tub on her own.    OT Problem List: Decreased activity tolerance;Decreased range of motion;Impaired balance (sitting and/or standing);Decreased knowledge of use of DME or AE;Decreased knowledge of precautions;Pain   OT Treatment/Interventions: Self-care/ADL training;DME and/or AE instruction;Therapeutic activities;Patient/family education;Balance training;Therapeutic exercise      OT Goals(Current goals can be found in the care plan  section)   Acute Rehab OT Goals Patient Stated Goal: go to SNF and return to independent OT Goal Formulation: With patient Time For Goal Achievement: 02/22/24 Potential to Achieve Goals: Good ADL Goals Pt Will Perform Grooming: with modified independence;sitting;bed level Pt Will Perform Upper Body Dressing: with set-up;sitting;bed level Pt Will Perform Lower Body Dressing: with mod assist;with adaptive equipment;sitting/lateral leans Pt Will Transfer to Toilet: with mod assist;anterior/posterior transfer;bedside commode Pt Will Perform Toileting - Clothing Manipulation and hygiene: with  supervision;sitting/lateral leans Additional ADL Goal #1: Pt will perform bed mobility at min A prior to engaging in ADL   OT Frequency:  Min 2X/week    Co-evaluation              AM-PAC OT 6 Clicks Daily Activity     Outcome Measure Help from another person eating meals?: None Help from another person taking care of personal grooming?: None Help from another person toileting, which includes using toliet, bedpan, or urinal?: Total Help from another person bathing (including washing, rinsing, drying)?: A Lot Help from another person to put on and taking off regular upper body clothing?: A Little Help from another person to put on and taking off regular lower body clothing?: Total 6 Click Score: 15   End of Session Equipment Utilized During Treatment: Right knee immobilizer;Left knee immobilizer Nurse Communication: Mobility status;Precautions;Patient requests pain meds;Weight bearing status  Activity Tolerance: Patient tolerated treatment well (imapcted by pain) Patient left: in bed;with call bell/phone within reach  OT Visit Diagnosis: Other abnormalities of gait and mobility (R26.89);Pain;Muscle weakness (generalized) (M62.81) Pain - Right/Left: Left (Bilateral) Pain - part of body: Leg                Time: 8947-8845 OT Time Calculation (min): 62 min Charges:  OT General Charges $OT Visit: 1 Visit OT Evaluation $OT Eval Moderate Complexity: 1 Mod OT Treatments $Self Care/Home Management : 23-37 mins $Therapeutic Activity: 8-22 mins  Leita DEL OTR/L Acute Rehabilitation Services Office: [phone]  Leita JINNY Odea 02/08/2024, 1:57 PM

## 2024-02-08 NOTE — TOC Progression Note (Signed)
 Transition of Care [facility]) - Progression Note    Patient Details  Name: Marie Thomas MRN: [id number] Date of Birth: [birth year]  Transition of Care [facility]) CM/SW Contact  Bridget Cordella Simmonds, LCSW Phone Number: 02/08/2024, 4:02 PM  Clinical Narrative:   PASSR received: [phone] [id number].    Expected Discharge Plan: Skilled Nursing Facility Barriers to Discharge: Continued Medical Work up               Expected Discharge Plan and Services   Discharge Planning Services: CM Consult   Living arrangements for the past 2 months: Single Family Home                                       Social Drivers of Health (SDOH) Interventions SDOH Screenings   Food Insecurity: No Food Insecurity (02/07/2024)  Housing: Low Risk (02/07/2024)  Transportation Needs: No Transportation Needs (02/07/2024)  Utilities: Not At Risk (02/07/2024)  Social Connections: Socially Isolated (02/07/2024)  Tobacco Use: Low Risk (02/06/2024)    Readmission Risk Interventions     No data to display

## 2024-02-08 NOTE — NC FL2 (Addendum)
 " [place]  MEDICAID [id number] LEVEL OF CARE FORM     IDENTIFICATION  Patient Name: Marie Thomas Birthdate: 05-23-1945 Sex: female Admission Date (Current Location): 02/06/2024  [facility] and [place] Number:  Producer, Television/film/video and Address:         Provider Number:    Attending Physician Name and Address:  Sonjia Held, MD  Relative Name and Phone Number:       Current Level of Care: Hospital Recommended Level of Care: Skilled Nursing Facility Prior Approval Number:    Date Approved/Denied:   PASRR Number:  pending  Discharge Plan: SNF    Current Diagnoses: Patient Active Problem List   Diagnosis Date Noted   Femur fracture (HCC) 02/07/2024   Hypokalemia    Absolute anemia    Dehydration 04/23/2014   Leukocytosis 04/23/2014   Sepsis (HCC) 04/23/2014   Anemia 04/23/2014   History of poliomyelitis 04/23/2014   Paraplegia (HCC) 04/23/2014   Right renal mass    Urinoma/possible perinephric Abscess    Renal angiomyolipoma    Renal mass, right    Renal benign neoplasm 02/07/2014   Renal cell cancer (HCC) 12/02/2013    Orientation RESPIRATION BLADDER Height & Weight     Self, Time, Situation, Place  Normal External catheter Weight:   Height:     BEHAVIORAL SYMPTOMS/MOOD NEUROLOGICAL BOWEL NUTRITION STATUS      Continent Diet (REFER TO D/C SUMMARY)  AMBULATORY STATUS COMMUNICATION OF NEEDS Skin   Extensive Assist Verbally Normal                       Personal Care Assistance Level of Assistance  Bathing, Feeding, Dressing Bathing Assistance: Maximum assistance Feeding assistance: Independent Dressing Assistance: Maximum assistance     Functional Limitations Info  Sight, Hearing, Speech Sight Info: Adequate Hearing Info: Adequate Speech Info: Adequate    SPECIAL CARE FACTORS FREQUENCY  PT (By licensed PT), OT (By licensed OT)     PT Frequency: 5X/week, evaluate and treat OT Frequency: 5X/week, evaluate and treat             Contractures Contractures Info: Not present    Additional Factors Info  Code Status, Allergies Code Status Info: Full Code Allergies Info: No known allergies           Current Medications (02/08/2024):  This is the current hospital active medication list Current Facility-Administered Medications  Medication Dose Route Frequency Provider Last Rate Last Admin   acetaminophen  (TYLENOL ) tablet 650 mg  650 mg Oral Q6H PRN Rathore, Vasundhra, MD   650 mg at 02/07/24 1356   Or   acetaminophen  (TYLENOL ) suppository 650 mg  650 mg Rectal Q6H PRN Alfornia Madison, MD       acetaminophen  (TYLENOL ) tablet 1,000 mg  1,000 mg Oral Q8H Pokhrel, Laxman, MD   1,000 mg at 02/08/24 9371   ibuprofen  (ADVIL ) tablet 400 mg  400 mg Oral Q6H PRN Pokhrel, Laxman, MD   400 mg at 02/07/24 1822   lisinopril  (ZESTRIL ) tablet 20 mg  20 mg Oral Daily Pokhrel, Laxman, MD   20 mg at 02/08/24 9157   naloxone  (NARCAN ) injection 0.4 mg  0.4 mg Intravenous PRN Rathore, Vasundhra, MD       oxyCODONE  (Oxy IR/ROXICODONE ) immediate release tablet 10 mg  10 mg Oral Q6H PRN Pokhrel, Laxman, MD   10 mg at 02/08/24 9371   pantoprazole  (PROTONIX ) EC tablet 40 mg  40 mg Oral Daily Pokhrel, Laxman, MD  40 mg at 02/08/24 9157     Discharge Medications: Please see discharge summary for a list of discharge medications.  Relevant Imaging Results:  Relevant Lab Results:   Additional Information ss#[id number]  Rosalva Jon Bloch, RN     "

## 2024-02-08 NOTE — TOC Initial Note (Signed)
 Transition of Care [facility]) - Initial/Assessment Note    Patient Details  Name: Marie Thomas MRN: [id number] Date of Birth: November 16, 1945  Transition of Care [facility]) CM/SW Contact:    Rosalva Jon Bloch, RN Phone Number: 02/08/2024, 11:39 AM  Clinical Narrative:                 -bilateral distal femur fxs (non-operative management)     RNCM received consult for possible SNF placement at time of discharge. RNCM spoke with patient regarding PT recommendation of SNF placement at time of discharge. Patient reports she lives alone and is currently unable to care for self independently at home given current physical needs and fall risk. Patient expressed understanding of PT recommendation and is agreeable to SNF placement at time of discharge. Patient without preference for  SNF . RNCM discussed insurance authorization process and provided Medicare SNF ratings list. Patient expressed being hopeful for rehab and to feel better soon. No further questions reported at this time. RNCM to continue to follow and assist with discharge planning needs.   Expected Discharge Plan: Skilled Nursing Facility Barriers to Discharge: Continued Medical Work up   Patient Goals and CMS Choice     Choice offered to / list presented to : Patient      Expected Discharge Plan and Services   Discharge Planning Services: CM Consult   Living arrangements for the past 2 months: Single Family Home                                      Prior Living Arrangements/Services Living arrangements for the past 2 months: Single Family Home Lives with:: Self Patient language and need for interpreter reviewed:: Yes Do you feel safe going back to the place where you live?: Yes      Need for Family Participation in Patient Care: Yes (Comment) Care giver support system in place?: Yes (comment)   Criminal Activity/Legal Involvement Pertinent to Current Situation/Hospitalization: No - Comment as needed  Activities of  Daily Living   ADL Screening (condition at time of admission) Independently performs ADLs?: No Does the patient have a NEW difficulty with bathing/dressing/toileting/self-feeding that is expected to last >3 days?: Yes (Initiates electronic notice to provider for possible OT consult) Does the patient have a NEW difficulty with getting in/out of bed, walking, or climbing stairs that is expected to last >3 days?: Yes (Initiates electronic notice to provider for possible PT consult) Does the patient have a NEW difficulty with communication that is expected to last >3 days?: No Is the patient deaf or have difficulty hearing?: No Does the patient have difficulty seeing, even when wearing glasses/contacts?: No Does the patient have difficulty concentrating, remembering, or making decisions?: No  Permission Sought/Granted      Share Information with NAME: Toya Collet  Niece  [phone]           Emotional Assessment       Orientation: : Oriented to Self, Oriented to Place, Oriented to  Time, Oriented to Situation Alcohol / Substance Use: Not Applicable Psych Involvement: No (comment)  Admission diagnosis:  Femur fracture (HCC) [S72.90XA] Closed fracture of distal end of femur, unspecified fracture morphology, unspecified laterality, initial encounter (HCC) [S72.409A] Patient Active Problem List   Diagnosis Date Noted   Femur fracture (HCC) 02/07/2024   Hypokalemia    Absolute anemia    Dehydration 04/23/2014   Leukocytosis 04/23/2014  Sepsis (HCC) 04/23/2014   Anemia 04/23/2014   History of poliomyelitis 04/23/2014   Paraplegia (HCC) 04/23/2014   Right renal mass    Urinoma/possible perinephric Abscess    Renal angiomyolipoma    Renal mass, right    Renal benign neoplasm 02/07/2014   Renal cell cancer (HCC) 12/02/2013   PCP:  Ileen Rosaline NOVAK, NP Pharmacy:   [facility] [phone] [place], [place] - [address] AT [facility] [address] [place] [address] Phone: [phone] Fax: [phone]  [facility] [address] [place] [address] Phone: [phone] Fax: [phone]  [place] [facility] [address] [place] [address] Phone: [phone] Fax: [phone]     Social Drivers of Health (SDOH) Social History: SDOH Screenings   Food Insecurity: No Food Insecurity (02/07/2024)  Housing: Low Risk (02/07/2024)  Transportation Needs: No Transportation Needs (02/07/2024)  Utilities: Not At Risk (02/07/2024)  Social Connections: Socially Isolated (02/07/2024)  Tobacco Use: Low Risk (02/06/2024)   SDOH Interventions:     Readmission Risk Interventions     No data to display

## 2024-02-08 NOTE — Plan of Care (Signed)
  Problem: Nutrition: Goal: Adequate nutrition will be maintained Outcome: Progressing   Problem: Coping: Goal: Level of anxiety will decrease Outcome: Progressing   Problem: Pain Managment: Goal: General experience of comfort will improve and/or be controlled Outcome: Progressing   Problem: Safety: Goal: Ability to remain free from injury will improve Outcome: Progressing

## 2024-02-08 NOTE — Plan of Care (Signed)
" °  Problem: Clinical Measurements: Goal: Respiratory complications will improve Outcome: Progressing   Problem: Clinical Measurements: Goal: Cardiovascular complication will be avoided Outcome: Progressing   Problem: Activity: Goal: Risk for activity intolerance will decrease Outcome: Progressing   Problem: Nutrition: Goal: Adequate nutrition will be maintained Outcome: Progressing   Problem: Pain Managment: Goal: General experience of comfort will improve and/or be controlled Outcome: Progressing   Problem: Safety: Goal: Ability to remain free from injury will improve Outcome: Progressing   "

## 2024-02-08 NOTE — Care Management Obs Status (Signed)
 MEDICARE OBSERVATION STATUS NOTIFICATION   Patient Details  Name: Marie Thomas MRN: [id number] Date of Birth: 1945-01-21   Medicare Observation Status Notification Given:  Yes    Jennie Laneta Dragon 02/08/2024, 3:39 PM

## 2024-02-09 DIAGNOSIS — S7290XA Unspecified fracture of unspecified femur, initial encounter for closed fracture: Secondary | ICD-10-CM | POA: Diagnosis not present

## 2024-02-09 MED ORDER — POLYETHYLENE GLYCOL 3350 17 G PO PACK: 17.0000 g | PACK | Freq: Every day | ORAL | Status: DC | PRN

## 2024-02-09 MED ORDER — POLYETHYLENE GLYCOL 3350 17 G PO PACK
17.0000 g | PACK | Freq: Every day | ORAL | Status: DC
  Administered 2024-02-09 – 2024-02-14 (×4): 17 g via ORAL
  Filled 2024-02-09 (×6): qty 1

## 2024-02-09 MED ORDER — BISACODYL 5 MG PO TBEC
10.0000 mg | DELAYED_RELEASE_TABLET | Freq: Once | ORAL | Status: AC
  Administered 2024-02-09: 10 mg via ORAL
  Filled 2024-02-09: qty 2

## 2024-02-09 MED ORDER — DOCUSATE SODIUM 100 MG PO CAPS
100.0000 mg | ORAL_CAPSULE | Freq: Two times a day (BID) | ORAL | Status: DC
  Administered 2024-02-09 – 2024-02-14 (×11): 100 mg via ORAL
  Filled 2024-02-09 (×12): qty 1

## 2024-02-09 NOTE — Plan of Care (Signed)

## 2024-02-09 NOTE — Progress Notes (Addendum)
 " PROGRESS NOTE  SHERRIA RIEMANN FMW:[id number] DOB: [birth year] DOA: 02/06/2024 PCP: Ileen Rosaline NOVAK, NP   LOS: 0 days   Brief narrative:  Marie Thomas is a 79 y.o. female with medical history significant of hypertension, poliomyelitis, paraplegia, wheelchair dependent, renal angiomyolipoma status post cryoablation in 2016 presented hospital with mechanical fall on 02/06/2024 while standing up from the wheelchair.  Patient was diagnosed of having bilateral distal femur fracture and was put on knee immobilizers and was told by the other facility that she was not a good candidate for surgery due to being paraplegic with history of Polly myelitis.  She was prescribed oxycodone  but did not help so was brought into the hospital.  In the ED patient was hypotensive.  Potassium was low at 3.4.  X-ray of left femur showing acute impacted mildly comminuted transverse fracture of the distal femoral metaphysis and moderate suprapatellar joint effusion with lipohemarthrosis.  X-ray of right femur showing acute impacted transverse fracture of the distal right femoral metaphysis and moderate suprapatellar joint effusion with lipohemarthrosis.  X-rays of bilateral tibia/fibula negative for fractures.  Patient was given Tylenol  and oxycodone .  Orthopedics consulted (Dr. Jerri) and patient was out of hospital for further evaluation and treatment.    Assessment/Plan: Principal Problem:   Femur fracture (HCC)  Bilateral distal femur fractures Bilateral moderate suprapatellar joint effusion with lipohemarthrosis Status post mechanical fall 2 days back.  Orthopedics was consulted and at this time plan is immobilization and no surgical intervention.  No weightbearing advised.  PT OT recommended skilled nursing facility placement at this time.   Mild hypokalemia Resolved.   Essential hypertension Blood pressure seems to be stable at this time.  Latest blood pressure 131/88.  Has been resumed on lisinopril  from  home  History of paraplegia from polio on wheelchair as outpatient.  Was able to transfer on her own at home.  Currently with bilateral fractures PT OT recommends skilled nursing facility placement. DVT prophylaxis: SCDs Start: 02/07/24 0531   Disposition: Skilled nursing facility as per PT recommendation.  Medically stable for disposition  Status is: Observation The patient will require care spanning > 2 midnights and should be moved to inpatient because: Need for skilled nursing facility placement    Code Status:     Code Status: Full Code  Family Communication: None at bedside  Consultants: Orthopedics  Procedures: Bilateral lower extremity immobilizer  Anti-infectives:  None  Anti-infectives (From admission, onward)    None        Subjective: Today, patient was seen and examined at bedside.  Patient denies interval complaints.  Denies any shortness of breath dyspnea chest pain fever or chills  Objective: Vitals:   02/09/24 0458 02/09/24 0747  BP: 135/67 131/88  Pulse: 92 77  Resp: 17 16  Temp: 98.8 F (37.1 C) 98.6 F (37 C)  SpO2: 97% 94%    Intake/Output Summary (Last 24 hours) at 02/09/2024 1102 Last data filed at 02/09/2024 1000 Gross per 24 hour  Intake 360 ml  Output --  Net 360 ml   There were no vitals filed for this visit. There is no height or weight on file to calculate BMI.   Physical Exam:  General:  Average built, not in obvious distress HENT:   No scleral pallor or icterus noted. Oral mucosa is moist.  Chest: Diminished breath sounds bilaterally CVS: S1 &S2 heard. No murmur.  Regular rate and rhythm. Abdomen: Soft, nontender, nondistended.  Bowel sounds are heard.   Extremities:  No cyanosis, clubbing or edema.  Peripheral pulses are palpable. Psych: Alert, awake and oriented, normal mood CNS:  No cranial nerve deficits.  Bilateral lower extremity on immobilizer.  Paraplegia Skin: Warm and dry.  No rashes noted.   Data Review: I  have personally reviewed the following laboratory data and studies,  CBC: Recent Labs  Lab 02/07/24 0048 02/07/24 0430 02/08/24 0453  WBC  --  9.6 8.6  NEUTROABS  --  7.2  --   HGB 14.6 14.6 13.5  HCT 43.0 44.9 42.2  MCV  --  92.4 93.2  PLT  --  207 201   Basic Metabolic Panel: Recent Labs  Lab 02/07/24 0048 02/07/24 0430 02/08/24 0453  NA 143 143 142  K 3.2* 3.4* 3.7  CL 106 105 107  CO2  --  26 24  GLUCOSE 184* 149* 121*  BUN 15 15 20   CREATININE 0.60 0.53 0.79  CALCIUM  --  8.9 9.1   Liver Function Tests: No results for input(s): AST, ALT, ALKPHOS, BILITOT, PROT, ALBUMIN in the last 168 hours. No results for input(s): LIPASE, AMYLASE in the last 168 hours. No results for input(s): AMMONIA in the last 168 hours. Cardiac Enzymes: No results for input(s): CKTOTAL, CKMB, CKMBINDEX, TROPONINI in the last 168 hours. BNP (last 3 results) No results for input(s): BNP in the last 8760 hours.  ProBNP (last 3 results) No results for input(s): PROBNP in the last 8760 hours.  CBG: No results for input(s): GLUCAP in the last 168 hours. No results found for this or any previous visit (from the past 240 hours).   Studies: No results found.     Shauntea Lok, MD  [facility] 02/09/2024  If 7PM-7AM, please contact night-coverage             "

## 2024-02-09 NOTE — TOC Progression Note (Addendum)
 Transition of Care [facility]) - Progression Note    Patient Details  Name: Marie Thomas MRN: [id number] Date of Birth: 1945-03-31  Transition of Care [facility]) CM/SW Contact  Bridget Cordella Simmonds, LCSW Phone Number: 02/09/2024, 1:06 PM  Clinical Narrative: Bed offers provided to pt, 3 sisters in room, one sister on speakphone.  [facility] both full.  They will review.     [facility] cannot offer.  1430: CSW spoke with pt, family in room.  They are requesting response from [facility] and [facility].  CSW reached out to both facilities: [facility] is full.   SNF auth and PTAR request submitted to [facility] with facility choice pending.   Message from [facility]: full, unsure if beds available next week either.    Expected Discharge Plan: Skilled Nursing Facility Barriers to Discharge: Continued Medical Work up               Expected Discharge Plan and Services   Discharge Planning Services: CM Consult   Living arrangements for the past 2 months: Single Family Home                                       Social Drivers of Health (SDOH) Interventions SDOH Screenings   Food Insecurity: No Food Insecurity (02/07/2024)  Housing: Low Risk (02/07/2024)  Transportation Needs: No Transportation Needs (02/07/2024)  Utilities: Not At Risk (02/07/2024)  Social Connections: Socially Isolated (02/07/2024)  Tobacco Use: Low Risk (02/06/2024)    Readmission Risk Interventions     No data to display

## 2024-02-09 NOTE — Progress Notes (Signed)
 Physical Therapy Treatment Patient Details Name: Marie Thomas MRN: [id number] DOB: 1945-10-13 Today's Date: 02/09/2024   History of Present Illness Pt is 79 year old presented to [facility] on  02/06/24 for bilateral femur fx's. Pt had been seen at another hospital and placed in knee immobilizers and dc'd home. Pain not controlled so came to ED.  PMH - polio, paraparesis, HTN.    PT Comments  Mobility continues to be severely limited by pain. Pt attempting to perform lateral and posterior scoot transfers but having increased difficulty completing secondary to significant pain in bilateral lower extremities. Pt given thera-band and educated on UE strengthening exercises to perform when not with therapy with pt verbalizing and then demonstrating understanding. PT will continue to treat pt while she is admitted. Patient will benefit from continued inpatient follow up therapy, <3 hours/day.     If plan is discharge home, recommend the following: Two people to help with walking and/or transfers;Two people to help with bathing/dressing/bathroom;Assistance with cooking/housework;Assist for transportation;Help with stairs or ramp for entrance   Can travel by private vehicle     No  Equipment Recommendations  Hoyer lift    Recommendations for Other Services       Precautions / Restrictions Precautions Precautions: Fall Recall of Precautions/Restrictions: Intact Required Braces or Orthoses: Knee Immobilizer - Right;Knee Immobilizer - Left Knee Immobilizer - Right: On at all times Knee Immobilizer - Left: On at all times Restrictions Weight Bearing Restrictions Per Provider Order: Yes RLE Weight Bearing Per Provider Order: Non weight bearing LLE Weight Bearing Per Provider Order: Non weight bearing     Mobility  Bed Mobility Overal bed mobility: Needs Assistance Bed Mobility:  (supine to long sit)           General bed mobility comments: Pt attempting lateral scoots, forming fists with  hands to push up from surface with but having increased difficulty due to lack of firmness of bed. Pt then performed posterior reciprocal scoots towards [facility] with increased time and effort. Unable to progress OOB due to significant pain in BLEs, and lack of firmness of bed.     Transfers                        Ambulation/Gait                   Stairs             Wheelchair Mobility     Tilt Bed    Modified Rankin (Stroke Patients Only)       Balance                                            Communication Communication Communication: No apparent difficulties Factors Affecting Communication: Other (comment) (verbose)  Cognition Arousal: Alert Behavior During Therapy: WFL for tasks assessed/performed   PT - Cognitive impairments: No apparent impairments                         Following commands: Intact      Cueing Cueing Techniques: Verbal cues, Gestural cues  Exercises      General Comments General comments (skin integrity, edema, etc.): VSS on RA. Of note, pt developing sacral wound.       Pertinent Vitals/Pain Pain Assessment Pain Assessment: Faces Faces Pain Scale: Hurts  even more Pain Location: Bil knees and thighs Pain Descriptors / Indicators: Grimacing, Guarding Pain Intervention(s): Limited activity within patient's tolerance, Monitored during session    Home Living                          Prior Function            PT Goals (current goals can now be found in the care plan section) Acute Rehab PT Goals Patient Stated Goal: return home independently PT Goal Formulation: With patient Time For Goal Achievement: 02/21/24 Potential to Achieve Goals: Good Progress towards PT goals: Not progressing toward goals - comment    Frequency    Min 2X/week      PT Plan      Co-evaluation              AM-PAC PT 6 Clicks Mobility   Outcome Measure  Help needed turning from  your back to your side while in a flat bed without using bedrails?: A Lot Help needed moving from lying on your back to sitting on the side of a flat bed without using bedrails?: Total Help needed moving to and from a bed to a chair (including a wheelchair)?: Total Help needed standing up from a chair using your arms (e.g., wheelchair or bedside chair)?: Total Help needed to walk in hospital room?: Total Help needed climbing 3-5 steps with a railing? : Total 6 Click Score: 7    End of Session   Activity Tolerance: Patient limited by pain Patient left: in bed;with call bell/phone within reach Nurse Communication: Mobility status PT Visit Diagnosis: Other abnormalities of gait and mobility (R26.89);History of falling (Z91.81);Pain Pain - part of body: Knee;Leg     Time: 8550-8462 PT Time Calculation (min) (ACUTE ONLY): 48 min  Charges:    $Therapeutic Activity: 38-52 mins PT General Charges $$ ACUTE PT VISIT: 1 Visit                     Leontine Hilt DPT Acute Rehab Services [phone] Prefer contact via chat    Leontine KATHEE Hilt 02/09/2024, 4:35 PM

## 2024-02-10 DIAGNOSIS — S7290XA Unspecified fracture of unspecified femur, initial encounter for closed fracture: Secondary | ICD-10-CM | POA: Diagnosis not present

## 2024-02-10 MED ORDER — BISACODYL 5 MG PO TBEC
10.0000 mg | DELAYED_RELEASE_TABLET | Freq: Once | ORAL | Status: AC
  Administered 2024-02-10: 10 mg via ORAL
  Filled 2024-02-10: qty 2

## 2024-02-10 MED ORDER — CYCLOBENZAPRINE HCL 5 MG PO TABS
5.0000 mg | ORAL_TABLET | Freq: Three times a day (TID) | ORAL | Status: DC | PRN
  Administered 2024-02-10 – 2024-02-11 (×4): 5 mg via ORAL
  Filled 2024-02-10 (×5): qty 1

## 2024-02-10 NOTE — Progress Notes (Signed)
 " PROGRESS NOTE  [name] FMW:[id number] DOB: 1945/08/03 DOA: 02/06/2024 PCP: Ileen Rosaline NOVAK, NP   LOS: 0 days   Brief narrative:  Marie Thomas is a 79 y.o. female with medical history significant of hypertension, poliomyelitis, paraplegia, wheelchair dependent, renal angiomyolipoma status post cryoablation in 2016 presented hospital with mechanical fall on 02/06/2024 while standing up from the wheelchair.  Patient was diagnosed of having bilateral distal femur fracture and was put on knee immobilizers and was told by the other facility that she was not a good candidate for surgery due to being paraplegic with history of Polly myelitis.  She was prescribed oxycodone  but did not help so was brought into the hospital.  In the ED, patient was hypotensive.  Potassium was low at 3.4.  X-ray of left femur showing acute impacted mildly comminuted transverse fracture of the distal femoral metaphysis and moderate suprapatellar joint effusion with lipohemarthrosis.  X-ray of right femur showing acute impacted transverse fracture of the distal right femoral metaphysis and moderate suprapatellar joint effusion with lipohemarthrosis.  X-rays of bilateral tibia/fibula negative for fractures.  Patient was given Tylenol  and oxycodone .  Orthopedics consulted (Dr. Jerri) and patient was out of hospital for further evaluation and treatment.    Assessment/Plan: Principal Problem:   Femur fracture (HCC)  Bilateral distal femur fractures Bilateral moderate suprapatellar joint effusion with lipohemarthrosis Status post mechanical fall 2 days back.  Orthopedics was consulted and at this time plan is immobilization and no surgical intervention.  No weightbearing advised.  PT/ OT recommended skilled nursing facility placement at this time.   Mild hypokalemia Resolved.  Potassium was 3.7   Essential hypertension Blood pressure seems to be stable at this time.  Latest blood pressure of 145/75, has been  resumed on lisinopril  from home  History of paraplegia from polio on wheelchair as outpatient.  Was able to transfer on her own at home.  Currently with bilateral fractures. PT OT recommends skilled nursing facility placement.  DVT prophylaxis: SCDs Start: 02/07/24 0531   Disposition: Skilled nursing facility as per PT recommendation.  Medically stable for disposition  Status is: Observation   Code Status:     Code Status: Full Code  Family Communication: None at bedside  Consultants: Orthopedics  Procedures: Bilateral lower extremity immobilizer  Anti-infectives:  None  Anti-infectives (From admission, onward)    None       Subjective: Today, patient was seen and examined at bedside.  Patient denies any shortness of breath dyspnea chest pain fever or chills.  Objective: Vitals:   02/10/24 0515 02/10/24 0729  BP: 120/83 (!) 145/75  Pulse: 88 96  Resp: 18   Temp: 98.7 F (37.1 C) 98.6 F (37 C)  SpO2: 95% 94%   No intake or output data in the 24 hours ending 02/10/24 1028  There were no vitals filed for this visit. There is no height or weight on file to calculate BMI.   Physical Exam:  General:  Average built, not in obvious distress HENT:   No scleral pallor or icterus noted. Oral mucosa is moist.  Chest: Diminished breath sounds bilaterally CVS: S1 &S2 heard. No murmur.  Regular rate and rhythm. Abdomen: Soft, nontender, nondistended.  Bowel sounds are heard.   Extremities: No cyanosis, clubbing or edema.  Peripheral pulses are palpable. Psych: Alert, awake and oriented, normal mood CNS:  No cranial nerve deficits.  Bilateral lower extremity on immobilizer.  Paraplegia Skin: Warm and dry.  No rashes noted.  Data Review: I have personally reviewed the following laboratory data and studies,  CBC: Recent Labs  Lab 02/07/24 0048 02/07/24 0430 02/08/24 0453  WBC  --  9.6 8.6  NEUTROABS  --  7.2  --   HGB 14.6 14.6 13.5  HCT 43.0 44.9 42.2   MCV  --  92.4 93.2  PLT  --  207 201   Basic Metabolic Panel: Recent Labs  Lab 02/07/24 0048 02/07/24 0430 02/08/24 0453  NA 143 143 142  K 3.2* 3.4* 3.7  CL 106 105 107  CO2  --  26 24  GLUCOSE 184* 149* 121*  BUN 15 15 20   CREATININE 0.60 0.53 0.79  CALCIUM  --  8.9 9.1   Liver Function Tests: No results for input(s): AST, ALT, ALKPHOS, BILITOT, PROT, ALBUMIN in the last 168 hours. No results for input(s): LIPASE, AMYLASE in the last 168 hours. No results for input(s): AMMONIA in the last 168 hours. Cardiac Enzymes: No results for input(s): CKTOTAL, CKMB, CKMBINDEX, TROPONINI in the last 168 hours. BNP (last 3 results) No results for input(s): BNP in the last 8760 hours.  ProBNP (last 3 results) No results for input(s): PROBNP in the last 8760 hours.  CBG: No results for input(s): GLUCAP in the last 168 hours. No results found for this or any previous visit (from the past 240 hours).   Studies: No results found.     Odesser Tourangeau, MD  [facility] 02/10/2024  If 7PM-7AM, please contact night-coverage             "

## 2024-02-10 NOTE — Plan of Care (Signed)
   Problem: Education: Goal: Knowledge of General Education information will improve Description Including pain rating scale, medication(s)/side effects and non-pharmacologic comfort measures Outcome: Progressing   Problem: Health Behavior/Discharge Planning: Goal: Ability to manage health-related needs will improve Outcome: Progressing

## 2024-02-10 NOTE — TOC Progression Note (Addendum)
 Transition of Care [facility]) - Progression Note    Patient Details  Name: Marie Thomas MRN: [id number] Date of Birth: 06/16/1945  Transition of Care [facility]) CM/SW Contact  Carrigan Delafuente, [place], [place] Phone Number: 02/10/2024, 4:01 PM  Clinical Narrative:    Phone call to [facility] as well as Sidra at [place] confirming no beds available. Per [employer] there may be potential availability Monday or Tuesday. Contacted patient's sister, who requested that patient's niece be contacted, Marie Thomas [phone] as well as Marie Thomas [phone] contacted while at patient's bedside to discuss status of bed offers. Patient would like to remain local and has a preference of [facility] if at all possible.   HTA authorization received for 7 days, starting 02/09/24 auth# for [id number]   auth# for ambulance [id number]  Haide Klinker, LCSW Transition of Care    Expected Discharge Plan: Skilled Nursing Facility Barriers to Discharge: Continued Medical Work up               Expected Discharge Plan and Services   Discharge Planning Services: CM Consult   Living arrangements for the past 2 months: Single Family Home                                       Social Drivers of Health (SDOH) Interventions SDOH Screenings   Food Insecurity: No Food Insecurity (02/07/2024)  Housing: Low Risk (02/07/2024)  Transportation Needs: No Transportation Needs (02/07/2024)  Utilities: Not At Risk (02/07/2024)  Social Connections: Socially Isolated (02/07/2024)  Tobacco Use: Low Risk (02/06/2024)    Readmission Risk Interventions     No data to display

## 2024-02-11 DIAGNOSIS — S7290XA Unspecified fracture of unspecified femur, initial encounter for closed fracture: Secondary | ICD-10-CM | POA: Diagnosis not present

## 2024-02-11 MED ORDER — OXYCODONE HCL 5 MG PO TABS
5.0000 mg | ORAL_TABLET | ORAL | Status: DC | PRN
  Administered 2024-02-11 – 2024-02-12 (×2): 5 mg via ORAL
  Filled 2024-02-11 (×3): qty 1

## 2024-02-11 NOTE — Care Management (Signed)
 SDOH resources added to AVS

## 2024-02-11 NOTE — Progress Notes (Signed)
 " PROGRESS NOTE  Marie Thomas FMW:[id number] DOB: 1945/10/14 DOA: 02/06/2024 PCP: Ileen Rosaline NOVAK, NP   LOS: 0 days   Brief narrative:  Marie Thomas is a 79 y.o. female with medical history significant of hypertension, poliomyelitis, paraplegia, wheelchair dependent, renal angiomyolipoma status post cryoablation in 2016 presented hospital with mechanical fall on 02/06/2024 while standing up from the wheelchair.  Patient was diagnosed of having bilateral distal femur fracture and was put on knee immobilizers and was told by the other facility that she was not a good candidate for surgery due to being paraplegic with history of Poliomyelitis.  She was prescribed oxycodone  but did not help so was brought into the hospital.  In the ED, patient was hypotensive.  Potassium was low at 3.4.  X-ray of left femur showing acute impacted mildly comminuted transverse fracture of the distal femoral metaphysis and moderate suprapatellar joint effusion with lipohemarthrosis.  X-ray of right femur showing acute impacted transverse fracture of the distal right femoral metaphysis and moderate suprapatellar joint effusion with lipohemarthrosis.  X-rays of bilateral tibia/fibula negative for fractures.  Patient was given Tylenol  and oxycodone .  Orthopedics consulted (Dr. Jerri) and patient was out of hospital for further evaluation and treatment.    Assessment/Plan: Principal Problem:   Femur fracture (HCC)  Bilateral distal femur fractures Bilateral moderate suprapatellar joint effusion with lipohemarthrosis Status post mechanical fall 2 days back.  Orthopedics was consulted and at this time plan is immobilization and no surgical intervention.  No weightbearing advised.  PT/ OT recommended skilled nursing facility placement at this time.  Will adjust oxycodone  every 4 hourly instead of 6 hourly and decrease the dose to 5 mg instead of 10 mg.  Will see how the patient does.  Continue with bowel regimen.    Mild hypokalemia Resolved.  Potassium was 3.7.  Check lab in AM.   Essential hypertension Blood pressure seems to be stable at this time.  Latest blood pressure of 145/75, has been resumed on lisinopril  from home  History of paraplegia from polio on wheelchair as outpatient.  Was able to transfer on her own at home.  Currently with bilateral fractures. PT OT recommends skilled nursing facility placement.  DVT prophylaxis: SCDs Start: 02/07/24 0531   Disposition: Skilled nursing facility as per PT recommendation.  Medically stable for disposition  Status is: Observation   Code Status:     Code Status: Full Code  Family Communication: None at bedside  Consultants: Orthopedics  Procedures: Bilateral lower extremity immobilizer  Anti-infectives:  None  Anti-infectives (From admission, onward)    None       Subjective: Today, patient was seen and examined at bedside.  Patient denies any nausea vomiting fever chills or rigor.  Has had bowel movements.  Patient stated that her pain medication is not lasting enough.  Objective: Vitals:   02/10/24 2038 02/11/24 0632  BP: (!) 142/65 (!) 154/64  Pulse: 83 67  Resp: 18 16  Temp: 99.6 F (37.6 C) 97.7 F (36.5 C)  SpO2: 93% 98%    Intake/Output Summary (Last 24 hours) at 02/11/2024 1055 Last data filed at 02/11/2024 9070 Gross per 24 hour  Intake 360 ml  Output 550 ml  Net -190 ml    There were no vitals filed for this visit. There is no height or weight on file to calculate BMI.   Physical Exam:  General:  Average built, not in obvious distress HENT:   No scleral pallor or icterus noted. Oral  mucosa is moist.  Chest: Diminished breath sounds bilaterally CVS: S1 &S2 heard. No murmur.  Regular rate and rhythm. Abdomen: Soft, nontender, nondistended.  Bowel sounds are heard.   Extremities: Paraplegia.  On immobilizer. Psych: Alert, awake and oriented, normal mood CNS:  No cranial nerve deficits.  Bilateral  lower extremity on immobilizer.  Paraplegia Skin: Warm and dry.  No rashes noted.   Data Review: I have personally reviewed the following laboratory data and studies,  CBC: Recent Labs  Lab 02/07/24 0048 02/07/24 0430 02/08/24 0453  WBC  --  9.6 8.6  NEUTROABS  --  7.2  --   HGB 14.6 14.6 13.5  HCT 43.0 44.9 42.2  MCV  --  92.4 93.2  PLT  --  207 201   Basic Metabolic Panel: Recent Labs  Lab 02/07/24 0048 02/07/24 0430 02/08/24 0453  NA 143 143 142  K 3.2* 3.4* 3.7  CL 106 105 107  CO2  --  26 24  GLUCOSE 184* 149* 121*  BUN 15 15 20   CREATININE 0.60 0.53 0.79  CALCIUM  --  8.9 9.1   Liver Function Tests: No results for input(s): AST, ALT, ALKPHOS, BILITOT, PROT, ALBUMIN in the last 168 hours. No results for input(s): LIPASE, AMYLASE in the last 168 hours. No results for input(s): AMMONIA in the last 168 hours. Cardiac Enzymes: No results for input(s): CKTOTAL, CKMB, CKMBINDEX, TROPONINI in the last 168 hours. BNP (last 3 results) No results for input(s): BNP in the last 8760 hours.  ProBNP (last 3 results) No results for input(s): PROBNP in the last 8760 hours.  CBG: No results for input(s): GLUCAP in the last 168 hours. No results found for this or any previous visit (from the past 240 hours).   Studies: No results found.     Heyward Douthit, MD  [facility] 02/11/2024  If 7PM-7AM, please contact night-coverage             "

## 2024-02-11 NOTE — Discharge Instructions (Signed)

## 2024-02-11 NOTE — Plan of Care (Signed)

## 2024-02-11 NOTE — Plan of Care (Signed)
   Problem: Education: Goal: Knowledge of General Education information will improve Description: Including pain rating scale, medication(s)/side effects and non-pharmacologic comfort measures Outcome: Progressing   Problem: Clinical Measurements: Goal: Ability to maintain clinical measurements within normal limits will improve Outcome: Progressing

## 2024-02-12 DIAGNOSIS — S7290XA Unspecified fracture of unspecified femur, initial encounter for closed fracture: Secondary | ICD-10-CM | POA: Diagnosis not present

## 2024-02-12 MED ORDER — CYCLOBENZAPRINE HCL 5 MG PO TABS
5.0000 mg | ORAL_TABLET | Freq: Three times a day (TID) | ORAL | Status: DC
  Administered 2024-02-12 – 2024-02-14 (×7): 5 mg via ORAL
  Filled 2024-02-12 (×6): qty 1

## 2024-02-12 MED ORDER — OXYCODONE HCL 5 MG PO TABS
10.0000 mg | ORAL_TABLET | ORAL | Status: DC | PRN
  Administered 2024-02-12 – 2024-02-14 (×6): 10 mg via ORAL
  Filled 2024-02-12 (×7): qty 2

## 2024-02-12 NOTE — Progress Notes (Addendum)
 Physical Therapy Treatment Patient Details Name: Marie Thomas MRN: [id number] DOB: June 30, 1945 Today's Date: 02/12/2024   History of Present Illness Pt is 79 year old presented [facility] on  02/06/24 for bilateral femur fx. Pt had been seen at another hospital and placed in knee immobilizers and dc'd home. Pain not controlled so came to ED. PMH - polio, paraparesis, HTN.    PT Comments  Pt received in recliner RN requesting PTA assist with return transfer per pt request due to lack of +2 assist on unit this date. Pt agreeable to additional therapy session, pt A&O and able to self-direct mobility after discussion on plan. Pt limited due to LLE severe c/o pain (premedication from AM session likely less effective since it has been >3 hours since she took her meds) as well as anxiety due to prospect of pain with mobility. Care team including ortho PA and attending MD notified pt LLE pain is most severe and pt may benefit from further imaging/follow-up of LLE in case it is less stable, given her intolerance to rolling even with extra care taken and while scooting in bed. Pt needing up to +2 maxA for anterior seated scooting from bed to chair due to pain and up to +2 modA for lateral and posterior seated scooting in deflated air bed with BLE elevated on pillows for comfort/ease of movements. Air bed reinflated once pt back in position and ice pack given for L hip/LE for comfort. Patient will benefit from continued inpatient follow up therapy, <3 hours/day    If plan is discharge home, recommend the following: Two people to help with walking and/or transfers;Two people to help with bathing/dressing/bathroom;Assistance with cooking/housework;Assist for transportation;Help with stairs or ramp for entrance   Can travel by private vehicle     No  Equipment Recommendations  Hoyer lift (consider drop arm BSC and slide board if she does not have one)    Recommendations for Other Services       Precautions  / Restrictions Precautions Precautions: Fall Recall of Precautions/Restrictions: Intact Precaution/Restrictions Comments: pain severely limiting her, LLE>RLE Required Braces or Orthoses: Knee Immobilizer - Right;Knee Immobilizer - Left Knee Immobilizer - Right: On at all times Knee Immobilizer - Left: On at all times Restrictions Weight Bearing Restrictions Per Provider Order: Yes RLE Weight Bearing Per Provider Order: Non weight bearing LLE Weight Bearing Per Provider Order: Non weight bearing     Mobility  Bed Mobility Overal bed mobility: Needs Assistance Bed Mobility: Supine to Sit, Sit to Supine Rolling: Max assist, +2 for safety/equipment, Used rails   Supine to sit: Supervision, Used rails, HOB elevated Sit to supine: Supervision, Used rails, HOB elevated   General bed mobility comments: pt using bed rails to raise/lower trunk once back in bed    Transfers Overall transfer level: Needs assistance Equipment used:  (bed pad assist) Transfers: Bed to chair/wheelchair/BSC         Anterior-Posterior transfers: Max assist, +2 physical assistance, +2 safety/equipment  Lateral/Scoot Transfers: Mod assist, +2 physical assistance (bed pad assist) General transfer comment: +2 modA mostly for anterior scooting with pt using BUE on chair arm rests to lift hips, but LLE pain limiting her, so a couple reps needed +2 maxA when scooting from chair on to deflated air bed surface. Pt screaming with increased assist, partially due to anxiety but also due to pain, with heavy guarding at LLE with all movements. Care team including attending and ortho PA notified of pt uncontrolled pain in LLE  with attempts at mobilizing her, esp with scooting/rolling with nursing and therapy staff. Pt closer to modA +2 for lateral scooting and posterior scooting once back on deflated air bed surface (BLE with pillows under them for comfort/elevation and ease of scooting).    Ambulation/Gait                General Gait Details: NT; pt NWB BLE   Stairs             Wheelchair Mobility     Tilt Bed    Modified Rankin (Stroke Patients Only)       Balance Overall balance assessment: Needs assistance Sitting-balance support: Bilateral upper extremity supported, Single extremity supported Sitting balance-Leahy Scale: Fair Sitting balance - Comments: with UE support, fair seated balance in long sitting. unsupported appears poor for dynamic tasks Postural control: Posterior lean     Standing balance comment: NT; NWB BLE                            Communication Communication Communication: No apparent difficulties Factors Affecting Communication: Other (comment) (verbose)  Cognition Arousal: Alert Behavior During Therapy: WFL for tasks assessed/performed, Anxious   PT - Cognitive impairments: No apparent impairments                       PT - Cognition Comments: Pt particular due to pain and having difficulty altering her typical movement techniques due to anxiety and pain with rolling. Ortho PA and Attending/RN notified of pt LLE>RLE pain in case they can make sure her LLE injury has not worsened in some way or if it may need fixation of some kind. Following commands: Intact      Cueing Cueing Techniques: Verbal cues, Gestural cues, Tactile cues  Exercises      General Comments General comments (skin integrity, edema, etc.): purewick repositioned once back in supine for pt comfort, ice pack to L hip for comfort      Pertinent Vitals/Pain Pain Assessment Pain Assessment: 0-10 Pain Score: 10-Worst pain ever Pain Location: Bil knees and thighs despite premedication and KI adjustment, L>R Pain Descriptors / Indicators: Grimacing, Guarding, Moaning, Sharp, Sore (screaming) Pain Intervention(s): Monitored during session, Limited activity within patient's tolerance, Premedicated before session, Repositioned, Ice applied    Home Living                           Prior Function            PT Goals (current goals can now be found in the care plan section) Acute Rehab PT Goals Patient Stated Goal: return home independently PT Goal Formulation: With patient Time For Goal Achievement: 02/21/24 Progress towards PT goals: Progressing toward goals (slowly)    Frequency    Min 2X/week      PT Plan      Co-evaluation              AM-PAC PT 6 Clicks Mobility   Outcome Measure  Help needed turning from your back to your side while in a flat bed without using bedrails?: Total Help needed moving from lying on your back to sitting on the side of a flat bed without using bedrails?: A Little Help needed moving to and from a bed to a chair (including a wheelchair)?: A Lot Help needed standing up from a chair using your arms (e.g., wheelchair or bedside chair)?:  Total Help needed to walk in hospital room?: Total Help needed climbing 3-5 steps with a railing? : Total 6 Click Score: 9    End of Session Equipment Utilized During Treatment: Left knee immobilizer;Right knee immobilizer;Other (comment) (bed pad assist) Activity Tolerance: Patient limited by pain;Other (comment) (anxiety surrounding pain/fear also a limitation) Patient left: with call bell/phone within reach;Other (comment);in bed;with bed alarm set (air bed reinflated, BLE elevated) Nurse Communication: Mobility status;Patient requests pain meds;Precautions;Other (comment);Need for lift equipment (needs at least +2 to safely/efficiently scoot A/P, MD/PA notified her LLE pain is not controlled with pain meds, may need further imaging to assess?) PT Visit Diagnosis: Other abnormalities of gait and mobility (R26.89);History of falling (Z91.81);Pain Pain - part of body: Knee;Leg;Hip (BLE)     Time: 8691-8665 PT Time Calculation (min) (ACUTE ONLY): 26 min  Charges:    $Therapeutic Activity: 8-22 mins PT General Charges $$ ACUTE PT VISIT: 1 Visit                      Chestina Komatsu P., PTA Acute Rehabilitation Services Secure Chat Preferred 9a-5:30pm Office: [phone]    [facility] 02/12/2024, 1:51 PM

## 2024-02-12 NOTE — Plan of Care (Signed)

## 2024-02-12 NOTE — TOC Progression Note (Addendum)
 Transition of Care [facility]) - Progression Note    Patient Details  Name: Marie Thomas MRN: [id number] Date of Birth: [birth year]  [facility] CM/SW Contact  Bridget Cordella Simmonds, LCSW Phone Number: 02/12/2024, 9:59 AM  Clinical Narrative:   [facility] unable to offer bed.    CSW spoke with pt niece [name], discussed offers.  She was interested in [facility], [facility], [facility], [facility].  [facility] and [facility] cannot accept.  Message from [facility]: they are full. Message Nikki/[facility]: will have bed Wednesday, can offer.    CSW spoke with [name], she will accept offer at [facility].  Auth already approved by HTA.   Expected Discharge Plan: Skilled Nursing Facility Barriers to Discharge: Continued Medical Work up               Expected Discharge Plan and Services   Discharge Planning Services: CM Consult   Living arrangements for the past 2 months: Single Family Home                                       Social Drivers of Health (SDOH) Interventions SDOH Screenings   Food Insecurity: No Food Insecurity (02/07/2024)  Housing: Low Risk (02/07/2024)  Transportation Needs: No Transportation Needs (02/07/2024)  Utilities: Not At Risk (02/07/2024)  Social Connections: Socially Isolated (02/07/2024)  Tobacco Use: Low Risk (02/06/2024)    Readmission Risk Interventions     No data to display

## 2024-02-12 NOTE — Progress Notes (Signed)
 Physical Therapy Treatment Patient Details Name: Marie Thomas MRN: [id number] DOB: [birth year] Today's Date: 02/12/2024   History of Present Illness Pt is 79 year old presented to [facility] on  02/06/24 for bilateral femur fx. Pt had been seen at another hospital and placed in knee immobilizers and dc'd home. Pain not controlled so came to ED. PMH - polio, paraparesis, HTN.    PT Comments  Pt received in supine, agreeable to therapy session after premedication for pain by RN, pt still reporting severe pain with long sitting/repositioning prior to OOB mobility, RN notified. Pt needing greatly increased time to initiate and perform transfer training due to anxiety and fear of pain and significant guarding. Pt performed partial roll toward her R side with +2 maxA to place pressure relief cushion under her hips, and was able to achieve supine to long sitting multiple reps with Supervision when pulling up on bed side rails. Pt needing up to maxA for lateral and posterior supine scooting in deflated air bed with cushion under hips and BLE to reduce pain and bed pad assist, but at times only light modA. Pt with excessive posterior lean at times when scooting, improves with cues, but benefits from cues for better body mechanics throughout. Pt agreeable to sit up in chair with BLE extended across chair/over EOB (chair locked with no gap between surfaces) at end of session, with LE/heels floated for comfort. Patient will benefit from continued inpatient follow up therapy, <3 hours/day, she is still requiring up to maxA for OOB transfers and lives alone at baseline, typically at modI level.     If plan is discharge home, recommend the following: Two people to help with walking and/or transfers;Two people to help with bathing/dressing/bathroom;Assistance with cooking/housework;Assist for transportation;Help with stairs or ramp for entrance   Can travel by private vehicle     No  Equipment Recommendations  Hoyer  lift (consider drop arm BSC and slide board if she does not have one)    Recommendations for Other Services       Precautions / Restrictions Precautions Precautions: Fall Recall of Precautions/Restrictions: Intact Precaution/Restrictions Comments: pain severely limiting her Required Braces or Orthoses: Knee Immobilizer - Right;Knee Immobilizer - Left Knee Immobilizer - Right: On at all times Knee Immobilizer - Left: On at all times Restrictions Weight Bearing Restrictions Per Provider Order: Yes RLE Weight Bearing Per Provider Order: Non weight bearing LLE Weight Bearing Per Provider Order: Non weight bearing     Mobility  Bed Mobility Overal bed mobility: Needs Assistance Bed Mobility: Rolling, Supine to Sit, Sit to Supine Rolling: Max assist, +2 for safety/equipment, Used rails   Supine to sit: Supervision, Used rails Sit to supine: Supervision, Used rails   General bed mobility comments: Rolling to R side for placement of air cushion under her hips with maxA prior to air bed being deflated for working on scooting OOB with more firm surface. Pt performs >5 transfers supine to long sitting with Supervision and use of bed rails/UE pushing through mattress to raise trunk.    Transfers Overall transfer level: Needs assistance Equipment used:  (bed pad assist) Transfers: Bed to chair/wheelchair/BSC         Anterior-Posterior transfers: Mod assist, Max assist  Lateral/Scoot Transfers: Mod assist, Max assist General transfer comment: Pt able to use BUE to assist with rotating LE toward her R and to lift/scoot hips toward her L with mod sequencing/safety cues and then for posterior scooting from deflated air bed to recliner chair  locked perpendicularly to her bed. Pt OK with leaving BLE (KI in place) on air bed (deflated) with geomat cushion and pillows under BLE to keep them slightly elevated and extended in front of her on bed surface. Pillow behind lower back in recliner for  comfort. Pt with posterior lean while scooting, needing at times maxA to prevent excessive trunk lean and at times, but as little as light modA for posterior scooting with BUE support.    Ambulation/Gait               General Gait Details: NT; pt NWB BLE   Stairs             Wheelchair Mobility     Tilt Bed    Modified Rankin (Stroke Patients Only)       Balance Overall balance assessment: Needs assistance Sitting-balance support: Bilateral upper extremity supported, Single extremity supported Sitting balance-Leahy Scale: Fair Sitting balance - Comments: with UE support, fair seated balance in long sitting. unsupported appears poor for dynamic tasks Postural control: Posterior lean     Standing balance comment: NT; NWB BLE                            Communication Communication Communication: No apparent difficulties Factors Affecting Communication: Other (comment) (verbose)  Cognition Arousal: Alert Behavior During Therapy: WFL for tasks assessed/performed, Anxious   PT - Cognitive impairments: No apparent impairments                       PT - Cognition Comments: Pt particular due to pain and having difficulty altering her typical movement techniques due to anxiety and pain with rolling. Pt appreciative of PTA taking extended time to assist her with repositioning while pt mobilizing/scooting OOB to chair, but PTA reinforced that when she returns to bed, staff will not likely have >45 mins to transfer her but with increased staff assist, they should be able to move her safely if she allows them to do more for her to be efficient due to weather related staffing shortages. Following commands: Intact      Cueing Cueing Techniques: Verbal cues, Gestural cues, Tactile cues  Exercises      General Comments General comments (skin integrity, edema, etc.): Pt reports no purewick available on floor; box is locked per nursing staff, RN aware; BP  144/87 (104) long sitting in bed, HR to 104 bpm while working on scooting and resting in long sit; pt appears clammy Prior to OOB, pt bil KI braces opened, slid up higher on legs for better stability, and R KI trimmed down for better fit. Pt may need L KI trimmed down width-wise but defer to next session due to pt pain and do not have time at end of session.      Pertinent Vitals/Pain Pain Assessment Pain Assessment: 0-10 Pain Score: 10-Worst pain ever Pain Location: Bil knees and thighs despite premedication and KI adjustment Pain Descriptors / Indicators: Grimacing, Guarding, Moaning, Sharp, Sore Pain Intervention(s): Limited activity within patient's tolerance, Monitored during session    Home Living                          Prior Function            PT Goals (current goals can now be found in the care plan section) Acute Rehab PT Goals Patient Stated Goal: return home independently PT  Goal Formulation: With patient Time For Goal Achievement: 02/21/24 Progress towards PT goals: Progressing toward goals    Frequency    Min 2X/week      PT Plan      Co-evaluation              AM-PAC PT 6 Clicks Mobility   Outcome Measure  Help needed turning from your back to your side while in a flat bed without using bedrails?: Total Help needed moving from lying on your back to sitting on the side of a flat bed without using bedrails?: A Little Help needed moving to and from a bed to a chair (including a wheelchair)?: A Lot Help needed standing up from a chair using your arms (e.g., wheelchair or bedside chair)?: Total Help needed to walk in hospital room?: Total Help needed climbing 3-5 steps with a railing? : Total 6 Click Score: 9    End of Session Equipment Utilized During Treatment: Left knee immobilizer;Right knee immobilizer;Other (comment) (bed pad assist) Activity Tolerance: Patient tolerated treatment well;Patient limited by pain;Other (comment)  (anxiety limiting efficiency) Patient left: with call bell/phone within reach;in chair;Other (comment) (pt chair locked facing bed without a gap between and pt BLE extended over bed surface with cushions underneath to keep LE elevated, heels floated, call bell/phones in reach) Nurse Communication: Mobility status;Patient requests pain meds;Precautions;Other (comment) (needs at least +2 to safely/efficiently scoot back, call PTA for assist) PT Visit Diagnosis: Other abnormalities of gait and mobility (R26.89);History of falling (Z91.81);Pain Pain - part of body: Knee;Leg;Hip (BLE)     Time: 8965-8864 PT Time Calculation (min) (ACUTE ONLY): 61 min  Charges:    $Therapeutic Activity: 53-67 mins PT General Charges $$ ACUTE PT VISIT: 1 Visit                     Miles Leyda P., PTA Acute Rehabilitation Services Secure Chat Preferred 9a-5:30pm Office: [phone]    [facility] 02/12/2024, 12:08 PM

## 2024-02-12 NOTE — Progress Notes (Signed)
 " PROGRESS NOTE  Marie Thomas FMW:[id number] DOB: 1945-04-26 DOA: 02/06/2024 PCP: Ileen Rosaline NOVAK, NP   LOS: 0 days   Brief narrative:  Marie Thomas is a 79 y.o. female with medical history significant of hypertension, poliomyelitis, paraplegia, wheelchair dependent, renal angiomyolipoma status post cryoablation in 2016 presented hospital with mechanical fall on 02/06/2024 while standing up from the wheelchair.  Patient was diagnosed of having bilateral distal femur fracture and was put on knee immobilizers and was told by the other facility that she was not a good candidate for surgery due to being paraplegic with history of Poliomyelitis.  She was prescribed oxycodone  but did not help so was brought into the hospital.  In the ED, patient was hypotensive.  Potassium was low at 3.4.  X-ray of left femur showing acute impacted mildly comminuted transverse fracture of the distal femoral metaphysis and moderate suprapatellar joint effusion with lipohemarthrosis.  X-ray of right femur showing acute impacted transverse fracture of the distal right femoral metaphysis and moderate suprapatellar joint effusion with lipohemarthrosis.  X-rays of bilateral tibia/fibula negative for fractures.  Patient was given Tylenol  and oxycodone .  Orthopedics consulted (Dr. Jerri) and patient was out of hospital for further evaluation and treatment.  Patient is awaiting for adequate pain control and awaiting for skilled nursing facility placement.    Assessment/Plan: Principal Problem:   Femur fracture (HCC)  Bilateral distal femur fractures Bilateral moderate suprapatellar joint effusion with lipohemarthrosis Ongoing pain. Status post mechanical fall 2 days back.  Orthopedics was consulted and at this time plan is immobilization and no surgical intervention.  No weightbearing advised.  PT/ OT recommended skilled nursing facility placement at this time.  Continues to have inadequately controlled pain so we will  adjust medication.  Will change oxycodone  every 4 hourly instead of 6 hourly and can increase the dose to 10 mg instead of 5 mg.  Has been changed to scheduled muscle relaxant.  Continue with Tylenol  and Motrin  as well.     Mild hypokalemia Resolved.  Potassium was 3.7.  Check lab in AM.   Essential hypertension Blood pressure seems to be stable at this time.  Continue lisinopril .  Still continues to have pain so has elevated blood pressure.  History of paraplegia from polio on wheelchair as outpatient.  Was able to transfer on her own at home.  Currently with bilateral fractures. PT OT recommends skilled nursing facility placement.  DVT prophylaxis: SCDs Start: 02/07/24 0531   Disposition: Skilled nursing facility as per PT recommendation.  Medically stable for disposition  Status is: Observation   Code Status:     Code Status: Full Code  Family Communication: None at bedside  Consultants: Orthopedics  Procedures: Bilateral lower extremity immobilizer  Anti-infectives:  None  Anti-infectives (From admission, onward)    None       Subjective: Today, patient was seen and examined at bedside.  Patient  states that her pain is not adequately controlled and wishes to adjust her medication.  Denies any nausea vomiting fever chills shortness of breath or dyspnea. Objective: Vitals:   02/11/24 2005 02/12/24 0553  BP: (!) 155/64 (!) 172/68  Pulse: 87 76  Resp: 18 18  Temp: 98.3 F (36.8 C) 97.6 F (36.4 C)  SpO2: 93% 97%   No intake or output data in the 24 hours ending 02/12/24 0951   There were no vitals filed for this visit. There is no height or weight on file to calculate BMI.   Physical Exam:  General:  Average built, not in obvious distress HENT:   No scleral pallor or icterus noted. Oral mucosa is moist.  Chest: Diminished breath sounds bilaterally CVS: S1 &S2 heard. No murmur.  Regular rate and rhythm. Abdomen: Soft, nontender, nondistended.  Bowel  sounds are heard.   Extremities: Lateral lower extremity on immobilizer's.  Paraplegic Psych: Alert, awake and oriented, normal mood CNS:  No cranial nerve deficits.  Paraplegia Skin: Warm and dry.  No rashes noted.  Data Review: I have personally reviewed the following laboratory data and studies,  CBC: Recent Labs  Lab 02/07/24 0048 02/07/24 0430 02/08/24 0453  WBC  --  9.6 8.6  NEUTROABS  --  7.2  --   HGB 14.6 14.6 13.5  HCT 43.0 44.9 42.2  MCV  --  92.4 93.2  PLT  --  207 201   Basic Metabolic Panel: Recent Labs  Lab 02/07/24 0048 02/07/24 0430 02/08/24 0453  NA 143 143 142  K 3.2* 3.4* 3.7  CL 106 105 107  CO2  --  26 24  GLUCOSE 184* 149* 121*  BUN 15 15 20   CREATININE 0.60 0.53 0.79  CALCIUM  --  8.9 9.1   Liver Function Tests: No results for input(s): AST, ALT, ALKPHOS, BILITOT, PROT, ALBUMIN in the last 168 hours. No results for input(s): LIPASE, AMYLASE in the last 168 hours. No results for input(s): AMMONIA in the last 168 hours. Cardiac Enzymes: No results for input(s): CKTOTAL, CKMB, CKMBINDEX, TROPONINI in the last 168 hours. BNP (last 3 results) No results for input(s): BNP in the last 8760 hours.  ProBNP (last 3 results) No results for input(s): PROBNP in the last 8760 hours.  CBG: No results for input(s): GLUCAP in the last 168 hours. No results found for this or any previous visit (from the past 240 hours).   Studies: No results found.     Vernal Alstrom, MD  [facility] 02/12/2024  If 7PM-7AM, please contact night-coverage             "

## 2024-02-13 ENCOUNTER — Observation Stay (HOSPITAL_COMMUNITY)

## 2024-02-13 DIAGNOSIS — S7290XA Unspecified fracture of unspecified femur, initial encounter for closed fracture: Secondary | ICD-10-CM | POA: Diagnosis not present

## 2024-02-13 LAB — BASIC METABOLIC PANEL WITH GFR
Anion gap: 10 (ref 5–15)
BUN: 17 mg/dL (ref 8–23)
CO2: 29 mmol/L (ref 22–32)
Calcium: 9.1 mg/dL (ref 8.9–10.3)
Chloride: 106 mmol/L (ref 98–111)
Creatinine, Ser: 0.47 mg/dL (ref 0.44–1.00)
GFR, Estimated: >60 mL/min
Glucose, Bld: 114 mg/dL — ABNORMAL HIGH (ref 70–99)
Potassium: 3.8 mmol/L (ref 3.5–5.1)
Sodium: 144 mmol/L (ref 135–145)

## 2024-02-13 LAB — CBC
HCT: 41.4 % (ref 36.0–46.0)
Hemoglobin: 13.3 g/dL (ref 12.0–15.0)
MCH: 29.4 pg (ref 26.0–34.0)
MCHC: 32.1 g/dL (ref 30.0–36.0)
MCV: 91.6 fL (ref 80.0–100.0)
Platelets: 234 10*3/uL (ref 150–400)
RBC: 4.52 MIL/uL (ref 3.87–5.11)
RDW: 15.3 % (ref 11.5–15.5)
WBC: 6.4 10*3/uL (ref 4.0–10.5)
nRBC: 0 % (ref 0.0–0.2)

## 2024-02-13 NOTE — Progress Notes (Addendum)
 " PROGRESS NOTE  ZARI CLY FMW:[id number] DOB: [birth year] DOA: 02/06/2024 PCP: Ileen Rosaline NOVAK, NP   LOS: 0 days   Brief narrative:  Marie Thomas is a 79 y.o. female with medical history significant of hypertension, poliomyelitis, paraplegia, wheelchair dependent, renal angiomyolipoma status post cryoablation in 2016 presented hospital with mechanical fall on 02/06/2024 while standing up from the wheelchair.  Patient was diagnosed of having bilateral distal femur fracture and was put on knee immobilizers and was told by the other facility that she was not a good candidate for surgery due to being paraplegic with history of Poliomyelitis.  She was prescribed oxycodone  but did not help so was brought into the hospital.  In the ED, patient was hypotensive.  Potassium was low at 3.4.  X-ray of left femur showing acute impacted mildly comminuted transverse fracture of the distal femoral metaphysis and moderate suprapatellar joint effusion with lipohemarthrosis.  X-ray of right femur showing acute impacted transverse fracture of the distal right femoral metaphysis and moderate suprapatellar joint effusion with lipohemarthrosis.  X-rays of bilateral tibia/fibula negative for fractures.  Patient was given Tylenol  and oxycodone .  Orthopedics consulted (Dr. Jerri) and patient was admitted to the hospital for further evaluation and treatment.  Patient is awaiting for adequate pain control, palliative surgical intervention and awaiting for skilled nursing facility placement.    Assessment/Plan: Principal Problem:   Femur fracture (HCC)  Bilateral distal femur fractures Bilateral moderate suprapatellar joint effusion with lipohemarthrosis Ongoing pain. Status post mechanical fall 2 days prior to presentation.  Orthopedics was consulted  initial plan was immobilization and no surgical intervention.  No weightbearing advised.  PT/ OT recommended skilled nursing facility placement but patient continued  to have severe uncontrolled pain even on minimal movement so orthopedics was re-consulted and plan is possible palliative surgery to relieve pain.  Currently on oxycodone  10 mg every 4 hourly with Tylenol  Motrin  and muscle relaxant.  Communicated with orthopedics.  Will keep n.p.o. after midnight for possible surgery tomorrow.  Mild hypokalemia Replenished and improved.  Latest potassium of 3.8   Essential hypertension Blood pressure seems to be stable at this time.  Continue lisinopril .  Still has some elevated blood pressure  History of paraplegia from polio on wheelchair as outpatient.  Was able to transfer on her own at home.  Currently with bilateral fractures. PT OT recommends skilled nursing facility placement.  DVT prophylaxis: SCDs Start: 02/07/24 0531   Disposition: Skilled nursing facility as per PT recommendation.   Status is: Observation, will need surgical intervention- inpatient  Status recommended   Code Status:     Code Status: Full Code  Family Communication:- called the patient's sister on the phone but was unable to reach out to her today.  Consultants: Orthopedics  Procedures: Bilateral lower extremity immobilizer  Anti-infectives:  None  Anti-infectives (From admission, onward)    None       Subjective: Today, patient was seen and examined at bedside.  Patient states pain is little better with current medication.  Denies any shortness of breath fever chills dyspnea nausea or vomiting.  Objective: Vitals:   02/13/24 0611 02/13/24 0842  BP: (!) 152/81 (!) 136/55  Pulse: 82 100  Resp: 18 16  Temp: (!) 97.5 F (36.4 C) 98.2 F (36.8 C)  SpO2: 100% 95%    Intake/Output Summary (Last 24 hours) at 02/13/2024 0953 Last data filed at 02/12/2024 1500 Gross per 24 hour  Intake 240 ml  Output --  Net 240 ml  There were no vitals filed for this visit. There is no height or weight on file to calculate BMI.   Physical Exam:  General:   Average built, not in obvious distress HENT:   No scleral pallor or icterus noted. Oral mucosa is moist.  Chest: Diminished breath sounds bilaterally, no wheezes or crackles. CVS: S1 &S2 heard. No murmur.  Regular rate and rhythm. Abdomen: Soft, nontender, nondistended.  Bowel sounds are heard.   Extremities: Bilateral lower extremity on immobilizer, paraplegia, Psych: Alert, awake and oriented, normal mood CNS:  No cranial nerve deficits.  Paraplegia Skin: Warm and dry.  No rashes noted.  Data Review: I have personally reviewed the following laboratory data and studies,  CBC: Recent Labs  Lab 02/07/24 0048 02/07/24 0430 02/08/24 0453 02/13/24 0617  WBC  --  9.6 8.6 6.4  NEUTROABS  --  7.2  --   --   HGB 14.6 14.6 13.5 13.3  HCT 43.0 44.9 42.2 41.4  MCV  --  92.4 93.2 91.6  PLT  --  207 201 234   Basic Metabolic Panel: Recent Labs  Lab 02/07/24 0048 02/07/24 0430 02/08/24 0453 02/13/24 0617  NA 143 143 142 144  K 3.2* 3.4* 3.7 3.8  CL 106 105 107 106  CO2  --  26 24 29   GLUCOSE 184* 149* 121* 114*  BUN 15 15 20 17   CREATININE 0.60 0.53 0.79 0.47  CALCIUM  --  8.9 9.1 9.1   Liver Function Tests: No results for input(s): AST, ALT, ALKPHOS, BILITOT, PROT, ALBUMIN in the last 168 hours. No results for input(s): LIPASE, AMYLASE in the last 168 hours. No results for input(s): AMMONIA in the last 168 hours. Cardiac Enzymes: No results for input(s): CKTOTAL, CKMB, CKMBINDEX, TROPONINI in the last 168 hours. BNP (last 3 results) No results for input(s): BNP in the last 8760 hours.  ProBNP (last 3 results) No results for input(s): PROBNP in the last 8760 hours.  CBG: No results for input(s): GLUCAP in the last 168 hours. No results found for this or any previous visit (from the past 240 hours).   Studies: No results found.     Aruna Nestler, MD  [facility] 02/13/2024  If 7PM-7AM, please contact  night-coverage             "

## 2024-02-13 NOTE — TOC Progression Note (Signed)
 Transition of Care [facility]) - Progression Note    Patient Details  Name: Marie Thomas MRN: [id number] Date of Birth: 03/05/1945  [facility] CM/SW Contact  Bridget Cordella Simmonds, LCSW Phone Number: 02/13/2024, 8:33 AM  Clinical Narrative:   Message from [facility]: no bed available today.    Expected Discharge Plan: Skilled Nursing Facility Barriers to Discharge: Continued Medical Work up               Expected Discharge Plan and Services   Discharge Planning Services: CM Consult   Living arrangements for the past 2 months: Single Family Home                                       Social Drivers of Health (SDOH) Interventions SDOH Screenings   Food Insecurity: No Food Insecurity (02/07/2024)  Housing: Low Risk (02/07/2024)  Transportation Needs: No Transportation Needs (02/07/2024)  Utilities: Not At Risk (02/07/2024)  Social Connections: Socially Isolated (02/07/2024)  Tobacco Use: Low Risk (02/06/2024)    Readmission Risk Interventions     No data to display

## 2024-02-13 NOTE — Progress Notes (Signed)
 OT Cancellation Note  Patient Details Name: Marie Thomas MRN: [id number] DOB: 1945-01-26   Cancelled Treatment:    Reason Eval/Treat Not Completed: Pain limiting ability to participate. OT to follow up next available time as appropriate. Per ortho, plan bil ORIF 1/28   Jacques Aquas [facility] 02/13/2024, 10:36 AM

## 2024-02-13 NOTE — Progress Notes (Signed)
 Patient ID: Marie Thomas, female   DOB: 02-15-1945, 79 y.o.   MRN: [id number]   Subjective: Struggling with pain with movement. Very reluctant to have surgery, is concerned about anesthesia, complications with the surgery, and possible future interactions with her brace.   Objective: Vital signs in last 24 hours: Temp:  [97.5 F (36.4 C)-98.8 F (37.1 C)] 98.2 F (36.8 C) (01/27 0842) Pulse Rate:  [70-104] 100 (01/27 0842) Resp:  [16-18] 16 (01/27 0842) BP: (98-152)/(55-87) 136/55 (01/27 0842) SpO2:  [94 %-100 %] 95 % (01/27 0842) Last BM Date : 02/12/24   Laboratory  CBC Recent Labs    02/13/24 0617  WBC 6.4  HGB 13.3  HCT 41.4  PLT 234   BMET Recent Labs    02/13/24 0617  NA 144  K 3.8  CL 106  CO2 29  GLUCOSE 114*  BUN 17  CREATININE 0.47  CALCIUM 9.1     Physical Exam General appearance: alert and no distress   Assessment/Plan: Bilateral distal femur fxs -- Will talk with hospitalist about increasing pain regimen, possibly adding some adjunct medications. We can revisit surgery toward the end of the week if she's still struggling.    Ozell DOROTHA Ned, PA-C Orthopedic Surgery [phone] 02/13/2024

## 2024-02-14 ENCOUNTER — Encounter: Payer: Self-pay | Attending: Emergency Medicine

## 2024-02-14 ENCOUNTER — Encounter (HOSPITAL_COMMUNITY): Payer: Self-pay | Admitting: Certified Registered"

## 2024-02-14 DIAGNOSIS — S7290XA Unspecified fracture of unspecified femur, initial encounter for closed fracture: Secondary | ICD-10-CM | POA: Diagnosis not present

## 2024-02-14 MED ORDER — OXYCODONE HCL 10 MG PO TABS: 10.0000 mg | ORAL_TABLET | ORAL | 0 refills | Status: AC | PRN

## 2024-02-14 MED ORDER — HYDROMORPHONE HCL 1 MG/ML IJ SOLN
0.5000 mg | Freq: Once | INTRAMUSCULAR | Status: AC
  Administered 2024-02-14: 0.5 mg via INTRAVENOUS
  Filled 2024-02-14: qty 0.5

## 2024-02-14 NOTE — Progress Notes (Signed)
 Physical Therapy Treatment Patient Details Name: Marie Thomas MRN: [id number] DOB: 01/30/1945 Today's Date: 02/14/2024   History of Present Illness Pt is 79 year old presented to [facility] on  02/06/24 for bilateral femur fx. Pt had been seen at another hospital and placed in knee immobilizers and dc'd home. Pain not controlled so came to ED. PMH - polio, paraparesis, HTN.    PT Comments  Pt received in supine after premedication by RN with Tylenol  and Flexeril . Pt reports severe >10/10 pain with air bed deflated (prior to attempts at scooting or repositioning), deflation was to allow her to more efficiently scoot in bed as she had done in prior session. Pt reports current pain regimen not sufficient to allow scooting more than a few inches with attempts at posterior seated scooting in long sitting once air bed reinflated. RN notified pt requesting bed pad changed after premedication and that meds be timed to allow for bath/linen change and ambulance transport with less pain, unsure what time pt transport will be arriving. Per chart review, pt has refused LE ORIF, she will need better pain control to tolerate therapies at next setting. Patient will benefit from continued inpatient follow up therapy, <3 hours/day     If plan is discharge home, recommend the following: Two people to help with walking and/or transfers;Two people to help with bathing/dressing/bathroom;Assistance with cooking/housework;Assist for transportation;Help with stairs or ramp for entrance   Can travel by private vehicle     No  Equipment Recommendations  Hoyer lift (consider drop arm BSC and slide board if she does not have one)    Recommendations for Other Services       Precautions / Restrictions Precautions Precautions: Fall Recall of Precautions/Restrictions: Intact Precaution/Restrictions Comments: pain severely limiting her, LLE distal femur>RLE Required Braces or Orthoses: Knee Immobilizer - Right;Knee  Immobilizer - Left Knee Immobilizer - Right: On at all times Knee Immobilizer - Left: On at all times Restrictions Weight Bearing Restrictions Per Provider Order: Yes RLE Weight Bearing Per Provider Order: Non weight bearing LLE Weight Bearing Per Provider Order: Non weight bearing     Mobility  Bed Mobility Overal bed mobility: Needs Assistance Bed Mobility: Supine to Sit, Sit to Supine     Supine to sit: Supervision, Used rails, HOB elevated Sit to supine: Supervision, Used rails, HOB elevated   General bed mobility comments: pt using bed rails to raise/lower trunk with heavy effort; w/o rails, would need HHA to raise trunk    Transfers Overall transfer level: Needs assistance Equipment used:  (bed pad assist) Transfers: Bed to chair/wheelchair/BSC         Anterior-Posterior transfers: Mod assist, Min assist   General transfer comment: Very minimal posterior hip scooting in long sitting in air bed. PTA attempted to lower air as had been done in prior session to allow her to move more efficiently, but pt reports LLE distal femur pain is >10/10 with air bed deflated and requests it be reinflated in order to complete scooting. <54ft distance achieved wtih tiny propulsion scoots of 1-2 inches each side at a time due to pt pain/guarding. min to modA bed pad assist, but pt does not tolerate heavier assist with bed pad as offered due to pain/guarding. Pt unable to tolerate further OOB mobility attempts due to pain and pt requesting premedication wtih stronger meds, RN notified.    Ambulation/Gait               General Gait Details: NT; pt NWB  BLE   Stairs             Wheelchair Mobility     Tilt Bed    Modified Rankin (Stroke Patients Only)       Balance Overall balance assessment: Needs assistance Sitting-balance support: Bilateral upper extremity supported, Single extremity supported Sitting balance-Leahy Scale: Fair (Fair-static sitting; Poor  -dynamic) Sitting balance - Comments: with UE support, fair seated balance in long sitting. unsupported appears poor for dynamic tasks Postural control: Posterior lean     Standing balance comment: NT; NWB BLE                            Communication Communication Communication: No apparent difficulties Factors Affecting Communication: Other (comment) (verbose)  Cognition Arousal: Alert Behavior During Therapy: WFL for tasks assessed/performed, Anxious   PT - Cognitive impairments: No apparent impairments                       PT - Cognition Comments: Pt particular due to pain and having difficulty altering her typical movement techniques due to anxiety and pain with rolling. Per chart review, surgeon offered fixation for her but she decided to decline. Pain is similar this date to prior session, now plan for DC and pt appropriately asking PTA to notify RN of her pain so they can premedicate her again before ambulance transport and bed linen change. Following commands: Intact      Cueing Cueing Techniques: Verbal cues, Gestural cues, Tactile cues  Exercises      General Comments General comments (skin integrity, edema, etc.): Pt reports bed pad under her has not been changed since possibly Monday due to pain. She worries about skin integrity near tops of the knee immobilizers due to moisture from bed pad/purewick leaking. RN notified of pt request for premedication before ambulance transport and pad change.      Pertinent Vitals/Pain Pain Assessment Pain Assessment: 0-10 Pain Score: 10-Worst pain ever Pain Location: L>R distal femur/knee Pain Descriptors / Indicators: Grimacing, Guarding, Moaning, Sharp, Sore, Discomfort (screaming) Pain Intervention(s): Limited activity within patient's tolerance, Monitored during session, Premedicated before session, Repositioned, Patient requesting pain meds-RN notified, Ice applied    Home Living                           Prior Function            PT Goals (current goals can now be found in the care plan section) Acute Rehab PT Goals Patient Stated Goal: Less pain so I can move easier and then go home by myself. PT Goal Formulation: With patient Time For Goal Achievement: 02/21/24 Progress towards PT goals: Not progressing toward goals - comment (due to pain/guarding)    Frequency    Min 2X/week      PT Plan      Co-evaluation              AM-PAC PT 6 Clicks Mobility   Outcome Measure  Help needed turning from your back to your side while in a flat bed without using bedrails?: Total Help needed moving from lying on your back to sitting on the side of a flat bed without using bedrails?: A Little Help needed moving to and from a bed to a chair (including a wheelchair)?: Total (+2 assist and extended time to scoot) Help needed standing up from a chair using your arms (  e.g., wheelchair or bedside chair)?: Total Help needed to walk in hospital room?: Total Help needed climbing 3-5 steps with a railing? : Total 6 Click Score: 8    End of Session Equipment Utilized During Treatment: Left knee immobilizer;Right knee immobilizer;Other (comment) (bed pad assist) Activity Tolerance: Patient limited by pain;Other (comment) (anxiety surrounding pain/fear also a limitation) Patient left: with call bell/phone within reach;Other (comment);in bed;with bed alarm set (air bed reinflated, BLE elevated on pillows for pt comfort/edema mgmt, ice pack obtained for her after session) Nurse Communication: Mobility status;Patient requests pain meds;Precautions;Other (comment);Need for lift equipment (needs at least +2 to safely/efficiently scoot A/P or to roll, pain not controlled per pt report at time of session) PT Visit Diagnosis: Other abnormalities of gait and mobility (R26.89);History of falling (Z91.81);Pain Pain - Right/Left: Left Pain - part of body: Knee;Leg (L>R)     Time:  8872-8856 PT Time Calculation (min) (ACUTE ONLY): 16 min  Charges:    $Therapeutic Activity: 8-22 mins PT General Charges $$ ACUTE PT VISIT: 1 Visit                     Marie Thomas P., PTA Acute Rehabilitation Services Secure Chat Preferred 9a-5:30pm Office: [phone]    Connell HERO [facility] 02/14/2024, 12:01 PM

## 2024-02-14 NOTE — Discharge Summary (Signed)
 Physician Discharge Summary  Marie Thomas FMW:[id number] DOB: [birth year] DOA: 02/06/2024  PCP: Ileen Rosaline NOVAK, NP  Admit date: 02/06/2024 Discharge date: 02/14/2024    Admitted From: Home Disposition: SNF  Recommendations for Outpatient Follow-up:  Follow up with PCP in 1-2 weeks Please obtain BMP/CBC in one week Follow-up with orthopedics in 2 to 3 weeks. Please follow up with your PCP on the following pending results: Unresulted Labs (From admission, onward)    None         Home Health: None Equipment/Devices: None  Discharge Condition: Stable CODE STATUS: Full code Diet recommendation:  Diet Order             Diet regular Fluid consistency: Thin  Diet effective now                   Subjective: Patient seen and examined, she says that her bilateral leg pain is much better today and tolerable, 3 out of 10, she has decided not to go with the surgery and she would like to go to SNF today.  Brief/Interim Summary: Marie Thomas is a 79 y.o. female with medical history significant of hypertension, poliomyelitis, paraplegia, wheelchair dependent, renal angiomyolipoma status post cryoablation in 2016 presented hospital with mechanical fall on 02/06/2024 while standing up from the wheelchair.  Patient was diagnosed of having bilateral distal femur fracture and was put on knee immobilizers and was told by the other facility that she was not a good candidate for surgery due to being paraplegic with history of Poliomyelitis.  She was prescribed oxycodone  but did not help so was brought into the hospital.  In the ED, patient was hypotensive.  Potassium was low at 3.4.  X-ray of left femur showing acute impacted mildly comminuted transverse fracture of the distal femoral metaphysis and moderate suprapatellar joint effusion with lipohemarthrosis.  X-ray of right femur showing acute impacted transverse fracture of the distal right femoral metaphysis and moderate  suprapatellar joint effusion with lipohemarthrosis.  X-rays of bilateral tibia/fibula negative for fractures.  Patient was given Tylenol  and oxycodone .  Orthopedics consulted (Dr. Jerri) and patient was admitted to the hospital for further evaluation and treatment.  Details below.    Bilateral distal femur fractures Bilateral moderate suprapatellar joint effusion with lipohemarthrosis Ongoing pain. Status post mechanical fall 2 days prior to presentation.  Orthopedics was consulted  initial plan was immobilization and no surgical intervention.  No weightbearing advised.  PT/ OT recommended skilled nursing facility placement but patient continued to have severe uncontrolled pain even on minimal movement so orthopedics was re-consulted and due to uncontrolled pain, they recommended palliative surgery to relieve pain however patient declined surgery.  In last 24 hours, patient's pain is much better controlled and now she has decided to forego surgery and she has decided to go to nursing home/SNF for rehabilitation.  I prescribed her oxycodone  for pain control.  Recommend follow-up with orthopedics as outpatient in case her pain remains uncontrolled.   Mild hypokalemia Resolved.   Essential hypertension Blood pressure seems to be stable at this time.  Resume home medications.   History of paraplegia from polio on wheelchair as outpatient.  Was able to transfer on her own at home.  Currently with bilateral fractures. PT OT recommends skilled nursing facility placement.  She has been discharged to SNF.  Discharge Diagnoses:  Principal Problem:   Femur fracture [facility])    Discharge Instructions   Allergies as of 02/14/2024   No Known Allergies  Medication List     STOP taking these medications    fluconazole  150 MG tablet Commonly known as: Diflucan    HYDROcodone -acetaminophen  5-325 MG tablet Commonly known as: Norco       TAKE these medications    acetaminophen  500 MG  tablet Commonly known as: TYLENOL  Take 1,000 mg by mouth daily as needed for mild pain (pain score 1-3).   lisinopril  20 MG tablet Commonly known as: ZESTRIL  Take 1 tablet (20 mg total) by mouth daily.   Oxycodone  HCl 10 MG Tabs Take 1 tablet (10 mg total) by mouth every 4 (four) hours as needed for severe pain (pain score 7-10). What changed:  medication strength how much to take when to take this reasons to take this   TURMERIC PO Take 1 capsule by mouth daily.   VITAMIN B12 PO Take 1 tablet by mouth daily.   Vitamin D 50 MCG (2000 UT) tablet Take 2,000 Units by mouth daily.        Follow-up Information     Schmerge, Rosaline NOVAK, NP Follow up in 1 week(s).   Specialty: Nurse Practitioner Contact information: [address] [place] [address] [phone]         Jerri Kay HERO, MD Follow up in 2 week(s).   Specialty: Orthopedic Surgery Contact information: [address] [place]  [place] [address] [phone]                Allergies[1]  Consultations: Orthopedics   Procedures/Studies: DG Knee Left Port Result Date: 02/13/2024 CLINICAL DATA:  Closed displaced supracondylar fracture of the distal left femur with intracondylar extension EXAM: PORTABLE LEFT KNEE - 1-2 VIEW COMPARISON:  Prior radiographs 02/07/2024 FINDINGS: Impacted supracondylar fracture of the distal femur with extension into the intercondylar notch. Associated large lipohemarthrosis with inferior displacement of the patella (patella baja). The bones appear diffusely demineralized. IMPRESSION: 1. Impacted supracondylar fracture with intercondylar extension to the intercondylar notch. 2. Associated large lipohemarthrosis with inferior displacement of the patella (patella baja). Disruption or partial disruption of the quadriceps tendon is not excluded. 3. The bones appear diffusely demineralized suggesting osteoporosis. Electronically Signed   By: Wilkie Lent M.D.   On:  02/13/2024 12:26   DG Knee Right Port Result Date: 02/13/2024 CLINICAL DATA:  Known closed displaced supracondylar fracture of the distal end of the left femur with intracondylar extension EXAM: DG KNEE 1-2V PORT*R* COMPARISON:  Prior radiographs 02/07/2024 FINDINGS: The bones appear diffusely demineralized. There is an impacted supracondylar fracture of the distal left femur which appears to extend to the intercondylar notch. Large associated lipohemarthrosis. Inferior displacement of the patella (patella Webster) concerning for possible disruption of the quadriceps tendon. IMPRESSION: Impacted supracondylar fracture of the distal left femur which appears to extend to the intercondylar notch. Inferior displacement of the patella concerning for possible disruption of the quadriceps tendon. Although, the patella may simply be displaced by mass effect from the associated large lipohemarthrosis. Electronically Signed   By: Wilkie Lent M.D.   On: 02/13/2024 12:24   DG Femur Min 2 Views Right Result Date: 02/07/2024 EXAM: 2 VIEW(S) XRAY OF THE RIGHT FEMUR 02/07/2024 02:24:00 AM COMPARISON: None available. CLINICAL HISTORY: Fall fall fall FINDINGS: BONES AND JOINTS: Acute impacted, transverse fracture of the distal right femoral metaphysis. Moderate suprapatellar joint effusion with lipohemarthrosis. SOFT TISSUES: Unremarkable. IMPRESSION: 1. Acute impacted transverse fracture of the distal right femoral metaphysis. 2. Moderate suprapatellar joint effusion with lipohemarthrosis. Electronically signed by: Pinkie Pebbles MD 02/07/2024 02:29 AM EST RP  Workstation: [id number]   DG Femur Min 2 Views Left Result Date: 02/07/2024 EXAM: 2 Views XRAY OF THE LEFT Femur 02/07/2024 02:24:00 AM COMPARISON: None available. CLINICAL HISTORY: Fall FINDINGS: BONES AND JOINTS: Acute impacted, transverse fracture of the distal femoral metaphysis, mildly comminuted. Moderate suprapatellar joint effusion with lipohemarthrosis.  SOFT TISSUES: Unremarkable. IMPRESSION: 1. Acute impacted, mildly comminuted transverse fracture of the distal femoral metaphysis. 2. Moderate suprapatellar joint effusion with lipohemarthrosis. Electronically signed by: Pinkie Pebbles MD 02/07/2024 02:27 AM EST RP Workstation: [id number]   DG Tibia/Fibula Left Result Date: 02/07/2024 EXAM: 2 VIEW(S) Xray of the left tibia and fibula 02/07/2024 02:21:00 AM COMPARISON: None available. CLINICAL HISTORY: Fall FINDINGS: BONES AND JOINTS: No acute fracture. No malalignment. SOFT TISSUES: Unremarkable. IMPRESSION: 1. No significant abnormality. Electronically signed by: Pinkie Pebbles MD 02/07/2024 02:25 AM EST RP Workstation: [id number]   DG Tibia/Fibula Right Result Date: 02/07/2024 EXAM: 2 VIEW(S) Xray of the right tibia and fibula 02/07/2024 02:21:00 AM COMPARISON: None available. CLINICAL HISTORY: Fall FINDINGS: BONES AND JOINTS: No acute fracture. No malalignment. SOFT TISSUES: Unremarkable. IMPRESSION: 1. No significant abnormality. Electronically signed by: Pinkie Pebbles MD 02/07/2024 02:24 AM EST RP Workstation: [id number]     Discharge Exam: Vitals:   02/14/24 0355 02/14/24 0751  BP: (!) 141/79 (!) 152/68  Pulse: 71 70  Resp: 18 16  Temp: (!) 97.5 F (36.4 C) 97.8 F (36.6 C)  SpO2: 98% 99%   Vitals:   02/13/24 1520 02/13/24 1946 02/14/24 0355 02/14/24 0751  BP: (!) 143/72 (!) 118/54 (!) 141/79 (!) 152/68  Pulse: 80 81 71 70  Resp: 18 17 18 16   Temp: 98.5 F (36.9 C) 98 F (36.7 C) (!) 97.5 F (36.4 C) 97.8 F (36.6 C)  TempSrc: Oral Oral Oral Oral  SpO2: 96% 95% 98% 99%    General: Pt is alert, awake, not in acute distress Cardiovascular: RRR, S1/S2 +, no rubs, no gallops Respiratory: CTA bilaterally, no wheezing, no rhonchi Abdominal: Soft, NT, ND, bowel sounds + Extremities: no edema, no cyanosis    The results of significant diagnostics from this hospitalization (including imaging, microbiology, ancillary and  laboratory) are listed below for reference.     Microbiology: No results found for this or any previous visit (from the past 240 hours).   Labs: BNP (last 3 results) No results for input(s): BNP in the last 8760 hours. Basic Metabolic Panel: Recent Labs  Lab 02/08/24 0453 02/13/24 0617  NA 142 144  K 3.7 3.8  CL 107 106  CO2 24 29  GLUCOSE 121* 114*  BUN 20 17  CREATININE 0.79 0.47  CALCIUM 9.1 9.1   Liver Function Tests: No results for input(s): AST, ALT, ALKPHOS, BILITOT, PROT, ALBUMIN in the last 168 hours. No results for input(s): LIPASE, AMYLASE in the last 168 hours. No results for input(s): AMMONIA in the last 168 hours. CBC: Recent Labs  Lab 02/08/24 0453 02/13/24 0617  WBC 8.6 6.4  HGB 13.5 13.3  HCT 42.2 41.4  MCV 93.2 91.6  PLT 201 234   Cardiac Enzymes: No results for input(s): CKTOTAL, CKMB, CKMBINDEX, TROPONINI in the last 168 hours. BNP: Invalid input(s): POCBNP CBG: No results for input(s): GLUCAP in the last 168 hours. D-Dimer No results for input(s): DDIMER in the last 72 hours. Hgb A1c No results for input(s): HGBA1C in the last 72 hours. Lipid Profile No results for input(s): CHOL, HDL, LDLCALC, TRIG, CHOLHDL, LDLDIRECT in the last 72 hours. Thyroid function studies No results for input(s):  TSH, T4TOTAL, T3FREE, THYROIDAB in the last 72 hours.  Invalid input(s): FREET3 Anemia work up No results for input(s): VITAMINB12, FOLATE, FERRITIN, TIBC, IRON, RETICCTPCT in the last 72 hours. Urinalysis    Component Value Date/Time   COLORURINE RED (A) 11/13/2013 0509   APPEARANCEUR TURBID (A) 11/13/2013 0509   LABSPEC 1.017 11/13/2013 0509   PHURINE 5.0 11/13/2013 0509   GLUCOSEU NEGATIVE 11/13/2013 0509   HGBUR LARGE (A) 11/13/2013 0509   BILIRUBINUR MODERATE (A) 11/13/2013 0509   KETONESUR 15 (A) 11/13/2013 0509   PROTEINUR 100 (A) 11/13/2013 0509   UROBILINOGEN 0.2  11/13/2013 0509   NITRITE NEGATIVE 11/13/2013 0509   LEUKOCYTESUR SMALL (A) 11/13/2013 0509   Sepsis Labs Recent Labs  Lab 02/08/24 0453 02/13/24 0617  WBC 8.6 6.4   Microbiology No results found for this or any previous visit (from the past 240 hours).  FURTHER DISCHARGE INSTRUCTIONS:   Get Medicines reviewed and adjusted: Please take all your medications with you for your next visit with your Primary MD   Laboratory/radiological data: Please request your Primary MD to go over all hospital tests and procedure/radiological results at the follow up, please ask your Primary MD to get all Hospital records sent to his/her office.   In some cases, they will be blood work, cultures and biopsy results pending at the time of your discharge. Please request that your primary care M.D. goes through all the records of your hospital data and follows up on these results.   Also Note the following: If you experience worsening of your admission symptoms, develop shortness of breath, life threatening emergency, suicidal or homicidal thoughts you must seek medical attention immediately by calling 911 or calling your MD immediately  if symptoms less severe.   You must read complete instructions/literature along with all the possible adverse reactions/side effects for all the Medicines you take and that have been prescribed to you. Take any new Medicines after you have completely understood and accpet all the possible adverse reactions/side effects.    patient was instructed, not to drive, operate heavy machinery, perform activities at heights, swimming or participation in water activities or provide baby-sitting services while on Pain, Sleep and Anxiety Medications; until their outpatient Physician has advised to do so again. Also recommended to not to take more than prescribed Pain, Sleep and Anxiety Medications.  It is not advisable to combine anxiety, sleep and pain medications without talking with your  primary care provider.     Wear Seat belts while driving.   Please note: You were cared for by a hospitalist during your hospital stay. Once you are discharged, your primary care physician will handle any further medical issues. Please note that NO REFILLS for any discharge medications will be authorized once you are discharged, as it is imperative that you return to your primary care physician (or establish a relationship with a primary care physician if you do not have one) for your post hospital discharge needs so that they can reassess your need for medications and monitor your lab values  Time coordinating discharge: Over 30 minutes  SIGNED:   Fredia Skeeter, MD  [facility] 02/14/2024, 10:38 AM *Please note that this is a verbal dictation therefore any spelling or grammatical errors are due to the Dragon Medical One system interpretation. If 7PM-7AM, please contact night-coverage [email or web address]     [1] No Known Allergies

## 2024-02-14 NOTE — Plan of Care (Signed)
  Problem: Activity: Goal: Risk for activity intolerance will decrease Outcome: Not Progressing   Problem: Elimination: Goal: Will not experience complications related to bowel motility Outcome: Not Progressing   Problem: Pain Managment: Goal: General experience of comfort will improve and/or be controlled Outcome: Not Progressing   Problem: Safety: Goal: Ability to remain free from injury will improve Outcome: Not Progressing   Problem: Skin Integrity: Goal: Risk for impaired skin integrity will decrease Outcome: Not Progressing

## 2024-02-14 NOTE — Progress Notes (Signed)
 Report given to nurse at receiving facility.

## 2024-02-14 NOTE — TOC Transition Note (Addendum)
 Transition of Care [facility]) - Discharge Note   Patient Details  Name: Marie Thomas MRN: [id number] Date of Birth: Jun 09, 1945  [facility] CM/SW Contact:  Bridget Cordella Simmonds, LCSW Phone Number: 02/14/2024, 12:15 PM   Clinical Narrative:   Pt discharging to [facility], room 506. RN call report to [phone].   PTAR called 1210.    1000: CSW confirmed with Nikki/[facility]: bed available today. MD notified.   Final next level of care: Skilled Nursing Facility Barriers to Discharge: Barriers Resolved   Patient Goals and CMS Choice     Choice offered to / list presented to : Patient      Discharge Placement              Patient chooses bed at: [facility] Patient to be transferred to facility by: ptar Name of family member notified: niece [name] Patient and family notified of of transfer: 02/14/24  Discharge Plan and Services Additional resources added to the After Visit Summary for     Discharge Planning Services: CM Consult                                 Social Drivers of Health (SDOH) Interventions SDOH Screenings   Food Insecurity: No Food Insecurity (02/07/2024)  Housing: Low Risk (02/07/2024)  Transportation Needs: No Transportation Needs (02/07/2024)  Utilities: Not At Risk (02/07/2024)  Social Connections: Socially Isolated (02/07/2024)  Tobacco Use: Low Risk (02/06/2024)     Readmission Risk Interventions     No data to display

## 2024-02-21 ENCOUNTER — Ambulatory Visit: Admitting: Physician Assistant
